# Patient Record
Sex: Male | Born: 1965
Health system: Southern US, Community
[De-identification: ages and names within clinical notes are randomized; demographics above are authoritative.]

## PROBLEM LIST (undated history)

## (undated) DIAGNOSIS — H4010X0 Unspecified open-angle glaucoma, stage unspecified: Secondary | ICD-10-CM

## (undated) DIAGNOSIS — L718 Other rosacea: Secondary | ICD-10-CM

## (undated) DIAGNOSIS — K4020 Bilateral inguinal hernia, without obstruction or gangrene, not specified as recurrent: Secondary | ICD-10-CM

## (undated) DIAGNOSIS — F79 Unspecified intellectual disabilities: Secondary | ICD-10-CM

## (undated) DIAGNOSIS — E785 Hyperlipidemia, unspecified: Secondary | ICD-10-CM

## (undated) DIAGNOSIS — IMO0001 Reserved for inherently not codable concepts without codable children: Principal | ICD-10-CM

## (undated) DIAGNOSIS — E663 Overweight: Secondary | ICD-10-CM

## (undated) DIAGNOSIS — K219 Gastro-esophageal reflux disease without esophagitis: Secondary | ICD-10-CM

## (undated) DIAGNOSIS — I10 Essential (primary) hypertension: Secondary | ICD-10-CM

## (undated) HISTORY — DX: Hyperlipidemia, unspecified: E78.5

## (undated) HISTORY — DX: Unspecified open-angle glaucoma, stage unspecified: H40.10X0

## (undated) HISTORY — DX: Essential (primary) hypertension: I10

## (undated) HISTORY — DX: Unspecified intellectual disabilities: F79

## (undated) HISTORY — DX: Other rosacea: L71.8

## (undated) HISTORY — DX: Gastro-esophageal reflux disease without esophagitis: K21.9

## (undated) HISTORY — DX: Overweight: E66.3

## (undated) HISTORY — DX: Bilateral inguinal hernia, without obstruction or gangrene, not specified as recurrent: K40.20

## (undated) HISTORY — DX: Reserved for inherently not codable concepts without codable children: IMO0001

---
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
## 1999-10-31 ENCOUNTER — Encounter: Admission: RE | Admit: 1999-10-31 | Discharge: 1999-10-31 | Payer: Self-pay | Admitting: Internal Medicine

## 1999-11-03 ENCOUNTER — Encounter: Admission: RE | Admit: 1999-11-03 | Discharge: 1999-11-03 | Payer: Self-pay | Admitting: Internal Medicine

## 2003-08-04 ENCOUNTER — Encounter: Admission: RE | Admit: 2003-08-04 | Discharge: 2003-08-04 | Payer: Self-pay | Admitting: Internal Medicine

## 2003-09-07 ENCOUNTER — Encounter: Admission: RE | Admit: 2003-09-07 | Discharge: 2003-09-07 | Payer: Self-pay | Admitting: Internal Medicine

## 2003-09-21 ENCOUNTER — Encounter: Admission: RE | Admit: 2003-09-21 | Discharge: 2003-09-21 | Payer: Self-pay | Admitting: Internal Medicine

## 2003-10-05 ENCOUNTER — Encounter: Admission: RE | Admit: 2003-10-05 | Discharge: 2003-10-05 | Payer: Self-pay | Admitting: Internal Medicine

## 2003-12-15 ENCOUNTER — Encounter: Admission: RE | Admit: 2003-12-15 | Discharge: 2003-12-15 | Payer: Self-pay | Admitting: Internal Medicine

## 2003-12-18 ENCOUNTER — Encounter: Admission: RE | Admit: 2003-12-18 | Discharge: 2003-12-18 | Payer: Self-pay | Admitting: Internal Medicine

## 2004-01-04 ENCOUNTER — Encounter: Admission: RE | Admit: 2004-01-04 | Discharge: 2004-01-04 | Payer: Self-pay | Admitting: Internal Medicine

## 2004-01-19 ENCOUNTER — Encounter: Admission: RE | Admit: 2004-01-19 | Discharge: 2004-01-19 | Payer: Self-pay | Admitting: Internal Medicine

## 2004-03-28 ENCOUNTER — Encounter: Admission: RE | Admit: 2004-03-28 | Discharge: 2004-03-28 | Payer: Self-pay | Admitting: Internal Medicine

## 2004-05-16 ENCOUNTER — Encounter: Admission: RE | Admit: 2004-05-16 | Discharge: 2004-05-16 | Payer: Self-pay | Admitting: Internal Medicine

## 2005-01-23 ENCOUNTER — Ambulatory Visit: Payer: Self-pay | Admitting: Internal Medicine

## 2005-06-02 ENCOUNTER — Ambulatory Visit: Payer: Self-pay | Admitting: Internal Medicine

## 2005-06-30 ENCOUNTER — Ambulatory Visit: Payer: Self-pay | Admitting: Internal Medicine

## 2005-07-07 ENCOUNTER — Ambulatory Visit: Payer: Self-pay | Admitting: Internal Medicine

## 2005-07-17 ENCOUNTER — Ambulatory Visit: Payer: Self-pay | Admitting: Internal Medicine

## 2005-07-21 ENCOUNTER — Ambulatory Visit: Payer: Self-pay | Admitting: Internal Medicine

## 2005-09-22 ENCOUNTER — Encounter (INDEPENDENT_AMBULATORY_CARE_PROVIDER_SITE_OTHER): Payer: Self-pay | Admitting: Internal Medicine

## 2005-09-22 ENCOUNTER — Ambulatory Visit: Payer: Self-pay | Admitting: Internal Medicine

## 2006-04-24 ENCOUNTER — Ambulatory Visit: Payer: Self-pay | Admitting: Internal Medicine

## 2006-08-18 DIAGNOSIS — E1129 Type 2 diabetes mellitus with other diabetic kidney complication: Secondary | ICD-10-CM | POA: Insufficient documentation

## 2006-08-18 DIAGNOSIS — I10 Essential (primary) hypertension: Secondary | ICD-10-CM

## 2006-08-18 DIAGNOSIS — IMO0001 Reserved for inherently not codable concepts without codable children: Secondary | ICD-10-CM

## 2006-08-18 DIAGNOSIS — K4020 Bilateral inguinal hernia, without obstruction or gangrene, not specified as recurrent: Secondary | ICD-10-CM | POA: Insufficient documentation

## 2006-08-18 DIAGNOSIS — R809 Proteinuria, unspecified: Secondary | ICD-10-CM

## 2006-08-18 DIAGNOSIS — F79 Unspecified intellectual disabilities: Secondary | ICD-10-CM

## 2006-08-18 HISTORY — DX: Essential (primary) hypertension: I10

## 2006-08-18 HISTORY — DX: Unspecified intellectual disabilities: F79

## 2006-08-18 HISTORY — DX: Reserved for inherently not codable concepts without codable children: IMO0001

## 2006-11-16 ENCOUNTER — Telehealth: Payer: Self-pay | Admitting: *Deleted

## 2006-11-19 ENCOUNTER — Ambulatory Visit: Payer: Self-pay | Admitting: Hospitalist

## 2006-11-19 LAB — CONVERTED CEMR LAB
Blood Glucose, Fingerstick: 125
Glucose, Bld: 125 mg/dL
Hgb A1c MFr Bld: 6.4 %

## 2006-12-24 ENCOUNTER — Telehealth: Payer: Self-pay | Admitting: *Deleted

## 2006-12-26 ENCOUNTER — Encounter (INDEPENDENT_AMBULATORY_CARE_PROVIDER_SITE_OTHER): Payer: Self-pay | Admitting: Internal Medicine

## 2006-12-26 ENCOUNTER — Ambulatory Visit: Payer: Self-pay | Admitting: *Deleted

## 2006-12-26 LAB — CONVERTED CEMR LAB: Cholesterol: 141 mg/dL (ref 0–200)

## 2007-02-25 ENCOUNTER — Telehealth (INDEPENDENT_AMBULATORY_CARE_PROVIDER_SITE_OTHER): Payer: Self-pay | Admitting: *Deleted

## 2007-03-08 ENCOUNTER — Ambulatory Visit: Payer: Self-pay | Admitting: Hospitalist

## 2007-03-08 ENCOUNTER — Encounter (INDEPENDENT_AMBULATORY_CARE_PROVIDER_SITE_OTHER): Payer: Self-pay | Admitting: *Deleted

## 2007-03-08 LAB — CONVERTED CEMR LAB
BUN: 14 mg/dL (ref 6–23)
CO2: 21 meq/L (ref 19–32)
Chloride: 106 meq/L (ref 96–112)
Glucose, Bld: 148 mg/dL — ABNORMAL HIGH (ref 70–99)
Potassium: 4.8 meq/L (ref 3.5–5.3)
Sodium: 138 meq/L (ref 135–145)

## 2007-04-17 ENCOUNTER — Telehealth: Payer: Self-pay | Admitting: *Deleted

## 2007-05-03 ENCOUNTER — Ambulatory Visit: Payer: Self-pay | Admitting: Internal Medicine

## 2007-05-03 DIAGNOSIS — E785 Hyperlipidemia, unspecified: Secondary | ICD-10-CM

## 2007-05-03 HISTORY — DX: Hyperlipidemia, unspecified: E78.5

## 2007-05-03 LAB — CONVERTED CEMR LAB: Cholesterol, target level: 200 mg/dL

## 2007-05-20 ENCOUNTER — Telehealth (INDEPENDENT_AMBULATORY_CARE_PROVIDER_SITE_OTHER): Payer: Self-pay | Admitting: *Deleted

## 2007-05-20 ENCOUNTER — Ambulatory Visit: Payer: Self-pay | Admitting: Hospitalist

## 2007-05-20 LAB — CONVERTED CEMR LAB

## 2007-05-31 ENCOUNTER — Telehealth (INDEPENDENT_AMBULATORY_CARE_PROVIDER_SITE_OTHER): Payer: Self-pay | Admitting: Pharmacy Technician

## 2007-07-01 ENCOUNTER — Telehealth: Payer: Self-pay | Admitting: *Deleted

## 2007-07-26 ENCOUNTER — Telehealth: Payer: Self-pay | Admitting: *Deleted

## 2007-07-29 ENCOUNTER — Encounter (INDEPENDENT_AMBULATORY_CARE_PROVIDER_SITE_OTHER): Payer: Self-pay | Admitting: Internal Medicine

## 2007-07-29 ENCOUNTER — Ambulatory Visit: Payer: Self-pay | Admitting: *Deleted

## 2007-07-29 LAB — CONVERTED CEMR LAB
ALT: 44 units/L (ref 0–53)
AST: 21 units/L (ref 0–37)
Albumin: 4.6 g/dL (ref 3.5–5.2)
Alkaline Phosphatase: 44 units/L (ref 39–117)
BUN: 14 mg/dL (ref 6–23)
Potassium: 4.3 meq/L (ref 3.5–5.3)
Sodium: 137 meq/L (ref 135–145)

## 2007-08-05 ENCOUNTER — Ambulatory Visit: Payer: Self-pay | Admitting: Infectious Diseases

## 2007-09-30 ENCOUNTER — Telehealth: Payer: Self-pay | Admitting: *Deleted

## 2007-10-30 ENCOUNTER — Telehealth (INDEPENDENT_AMBULATORY_CARE_PROVIDER_SITE_OTHER): Payer: Self-pay | Admitting: Internal Medicine

## 2008-03-12 ENCOUNTER — Ambulatory Visit: Payer: Self-pay | Admitting: Internal Medicine

## 2008-03-12 ENCOUNTER — Encounter: Payer: Self-pay | Admitting: Internal Medicine

## 2008-03-12 LAB — CONVERTED CEMR LAB
AST: 24 units/L (ref 0–37)
Albumin: 4.7 g/dL (ref 3.5–5.2)
Alkaline Phosphatase: 47 units/L (ref 39–117)
Chloride: 99 meq/L (ref 96–112)
Glucose, Bld: 154 mg/dL — ABNORMAL HIGH (ref 70–99)
LDL Cholesterol: 36 mg/dL (ref 0–99)
Microalb, Ur: 1.82 mg/dL (ref 0.00–1.89)
Potassium: 4.5 meq/L (ref 3.5–5.3)
Sodium: 136 meq/L (ref 135–145)
Total Protein: 7.6 g/dL (ref 6.0–8.3)

## 2008-04-24 ENCOUNTER — Encounter (INDEPENDENT_AMBULATORY_CARE_PROVIDER_SITE_OTHER): Payer: Self-pay | Admitting: *Deleted

## 2008-05-01 ENCOUNTER — Telehealth (INDEPENDENT_AMBULATORY_CARE_PROVIDER_SITE_OTHER): Payer: Self-pay | Admitting: Internal Medicine

## 2008-07-17 ENCOUNTER — Encounter (INDEPENDENT_AMBULATORY_CARE_PROVIDER_SITE_OTHER): Payer: Self-pay | Admitting: Internal Medicine

## 2008-07-17 ENCOUNTER — Ambulatory Visit: Payer: Self-pay | Admitting: Internal Medicine

## 2008-07-17 LAB — CONVERTED CEMR LAB: Hgb A1c MFr Bld: 5.3 %

## 2009-02-25 ENCOUNTER — Ambulatory Visit: Payer: Self-pay | Admitting: Internal Medicine

## 2009-02-25 LAB — CONVERTED CEMR LAB
Blood Glucose, AC Bkfst: 105 mg/dL
Hgb A1c MFr Bld: 6 %

## 2009-06-10 ENCOUNTER — Encounter: Payer: Self-pay | Admitting: Internal Medicine

## 2009-12-06 ENCOUNTER — Ambulatory Visit: Payer: Self-pay | Admitting: Internal Medicine

## 2009-12-06 LAB — CONVERTED CEMR LAB
BUN: 12 mg/dL (ref 6–23)
Bilirubin, Direct: 0.2 mg/dL (ref 0.0–0.3)
CO2: 26 meq/L (ref 19–32)
Chloride: 99 meq/L (ref 96–112)
Glucose, Bld: 105 mg/dL — ABNORMAL HIGH (ref 70–99)
Indirect Bilirubin: 0.5 mg/dL (ref 0.0–0.9)
LDL Cholesterol: 32 mg/dL (ref 0–99)
Potassium: 4.6 meq/L (ref 3.5–5.3)
Total Protein: 8.2 g/dL (ref 6.0–8.3)
VLDL: 37 mg/dL (ref 0–40)

## 2009-12-07 ENCOUNTER — Encounter: Payer: Self-pay | Admitting: Internal Medicine

## 2010-01-16 ENCOUNTER — Emergency Department (HOSPITAL_COMMUNITY): Admission: EM | Admit: 2010-01-16 | Discharge: 2010-01-17 | Payer: Self-pay | Admitting: Emergency Medicine

## 2010-01-24 ENCOUNTER — Ambulatory Visit: Payer: Self-pay | Admitting: Internal Medicine

## 2010-01-24 LAB — CONVERTED CEMR LAB: Blood Glucose, AC Bkfst: 209 mg/dL

## 2010-05-04 ENCOUNTER — Encounter: Payer: Self-pay | Admitting: Internal Medicine

## 2010-05-09 ENCOUNTER — Ambulatory Visit: Payer: Self-pay | Admitting: Internal Medicine

## 2010-05-09 ENCOUNTER — Encounter: Payer: Self-pay | Admitting: Internal Medicine

## 2010-05-09 ENCOUNTER — Ambulatory Visit (HOSPITAL_COMMUNITY): Admission: RE | Admit: 2010-05-09 | Discharge: 2010-05-09 | Payer: Self-pay | Admitting: Internal Medicine

## 2010-05-09 LAB — CONVERTED CEMR LAB
AST: 19 units/L (ref 0–37)
Albumin: 4.8 g/dL (ref 3.5–5.2)
Alkaline Phosphatase: 56 units/L (ref 39–117)
BUN: 15 mg/dL (ref 6–23)
Free T4: 1.27 ng/dL (ref 0.80–1.80)
HCT: 48.2 % (ref 39.0–52.0)
Hgb A1c MFr Bld: 6.7 %
Lymphocytes Relative: 17 % (ref 12–46)
Lymphs Abs: 1.5 10*3/uL (ref 0.7–4.0)
MCV: 86.5 fL (ref >=78.0)
Monocytes Relative: 9 % (ref 3–12)
Neutrophils Relative %: 74 % (ref 43–77)
Platelets: 171 10*3/uL (ref 150–400)
Potassium: 4 meq/L (ref 3.5–5.3)
Sodium: 138 meq/L (ref 135–145)
TSH: 1.456 microintl units/mL (ref 0.350–4.5)

## 2010-05-21 ENCOUNTER — Encounter: Payer: Self-pay | Admitting: Internal Medicine

## 2010-05-23 ENCOUNTER — Ambulatory Visit: Payer: Self-pay | Admitting: Internal Medicine

## 2010-06-16 ENCOUNTER — Encounter: Payer: Self-pay | Admitting: Internal Medicine

## 2010-08-29 ENCOUNTER — Encounter: Payer: Self-pay | Admitting: Internal Medicine

## 2010-08-29 ENCOUNTER — Ambulatory Visit: Payer: Self-pay | Admitting: Internal Medicine

## 2010-08-29 LAB — CONVERTED CEMR LAB
Blood Glucose, Fingerstick: 139
Hgb A1c MFr Bld: 6.6 %

## 2010-09-26 ENCOUNTER — Ambulatory Visit: Payer: Self-pay | Admitting: Internal Medicine

## 2010-09-26 LAB — CONVERTED CEMR LAB: Sed Rate: 4 mm/hr (ref 0–16)

## 2010-11-22 NOTE — Assessment & Plan Note (Signed)
Summary: Upper lip swollen from cheek/pcp-tobbia/hla   Vital Signs:  Patient profile:   45 year old male Height:      62 inches (157.48 cm) Weight:      147.1 pounds (66.86 kg) BMI:     27.00 Temp:     96.9 degrees F Pulse rate:   104 / minute BP sitting:   115 / 80  (left arm) Cuff size:   regular  Vitals Entered By: Dorie Rank RN (August 29, 2010 1:38 PM) CC: swelling right side of face started Sat. night,then got better, then upper lip swollen Sun. night - no tongue swelling - no trouble breathing or swallowing - no known injury -  Is Patient Diabetic? Yes Did you bring your meter with you today? Yes Pain Assessment Patient in pain? no      Nutritional Status BMI of 25 - 29 = overweight CBG Result 139  Have you ever been in a relationship where you felt threatened, hurt or afraid?Unable to ask  Domestic Violence Intervention family at side  Does patient need assistance? Functional Status Self care Ambulation Normal Comments able to feed and dress self - needs asst transportation and some ADL due to mental status   Diabetic Foot Exam Foot Inspection Is there a history of a foot ulcer?              No Is there a foot ulcer now?              No Can the patient see the bottom of their feet?          Yes Are the shoes appropriate in style and fit?          Yes Is there swelling or an abnormal foot shape?          No Are the toenails long?                Yes Are the toenails thick?                No Are the toenails ingrown?              No Is there heavy callous build-up?              No Is there pain in the calf muscle (Intermittent claudication) when walking?    NoIs there a claw toe deformity?              No Is there elevated skin temperature?            No Is there limited ankle dorsiflexion?            No Is there foot or ankle muscle weakness?            No  Diabetic Foot Care Education Patient educated on appropriate care of diabetic feet.  Pulse Check           Right Foot          Left Foot Dorsalis Pedis:        normal            normal Comments: mother and father help with foot care - only 4th toenail each foot curling  over tip of toes - family states they will soak feet and clip nails as needed High Risk Feet? No   10-g (5.07) Semmes-Weinstein Monofilament Test Performed by: Dorie Rank RN          Right Foot  Left Foot Visual Inspection     normal           normal Site 1         normal         normal Site 2         normal         normal Site 3         normal         normal Site 4         normal         normal Site 5         normal         normal Site 6         normal         normal Site 9         normal         normal  Impression      normal         normal  Legend:  Site 1 = Plantar aspect of first toe (center of pad) Site 2 = Plantar aspect of third toe (center of pad) Site 3 = Plantar aspect of fifth toe (center of pad) Site 4 = Plantar aspect of first metatarsal head Site 5 = Plantar aspect of third metatarsal head Site 6 = Plantar aspect of fifth metatarsal head Site 7 = Plantar aspect of medial midfoot Site 8 = Plantar aspect of lateral midfoot Site 9 = Plantar aspect of heel Site 10 = dorsal aspect of foot between the base of the first and second toes   Result is Abnormal if patient was unable to perceive the monofilament at site indicated.    Primary Care Provider:  Darnelle Maffucci MD  CC:  swelling right side of face started Sat. night, then got better, and then upper lip swollen Sun. night - no tongue swelling - no trouble breathing or swallowing - no known injury - .  History of Present Illness: Pt is a 45 year old male with past medical history of diabetes, type II,hypertension and allergy who came here for f/u. He had right facial and tongue swelling which started 2 days ago, no SOB or dizziness, CP, fever. Denies any bug biting, but only rembers that he ate apple before this happened. Now it is back  to normal w/o taking any meds, no any c/o. His CBG usually runs about 100. No abdominal pain or diarrhea, dysuria. Denies smoking or ETOH.    Preventive Screening-Counseling & Management  Alcohol-Tobacco     Smoking Status: never     Packs/Day: 6 [employer]IG[employer] A DAY     Passive Smoke Exposure: no  Caffeine-Diet-Exercise     Caffeine use/day: 1     Does Patient Exercise: yes     Type of exercise: walking     Times/week: 3  Problems Prior to Update: 1)  Tachycardia  (ICD-785.0) 2)  Angioedema  (ICD-995.1) 3)  Aftercare, Long-term Use, Medications Nec  (ICD-V58.69) 4)  Dyslipidemia  (ICD-272.4) 5)  Family History Diabetes 1st Degree Relative  (ICD-V18.0) 6)  Inguinal Hernias, Bilateral  (ICD-550.92) 7)  Mental Retardation  (ICD-319) 8)  Hypertension  (ICD-401.9) 9)  Diabetes Mellitus, Type II  (ICD-250.00)  Medications Prior to Update: 1)  Metformin Hcl 1000 Mg Tabs (Metformin Hcl) .... Take 1 Tablet By Mouth Two Times A Day 2)  Hydrochlorothiazide 12.5 Mg  Caps (Hydrochlorothiazide) .... Take 1 Tablet By Mouth Once A Day 3)  Zocor 40 Mg  Tabs (Simvastatin) .... Take 1 Tablet By Mouth Once A Day At Bedtime 4)  Anacin 81 Mg  Tbec (Aspirin) .... Take 1 Tablet By Mouth Once A Day 5)  Truetrack Test   Strp (Glucose Blood) .... To Test Blood Sugar 2-3x/week 6)  Bd Ultra-Fine 33 Lancets   Misc (Lancets) .... To Test Blood Sugar 2-3x/week 7)  Glipizide 5 Mg Tabs (Glipizide) .... Take 1 Tablet By Mouth Once A Day  Current Medications (verified): 1)  Metformin Hcl 1000 Mg Tabs (Metformin Hcl) .... Take 1 Tablet By Mouth Two Times A Day 2)  Hydrochlorothiazide 12.5 Mg  Caps (Hydrochlorothiazide) .... Take 1 Tablet By Mouth Once A Day 3)  Zocor 40 Mg  Tabs (Simvastatin) .... Take 1 Tablet By Mouth Once A Day At Bedtime 4)  Anacin 81 Mg  Tbec (Aspirin) .... Take 1 Tablet By Mouth Once A Day 5)  Truetrack Test   Strp (Glucose Blood) .... To Test Blood Sugar 2-3x/week 6)  Bd Ultra-Fine 33 Lancets    Misc (Lancets) .... To Test Blood Sugar 2-3x/week 7)  Glipizide 5 Mg Tabs (Glipizide) .... Take 1 Tablet By Mouth Once A Day  Allergies (verified): 1)  ! Amoxicillin 2)  ! Augmentin 3)  ! Ace Inhibitors  Past History:  Past Medical History: Last updated: 08/18/2006 Diabetes mellitus, type II Hypertension Mental retardation Inguinal hernia,bilateral  Family History: Last updated: 11/19/2006 Family History Diabetes 1st degree relative  Social History: Last updated: 03/12/2008 Single Lives with sisters  Risk Factors: Caffeine Use: 1 (08/29/2010) Exercise: yes (08/29/2010)  Risk Factors: Smoking Status: never (08/29/2010) Packs/Day: 6 CIG[employer] A DAY (08/29/2010) Passive Smoke Exposure: no (08/29/2010)  Family History: Reviewed history from 11/19/2006 and no changes required. Family History Diabetes 1st degree relative  Social History: Reviewed history from 03/12/2008 and no changes required. Single Lives with sisters  Review of Systems  The patient denies fever, vision loss, chest pain, syncope, dyspnea on exertion, peripheral edema, hemoptysis, abdominal pain, melena, and hematochezia.    Physical Exam  General:  alert, well-developed, well-nourished, and well-hydrated.   Head:  No facial swelling or redness, tenderness. normocephalic.   Nose:  no nasal discharge.   Mouth:  pharynx pink and moist.   Neck:  supple.   Lungs:  normal respiratory effort, normal breath sounds, no crackles, and no wheezes.   Heart:  normal rate, regular rhythm, no murmur, and no JVD.   Abdomen:  soft, non-tender, normal bowel sounds, no distention, and no masses.   Msk:  normal ROM, no joint tenderness, no joint swelling, and no joint warmth.   Pulses:  2+ Extremities:  No edema.  Neurologic:  alert & oriented X3, cranial nerves II-XII intact, strength normal in all extremities, sensation intact to light touch, gait normal, and DTRs symmetrical and normal.    Diabetes Management  Exam:    Foot Exam (with socks and/or shoes not present):       Sensory-Monofilament:          Left foot: normal          Right foot: normal   Impression & Recommendations:  Problem # 1:  ANGIOEDEMA (ICD-995.1) Assessment Improved His facial and tongue swelling is likely due to allergic reation to some unknown substance. He denies any new meds, apple was the only food before he had this swelling. Advised to be careful with the food which may be allergic and can use benadryl as needed. If has SOB with  low BP, needs to go to ED.   Problem # 2:  DIABETES MELLITUS, TYPE II (ICD-250.00) Assessment: Unchanged His A1C at target and will continue the current regimen.  His updated medication list for this problem includes:    Metformin Hcl 1000 Mg Tabs (Metformin hcl) .Marland Kitchen... Take 1 tablet by mouth two times a day    Anacin 81 Mg Tbec (Aspirin) .Marland Kitchen... Take 1 tablet by mouth once a day    Glipizide 5 Mg Tabs (Glipizide) .Marland Kitchen... Take 1 tablet by mouth once a day  Orders: T-Hgb A1C (in-house) (46962XB) T- Capillary Blood Glucose (28413)  Labs Reviewed: Creat: 0.97 (05/09/2010)     Last Eye Exam: No diabetic retinopathy.    (06/16/2010) Reviewed HgBA1c results: 6.6 (08/29/2010)  6.7 (05/09/2010)  His updated medication list for this problem includes:    Metformin Hcl 1000 Mg Tabs (Metformin hcl) .Marland Kitchen... Take 1 tablet by mouth two times a day    Anacin 81 Mg Tbec (Aspirin) .Marland Kitchen... Take 1 tablet by mouth once a day    Glipizide 5 Mg Tabs (Glipizide) .Marland Kitchen... Take 1 tablet by mouth once a day  Problem # 3:  DYSLIPIDEMIA (ICD-272.4) Assessment: Unchanged Will continue zocor and will recheck FLP at next visit.  His updated medication list for this problem includes:    Zocor 40 Mg Tabs (Simvastatin) .Marland Kitchen... Take 1 tablet by mouth once a day at bedtime  His updated medication list for this problem includes:    Zocor 40 Mg Tabs (Simvastatin) .Marland Kitchen... Take 1 tablet by mouth once a day at bedtime  Labs  Reviewed: SGOT: 19 (05/09/2010)   SGPT: 30 (05/09/2010)  Lipid Goals: Chol Goal: 200 (05/03/2007)   HDL Goal: 40 (05/03/2007)   LDL Goal: 100 (05/03/2007)   TG Goal: 150 (05/03/2007)  Prior 10 Yr Risk Heart Disease: 9 % (05/03/2007)   HDL:29 (12/06/2009), 32 (03/12/2008)  LDL:32 (12/06/2009), 36 (03/12/2008)  Chol:98 (12/06/2009), 86 (03/12/2008)  Trig:184 (12/06/2009), 91 (03/12/2008)  Complete Medication List: 1)  Metformin Hcl 1000 Mg Tabs (Metformin hcl) .... Take 1 tablet by mouth two times a day 2)  Hydrochlorothiazide 12.5 Mg Caps (Hydrochlorothiazide) .... Take 1 tablet by mouth once a day 3)  Zocor 40 Mg Tabs (Simvastatin) .... Take 1 tablet by mouth once a day at bedtime 4)  Anacin 81 Mg Tbec (Aspirin) .... Take 1 tablet by mouth once a day 5)  Truetrack Test Strp (Glucose blood) .... To test blood sugar 2-3x/week 6)  Bd Ultra-fine 33 Lancets Misc (Lancets) .... To test blood sugar 2-3x/week 7)  Glipizide 5 Mg Tabs (Glipizide) .... Take 1 tablet by mouth once a day  Patient Instructions: 1)  Please schedule a follow-up appointment in 6 months. 2)  It is important that you exercise regularly at least 20 minutes 5 times a week. If you develop chest pain, have severe difficulty breathing, or feel very tired , stop exercising immediately and seek medical attention. 3)  You need to lose weight. Consider a lower calorie diet and regular exercise.    Orders Added: 1)  T-Hgb A1C (in-house) [83036QW] 2)  T- Capillary Blood Glucose [82948] 3)  Est. Patient Level IV [24401]     Prevention & Chronic Care Immunizations   Influenza vaccine: Fluvax 3+  (01/24/2010)    Tetanus booster: 01/24/2010: Td   Td booster deferral: Deferred  (12/06/2009)    Pneumococcal vaccine: Not documented  Other Screening   Smoking status: never  (08/29/2010)  Diabetes Mellitus   HgbA1C:  6.6  (08/29/2010)   HgbA1C action/deferral: Ordered  (08/29/2010)   Hemoglobin A1C due: 09/03/2007    Eye  exam: No diabetic retinopathy.     (06/16/2010)   Diabetic eye exam action/deferral: Not indicated  (12/06/2009)   Eye exam due: 06/2011    Foot exam: yes  (08/29/2010)   Foot exam action/deferral: Do today   High risk foot: No  (08/29/2010)   Foot care education: Done  (08/29/2010)    Urine microalbumin/creatinine ratio: 22.5  (12/06/2009)   Urine microalbumin action/deferral: Ordered    Diabetes flowsheet reviewed?: Yes   Progress toward A1C goal: At goal  Lipids   Total Cholesterol: 98  (12/06/2009)   Lipid panel action/deferral: Lipid Panel ordered   LDL: 32  (12/06/2009)   LDL Direct: Not documented   HDL: 29  (12/06/2009)   Triglycerides: 184  (12/06/2009)   Lipid panel due: 12/26/2007    SGOT (AST): 19  (05/09/2010)   BMP action: Ordered   SGPT (ALT): 30  (05/09/2010)   Alkaline phosphatase: 56  (05/09/2010)   Total bilirubin: 0.9  (05/09/2010)    Lipid flowsheet reviewed?: Yes   Progress toward LDL goal: At goal  Hypertension   Last Blood Pressure: 115 / 80  (08/29/2010)   Serum creatinine: 0.97  (05/09/2010)   BMP action: Ordered   Serum potassium 4.0  (05/09/2010)    Hypertension flowsheet reviewed?: Yes   Progress toward BP goal: At goal  Self-Management Support :   Personal Goals (by the next clinic visit) :     Personal A1C goal: 7  (12/06/2009)     Personal blood pressure goal: 130/80  (12/06/2009)     Personal LDL goal: 70  (12/06/2009)    Patient will work on the following items until the next clinic visit to reach self-care goals:     Medications and monitoring: take my medicines every day, bring all of my medications to every visit  (08/29/2010)     Eating: drink diet soda or water instead of juice or soda, eat more vegetables, use fresh or frozen vegetables, eat foods that are low in salt, eat fruit for snacks and desserts  (08/29/2010)     Activity: take a 30 minute walk every day  (08/29/2010)    Diabetes self-management support: Pre-printed  educational material, Written self-care plan, Resources for patients handout  (08/29/2010)   Diabetes care plan printed   Last medical nutrition therapy: 05/20/2007    Hypertension self-management support: Written self-care plan, Resources for patients handout  (08/29/2010)   Hypertension self-care plan printed.    Lipid self-management support: Written self-care plan, Resources for patients handout  (08/29/2010)   Lipid self-care plan printed.      Resource handout printed.   Nursing Instructions: HgbA1C today (see order) CBG today (see order) Diabetic foot exam today    Laboratory Results   Blood Tests   Date/Time Received: August 29, 2010 1:59 PM  Date/Time Reported: Burke Keels  August 29, 2010 1:59 PM   HGBA1C: 6.6%   (Normal Range: Non-Diabetic - 3-6%   Control Diabetic - 6-8%) CBG Random:: 139mg /dL

## 2010-11-22 NOTE — Letter (Signed)
Summary: DIABETIC METER DOWNLOD 04-04-06/30/11  DIABETIC METER DOWNLOD 04-04-06/30/11   Imported By: Shon Hough 05/24/2010 14:02:29  _____________________________________________________________________  External Attachment:    Type:   Image     Comment:   External Document

## 2010-11-22 NOTE — Consult Note (Signed)
Summary: [facility]  [facility]   Imported By: Shon Hough 06/24/2010 10:44:38  _____________________________________________________________________  External Attachment:    Type:   Image     Comment:   External Document  Appended Document: [facility]   Diabetic Eye Exam  Procedure date:  06/16/2010  Findings:      No diabetic retinopathy.     Procedures Next Due Date:    Diabetic Eye Exam: 06/2011   Diabetic Eye Exam  Procedure date:  06/16/2010  Findings:      No diabetic retinopathy.     Procedures Next Due Date:    Diabetic Eye Exam: 06/2011

## 2010-11-22 NOTE — Assessment & Plan Note (Signed)
Summary: EST-PER DR BUTCHER/MEDICATION REFLLS/CFB   Vital Signs:  Patient profile:   45 year old male Height:      62.5 inches (158.75 cm) Weight:      149.8 pounds (68.09 kg) BMI:     27.06 Temp:     98.2 degrees F (36.78 degrees C) oral Pulse rate:   109 / minute BP sitting:   119 / 83  (right arm)  Vitals Entered By: Stanton Kidney Ditzler RN (December 06, 2009 4:12 PM) Is Patient Diabetic? Yes Did you bring your meter with you today? Yes Pain Assessment Patient in pain? no      Nutritional Status BMI of 25 - 29 = overweight Nutritional Status Detail appetite good CBG Result 103  Have you ever been in a relationship where you felt threatened, hurt or afraid?denies   Does patient need assistance? Functional Status Self care Ambulation Normal Comments Sister with pt. Ck-up - ? flu shot.   History of Present Illness: 45 y/o with PMH of HTN, HLP and DM II comes in for a regular follow up on his DM II and HTN last f/u 02/2009.  1) patient would like med refills and annual flu shot and screening.  he denies any CP,SOB,cough,AP,N,V,diarrhea or any other complaints.   Depression History:      The patient denies a depressed mood most of the day and a diminished interest in his usual daily activities.  The patient denies significant weight loss, significant weight gain, insomnia, hypersomnia, psychomotor agitation, psychomotor retardation, fatigue (loss of energy), feelings of worthlessness (guilt), impaired concentration (indecisiveness), and recurrent thoughts of death or suicide.         Preventive Screening-Counseling & Management  Alcohol-Tobacco     Smoking Status: never     Packs/Day: 6 [employer] A DAY     Passive Smoke Exposure: no  Caffeine-Diet-Exercise     Caffeine use/day: 1     Does Patient Exercise: yes     Type of exercise: walking     Times/week: 3  Current Medications (verified): 1)  Metformin Hcl 1000 Mg Tabs (Metformin Hcl) .... Take 1 Tablet By Mouth Two  Times A Day 2)  Enalapril Maleate 10 Mg Tabs (Enalapril Maleate) .... Take 1 Tablet By Mouth Once A Day 3)  Hydrochlorothiazide 12.5 Mg  Caps (Hydrochlorothiazide) .... Take 1 Tablet By Mouth Once A Day 4)  Zocor 40 Mg  Tabs (Simvastatin) .... Take 1 Tablet By Mouth Once A Day At Bedtime 5)  Anacin 81 Mg  Tbec (Aspirin) .... Take 1 Tablet By Mouth Once A Day 6)  Truetrack Test   Strp (Glucose Blood) .... To Test Blood Sugar 2-3x/week 7)  Bd Ultra-Fine 33 Lancets   Misc (Lancets) .... To Test Blood Sugar 2-3x/week 8)  Glipizide 2.5 Mg  Tb24 (Glipizide) .... Take 1 Tablet By Mouth Once A Day  Allergies: 1)  ! Amoxicillin 2)  ! Augmentin  Review of Systems       Patient is otherwise doing well, and denies any other complaints.    Physical Exam  General:  alert, well-developed, and cooperative to examination.    Neck:  supple, full ROM, no thyromegaly, no JVD, and no carotid bruits.    Lungs:  normal respiratory effort, no accessory muscle use, normal breath sounds, no crackles, and no wheezes.  Heart:  normal rate, regular rhythm, no murmur, no gallop, and no rub.    Abdomen:  soft, non-tender, normal bowel sounds, no distention, no guarding, no rebound  tenderness, no hepatomegaly, and no splenomegaly.    Msk:  . Extremities:  No cyanosis, clubbing, edema  Neurologic:  alert & oriented X3, cranial nerves II-XII intact, strength normal in all extremities, sensation intact to light touch, and gait normal.       Impression & Recommendations:  Problem # 1:  DYSLIPIDEMIA (ICD-272.4) will recheck FLP and LFT, and continue current meds for now.   His updated medication list for this problem includes:    Zocor 40 Mg Tabs (Simvastatin) .Marland Kitchen... Take 1 tablet by mouth once a day at bedtime  Orders: T-Lipid Profile [phone])  Labs Reviewed: SGOT: 24 (03/12/2008)   SGPT: 40 (03/12/2008)  Lipid Goals: Chol Goal: 200 (05/03/2007)   HDL Goal: 40 (05/03/2007)   LDL Goal: 100 (05/03/2007)    TG Goal: 150 (05/03/2007)  Prior 10 Yr Risk Heart Disease: 9 % (05/03/2007)   HDL:32 (03/12/2008), 29 (12/26/2006)  LDL:36 (03/12/2008), 81 (12/26/2006)  Chol:86 (03/12/2008), 141 (12/26/2006)  Trig:91 (03/12/2008), 157 (12/26/2006)  Problem # 2:  HYPERTENSION (ICD-401.9)  well controlled, continue current treatment plan.  His updated medication list for this problem includes:    Enalapril Maleate 10 Mg Tabs (Enalapril maleate) .Marland Kitchen... Take 1 tablet by mouth once a day    Hydrochlorothiazide 12.5 Mg Caps (Hydrochlorothiazide) .Marland Kitchen... Take 1 tablet by mouth once a day  Orders: T-Basic Metabolic Panel [id number])  BP today: 119/83 Prior BP: 121/84 (02/25/2009)  Prior 10 Yr Risk Heart Disease: 9 % (05/03/2007)  Labs Reviewed: K+: 4.5 (03/12/2008) Creat: : 1.01 (03/12/2008)   Chol: 86 (03/12/2008)   HDL: 32 (03/12/2008)   LDL: 36 (03/12/2008)   TG: 91 (03/12/2008)  Problem # 3:  DIABETES MELLITUS, TYPE II (ICD-250.00) a1c today 7.3, dm well controlled, i did not make any changes to his meds, but recommened diet changes.  if on next visit his a1c is > than now, will need to increase his current regiment  His updated medication list for this problem includes:    Metformin Hcl 1000 Mg Tabs (Metformin hcl) .Marland Kitchen... Take 1 tablet by mouth two times a day    Enalapril Maleate 10 Mg Tabs (Enalapril maleate) .Marland Kitchen... Take 1 tablet by mouth once a day    Anacin 81 Mg Tbec (Aspirin) .Marland Kitchen... Take 1 tablet by mouth once a day    Glipizide 2.5 Mg Tb24 (Glipizide) .Marland Kitchen... Take 1 tablet by mouth once a day  Orders: T- Capillary Blood Glucose ([id number]) T-Hgb A1C (in-house) ([id number]) T-Urine Microalbumin w/creat. ratio [phone])  Problem # 4:  Preventive Health Care (ICD-V70.0) checked, will give flu shot today.   Complete Medication List: 1)  Metformin Hcl 1000 Mg Tabs (Metformin hcl) .... Take 1 tablet by mouth two times a day 2)  Enalapril Maleate 10 Mg Tabs (Enalapril maleate) .... Take 1  tablet by mouth once a day 3)  Hydrochlorothiazide 12.5 Mg Caps (Hydrochlorothiazide) .... Take 1 tablet by mouth once a day 4)  Zocor 40 Mg Tabs (Simvastatin) .... Take 1 tablet by mouth once a day at bedtime 5)  Anacin 81 Mg Tbec (Aspirin) .... Take 1 tablet by mouth once a day 6)  Truetrack Test Strp (Glucose blood) .... To test blood sugar 2-3x/week 7)  Bd Ultra-fine 33 Lancets Misc (Lancets) .... To test blood sugar 2-3x/week 8)  Glipizide 2.5 Mg Tb24 (Glipizide) .... Take 1 tablet by mouth once a day  Other Orders: T-Hepatic Function [id number])  Patient Instructions: 1)  Please schedule a follow-up appointment in 1 year. Prescriptions:  GLIPIZIDE 2.5 MG  TB24 (GLIPIZIDE) Take 1 tablet by mouth once a day  #31 Tablet x 10   Entered and Authorized by:   Darnelle Maffucci MD   Signed by:   Darnelle Maffucci MD on 12/06/2009   Method used:   Electronically to        [facility]  [address] [phone]* (retail)       [address]       [place], [place]  [address]       Ph: [phone] or [phone]       Fax: [phone]   RxID:   [id number] ZOCOR 40 MG  TABS (SIMVASTATIN) Take 1 tablet by mouth once a day at bedtime  #30 x 11   Entered and Authorized by:   Darnelle Maffucci MD   Signed by:   Darnelle Maffucci MD on 12/06/2009   Method used:   Electronically to        [facility]  [address] [phone]* (retail)       [address]       [place], [place]  [address]       Ph: [phone] or [phone]       Fax: [phone]   RxID:   [id number] HYDROCHLOROTHIAZIDE 12.5 MG  CAPS (HYDROCHLOROTHIAZIDE) Take 1 tablet by mouth once a day  #30 x 11   Entered and Authorized by:   Darnelle Maffucci MD   Signed by:   Darnelle Maffucci MD on 12/06/2009   Method used:   Electronically to        [facility]  [address] [phone]* (retail)       [address]       [place], [place]  [address]       Ph: [phone] or [phone]       Fax: [phone]   RxID:    [id number] ENALAPRIL MALEATE 10 MG TABS (ENALAPRIL MALEATE) Take 1 tablet by mouth once a day  #31 x 5   Entered and Authorized by:   Darnelle Maffucci MD   Signed by:   Darnelle Maffucci MD on 12/06/2009   Method used:   Electronically to        [facility]  [address] [phone]* (retail)       [address]       [place], [place]  [address]       Ph: [phone] or [phone]       Fax: [phone]   RxID:   [id number]  Process Orders Check Orders Results:     [facility]: Check successful Order queued for requisitioning for [facility]: December 06, 2009 4:34 PM  Tests Sent for requisitioning (December 06, 2009 4:34 PM):     12/06/2009: [facility] -- T-Urine Microalbumin w/creat. ratio [[id number]] (signed)     12/06/2009: [facility] -- T-Lipid Profile [phone] (signed)     12/06/2009: [facility] -- T-Hepatic Function [phone] (signed)     12/06/2009: [facility] -- T-Basic Metabolic Panel [phone] (signed)    Prevention & Chronic Care Immunizations   Influenza vaccine: Fluvax 3+  (07/17/2008)    Tetanus booster: Not documented   Td booster deferral: Deferred  (12/06/2009)    Pneumococcal vaccine: Not documented  Other Screening   Smoking status: never  (12/06/2009)  Diabetes Mellitus   HgbA1C: 7.3  (12/06/2009)   Hemoglobin A1C due: 09/03/2007    Eye exam: No diabetic retinopathy.     (04/24/2008)   Diabetic eye exam action/deferral: Not indicated  (12/06/2009)   Eye exam due: 04/2009    Foot exam: yes  (03/12/2008)   Foot exam action/deferral: Deferred   High risk foot: Not documented   Foot care education: Not documented    Urine microalbumin/creatinine ratio: 12.4  (03/12/2008)   Urine microalbumin action/deferral: Ordered    Diabetes flowsheet reviewed?: Yes   Progress toward A1C goal: At goal  Lipids   Total Cholesterol: 86  (03/12/2008)    Lipid panel action/deferral: Lipid Panel ordered   LDL: 36  (03/12/2008)   LDL Direct: Not documented   HDL: 32  (03/12/2008)   Triglycerides: 91  (03/12/2008)   Lipid panel due: 12/26/2007    SGOT (AST): 24  (03/12/2008)   BMP action: Ordered   SGPT (ALT): 40  (03/12/2008)   Alkaline phosphatase: 47  (03/12/2008)   Total bilirubin: 0.7  (03/12/2008)    Lipid flowsheet reviewed?: Yes   Progress toward LDL goal: At goal  Hypertension   Last Blood Pressure: 119 / 83  (12/06/2009)   Serum creatinine: 1.01  (03/12/2008)   BMP action: Ordered   Serum potassium 4.5  (03/12/2008)    Hypertension flowsheet reviewed?: Yes   Progress toward BP goal: At goal  Self-Management Support :   Personal Goals (by the next clinic visit) :     Personal A1C goal: 7  (12/06/2009)     Personal blood pressure goal: 130/80  (12/06/2009)     Personal LDL goal: 70  (12/06/2009)    Patient will work on the following items until the next clinic visit to reach self-care goals:     Medications and monitoring: take my medicines every day, check my blood sugar, check my blood pressure, bring all of my medications to every visit, weigh myself weekly, examine my feet every day  (12/06/2009)     Eating: drink diet soda or water instead of juice or soda, eat more vegetables, use fresh or frozen vegetables, eat fruit for snacks and desserts, limit or avoid alcohol  (12/06/2009)     Activity: take a 30 minute walk every day  (12/06/2009)    Diabetes self-management support: Written self-care plan  (12/06/2009)   Diabetes care plan printed   Last medical nutrition therapy: 05/20/2007    Hypertension self-management support: Written self-care plan  (12/06/2009)   Hypertension self-care plan printed.    Lipid self-management support: Written self-care plan  (12/06/2009)   Lipid self-care plan printed.   Nursing Instructions: Give Flu vaccine today   Laboratory Results   Blood Tests   Date/Time Received:  December 06, 2009 4:37 PM Date/Time Reported: Alric Quan  December 06, 2009 4:37 PM   HGBA1C: 7.3%   (Normal Range: Non-Diabetic - 3-6%   Control Diabetic - 6-8%) CBG Random:: 103mg /dL

## 2010-11-22 NOTE — Letter (Signed)
Summary: METER DOWNLOAD  METER DOWNLOAD   Imported By: Shon Hough 05/16/2010 15:44:22  _____________________________________________________________________  External Attachment:    Type:   Image     Comment:   External Document

## 2010-11-22 NOTE — Assessment & Plan Note (Signed)
Summary: EST-3 MONTH F/U VISIT/CH   Vital Signs:  Patient profile:   45 year old male Height:      62 inches (157.48 cm) Weight:      148.1 pounds (67.32 kg) BMI:     27.19 Temp:     97.2 degrees F (36.22 degrees C) oral Pulse rate:   94 / minute BP sitting:   138 / 94  (left arm)  Vitals Entered By: Stanton Kidney Ditzler RN (September 26, 2010 1:31 PM) CC: Angioedema, DM management.  Is Patient Diabetic? Yes Did you bring your meter with you today? Yes Pain Assessment Patient in pain? no      Nutritional Status BMI of 25 - 29 = overweight Nutritional Status Detail appetite good  Have you ever been in a relationship where you felt threatened, hurt or afraid?denies   Does patient need assistance? Functional Status Self care Ambulation Normal Comments Sister with pt. FU - doing well.   Primary Care Provider:  Darnelle Maffucci MD  CC:  Angioedema and DM management. .  History of Present Illness: Pt is a 45 year old male with past medical history of diabetes, type II,hypertension and allergy who came here for f/u. He had right facial and tongue swelling one month ago, and one episode about 3 months ago which was thoughto be 2/2 to ACE and was d/c'd then, this latest episode is of unknown etiology, he has not had an episode since last month, however this remains concerning, no SOB or dizziness, CP, fever. Denies any bug biting, but only rembers that he ate apple before this happened. Now it is back to normal w/o taking any meds, no any c/o. His CBG usually runs about 100. No abdominal pain or diarrhea, dysuria. Denies smoking or ETOH.    Depression History:      The patient denies a depressed mood most of the day and a diminished interest in his usual daily activities.         Preventive Screening-Counseling & Management  Alcohol-Tobacco     Smoking Status: never     Packs/Day: 6 CIGA A DAY     Passive Smoke Exposure: no  Caffeine-Diet-Exercise     Caffeine use/day: 1     Does  Patient Exercise: yes     Type of exercise: walking     Times/week: 3  Current Medications (verified): 1)  Metformin Hcl 1000 Mg Tabs (Metformin Hcl) .... Take 1 Tablet By Mouth Two Times A Day 2)  Hydrochlorothiazide 12.5 Mg  Caps (Hydrochlorothiazide) .... Take 1 Tablet By Mouth Once A Day 3)  Zocor 40 Mg  Tabs (Simvastatin) .... Take 1 Tablet By Mouth Once A Day At Bedtime 4)  Anacin 81 Mg  Tbec (Aspirin) .... Take 1 Tablet By Mouth Once A Day 5)  Truetrack Test   Strp (Glucose Blood) .... To Test Blood Sugar 2-3x/week 6)  Bd Ultra-Fine 33 Lancets   Misc (Lancets) .... To Test Blood Sugar 2-3x/week 7)  Glipizide 5 Mg Tabs (Glipizide) .... Take 1 Tablet By Mouth Once A Day 8)  Metoprolol Tartrate 25 Mg Tabs (Metoprolol Tartrate) .... Take 1 Tablet By Mouth Two Times A Day  Allergies (verified): 1)  ! Amoxicillin 2)  ! Augmentin 3)  ! Ace Inhibitors  Review of Systems       Per HPI  Physical Exam  General:  alert, well-developed, well-nourished, and well-hydrated.   Head:  No facial swelling or redness, tenderness. normocephalic.   Mouth:  pharynx pink  and moist.   Lungs:  normal respiratory effort, normal breath sounds, no crackles, and no wheezes.   Heart:  normal rate, regular rhythm, no murmur, and no JVD.   Abdomen:  soft, non-tender, normal bowel sounds, no distention, and no masses.   Pulses:  2+ Extremities:  No edema.    Impression & Recommendations:  Problem # 1:  ANGIOEDEMA (ICD-995.1) Assessment Comment Only Unknown cause, last CMET, TSH and CBC were WNL, Will get C3 and C4 titers along with ESR and CRP.   Note that depressed C4 levels should prompt further evaluation for complement mediated angioedema, and low levels C3 and C4 levels suggest an immune complex mediated process, such as systemic lupus erythematosus.  Orders: T- * Misc. Laboratory test [phone]) T-Sed Rate (Automated) [id number]) T-C-Reactive Protein [id number])  Problem # 2:  DIABETES  MELLITUS, TYPE II (ICD-250.00) Assessment: Comment Only  Well controlled on current treatment, No new changes made today, Will continue to monitor.   His updated medication list for this problem includes:    Metformin Hcl 1000 Mg Tabs (Metformin hcl) .Marland Kitchen... Take 1 tablet by mouth two times a day    Anacin 81 Mg Tbec (Aspirin) .Marland Kitchen... Take 1 tablet by mouth once a day    Glipizide 5 Mg Tabs (Glipizide) .Marland Kitchen... Take 1 tablet by mouth once a day  Labs Reviewed: Creat: 0.97 (05/09/2010)     Last Eye Exam: No diabetic retinopathy.    (06/16/2010) Reviewed HgBA1c results: 6.6 (08/29/2010)  6.7 (05/09/2010)  Problem # 3:  HYPERTENSION (ICD-401.9) bp has been elevated since d/c of ACE will give  low dose metoprolol today. this will also help with his sinus tach (chronic)  His updated medication list for this problem includes:    Hydrochlorothiazide 12.5 Mg Caps (Hydrochlorothiazide) .Marland Kitchen... Take 1 tablet by mouth once a day    Metoprolol Tartrate 25 Mg Tabs (Metoprolol tartrate) .Marland Kitchen... Take 1 tablet by mouth two times a day  Problem # 4:  DYSLIPIDEMIA (ICD-272.4) Well controlled on current treatment, No new changes made today, Will continue to monitor.   His updated medication list for this problem includes:    Zocor 40 Mg Tabs (Simvastatin) .Marland Kitchen... Take 1 tablet by mouth once a day at bedtime  Labs Reviewed: SGOT: 19 (05/09/2010)   SGPT: 30 (05/09/2010)  Lipid Goals: Chol Goal: 200 (05/03/2007)   HDL Goal: 40 (05/03/2007)   LDL Goal: 100 (05/03/2007)   TG Goal: 150 (05/03/2007)  Prior 10 Yr Risk Heart Disease: 9 % (05/03/2007)   HDL:29 (12/06/2009), 32 (03/12/2008)  LDL:32 (12/06/2009), 36 (03/12/2008)  Chol:98 (12/06/2009), 86 (03/12/2008)  Trig:184 (12/06/2009), 91 (03/12/2008)  Complete Medication List: 1)  Metformin Hcl 1000 Mg Tabs (Metformin hcl) .... Take 1 tablet by mouth two times a day 2)  Hydrochlorothiazide 12.5 Mg Caps (Hydrochlorothiazide) .... Take 1 tablet by mouth once a  day 3)  Zocor 40 Mg Tabs (Simvastatin) .... Take 1 tablet by mouth once a day at bedtime 4)  Anacin 81 Mg Tbec (Aspirin) .... Take 1 tablet by mouth once a day 5)  Truetrack Test Strp (Glucose blood) .... To test blood sugar 2-3x/week 6)  Bd Ultra-fine 33 Lancets Misc (Lancets) .... To test blood sugar 2-3x/week 7)  Glipizide 5 Mg Tabs (Glipizide) .... Take 1 tablet by mouth once a day 8)  Metoprolol Tartrate 25 Mg Tabs (Metoprolol tartrate) .... Take 1 tablet by mouth two times a day  Other Orders: Influenza Vaccine MCR (44010)  Patient Instructions: 1)  Please schedule a follow-up appointment in 2 months. Prescriptions: METOPROLOL TARTRATE 25 MG TABS (METOPROLOL TARTRATE) Take 1 tablet by mouth two times a day  #60 x 1   Entered and Authorized by:   Darnelle Maffucci MD   Signed by:   Darnelle Maffucci MD on 09/26/2010   Method used:   Electronically to        [facility]  [address] [phone]* (retail)       [address]       [place], [place]  [address]       Ph: [phone] or [phone]       Fax: [phone]   RxID:   [id number]    Orders Added: 1)  T- * Misc. Laboratory test [99999] 2)  Est. Patient Level IV [99214] 3)  Influenza Vaccine MCR [00025] 4)  T-Sed Rate (Automated) [[id number]] 5)  T-C-Reactive Protein [[id number]]   Immunizations Administered:  Influenza Vaccine # 1:    Vaccine Type: Fluvax MCR    Site: left deltoid    Mfr: GlaxoSmithKline    Dose: 0.5 ml    Route: IM    Given by: Stanton Kidney Ditzler RN    Exp. Date: 04/22/2011    Lot #: [id number]    VIS given: 05/17/10 version given September 26, 2010.  Flu Vaccine Consent Questions:    Do you have a history of severe allergic reactions to this vaccine? no    Any prior history of allergic reactions to egg and/or gelatin? no    Do you have a sensitivity to the preservative Thimersol? no    Do you have a past history of Guillan-Barre Syndrome? no    Do you currently have an acute febrile  illness? no    Have you ever had a severe reaction to latex? no    Vaccine information given and explained to patient? yes   Immunizations Administered:  Influenza Vaccine # 1:    Vaccine Type: Fluvax MCR    Site: left deltoid    Mfr: GlaxoSmithKline    Dose: 0.5 ml    Route: IM    Given by: Stanton Kidney Ditzler RN    Exp. Date: 04/22/2011    Lot #: [id number]    VIS given: 05/17/10 version given September 26, 2010. Process Orders Check Orders Results:     [facility]: Order checked:     [phone] -- T- * Misc. Laboratory test -- No ABN rules found (CPT: ) Tests Sent for requisitioning (September 27, 2010 8:18 PM):     09/26/2010: [facility] -- T- * Misc. Laboratory test [99999] (signed)     09/26/2010: [facility] -- T-Sed Rate (Automated) [phone] (signed)     09/26/2010: [facility] -- T-C-Reactive Protein [phone] (signed)     Prevention & Chronic Care Immunizations   Influenza vaccine: Fluvax MCR  (09/26/2010)    Tetanus booster: 01/24/2010: Td   Td booster deferral: Deferred  (12/06/2009)    Pneumococcal vaccine: Not documented  Other Screening   Smoking status: never  (09/26/2010)  Diabetes Mellitus   HgbA1C: 6.6  (08/29/2010)   HgbA1C action/deferral: Ordered  (08/29/2010)   Hemoglobin A1C due: 09/03/2007    Eye exam: No diabetic retinopathy.     (06/16/2010)   Diabetic eye exam action/deferral: Not indicated  (12/06/2009)   Eye exam due: 06/2011    Foot exam: yes  (03/12/2008)   Foot exam action/deferral: Do today  High risk foot: No  (08/29/2010)   Foot care education: Done  (08/29/2010)    Urine microalbumin/creatinine ratio: 22.5  (12/06/2009)   Urine microalbumin action/deferral: Ordered    Diabetes flowsheet reviewed?: Yes   Progress toward A1C goal: At goal  Lipids   Total Cholesterol: 98  (12/06/2009)   Lipid panel action/deferral: Lipid Panel ordered   LDL: 32   (12/06/2009)   LDL Direct: Not documented   HDL: 29  (12/06/2009)   Triglycerides: 184  (12/06/2009)   Lipid panel due: 12/26/2007    SGOT (AST): 19  (05/09/2010)   BMP action: Ordered   SGPT (ALT): 30  (05/09/2010)   Alkaline phosphatase: 56  (05/09/2010)   Total bilirubin: 0.9  (05/09/2010)    Lipid flowsheet reviewed?: Yes   Progress toward LDL goal: At goal  Hypertension   Last Blood Pressure: 138 / 94  (09/26/2010)   Serum creatinine: 0.97  (05/09/2010)   BMP action: Ordered   Serum potassium 4.0  (05/09/2010)    Hypertension flowsheet reviewed?: Yes   Progress toward BP goal: Deteriorated  Self-Management Support :   Personal Goals (by the next clinic visit) :     Personal A1C goal: 7  (12/06/2009)     Personal blood pressure goal: 130/80  (12/06/2009)     Personal LDL goal: 70  (12/06/2009)    Patient will work on the following items until the next clinic visit to reach self-care goals:     Medications and monitoring: take my medicines every day, check my blood sugar, check my blood pressure, bring all of my medications to every visit, weigh myself weekly, examine my feet every day  (09/26/2010)     Eating: drink diet soda or water instead of juice or soda, eat more vegetables, use fresh or frozen vegetables, eat fruit for snacks and desserts, limit or avoid alcohol  (09/26/2010)     Activity: take a 30 minute walk every day, park at the far end of the parking lot  (09/26/2010)    Diabetes self-management support: Copy of home glucose meter record, Written self-care plan, Education handout, Resources for patients handout  (09/26/2010)   Diabetes care plan printed   Diabetes education handout printed   Last medical nutrition therapy: 05/20/2007    Hypertension self-management support: Written self-care plan, Education handout, Resources for patients handout  (09/26/2010)   Hypertension self-care plan printed.   Hypertension education handout printed    Lipid  self-management support: Written self-care plan, Education handout, Resources for patients handout  (09/26/2010)   Lipid self-care plan printed.   Lipid education handout printed      Resource handout printed.   Nursing Instructions: Give Flu vaccine today   Process Orders Check Orders Results:     [facility]: Order checked:     [phone] -- T- * Misc. Laboratory test -- No ABN rules found (CPT: ) Tests Sent for requisitioning (September 27, 2010 8:18 PM):     09/26/2010: [facility] -- T- * Misc. Laboratory test [99999] (signed)     09/26/2010: [facility] -- T-Sed Rate (Automated) [phone] (signed)     09/26/2010: [facility] -- T-C-Reactive Protein [phone] (signed)

## 2010-11-22 NOTE — Assessment & Plan Note (Signed)
Summary: EST-CK/FU/MEDS/CFB   Vital Signs:  Patient profile:   45 year old male Height:      62.5 inches (158.75 cm) Weight:      146.4 pounds (66.55 kg) BMI:     26.45 Temp:     98.3 degrees F (36.83 degrees C) oral Pulse rate:   130 / minute BP sitting:   121 / 82  (right arm)  Vitals Entered By: Stanton Kidney Ditzler RN (May 09, 2010 4:09 PM) Is Patient Diabetic? Yes Did you bring your meter with you today? Yes Pain Assessment Patient in pain? no      Nutritional Status BMI of 25 - 29 = overweight Nutritional Status Detail appetite good CBG Result 148  Have you ever been in a relationship where you felt threatened, hurt or afraid?denies   Does patient need assistance? Functional Status Self care Ambulation Normal Comments Sister with pt. Ck-up.   History of Present Illness: 45 yo M with Past Medical History:  Diabetes mellitus, type II (last aic 7.3) Hypertension Mental retardation Inguinal hernia,bilateral  presents to opc for routine followup and a1c check  Patient is tachycardic today with rate of 130's, denies palpitation, diaphoresis.  Denies CP, SOB, and is otherwise doing well, and denies any other complaints.    Depression History:      The patient denies a depressed mood most of the day and a diminished interest in his usual daily activities.         Preventive Screening-Counseling & Management  Alcohol-Tobacco     Smoking Status: never     Packs/Day: 6 CIGA A DAY     Passive Smoke Exposure: no  Caffeine-Diet-Exercise     Caffeine use/day: 1     Does Patient Exercise: yes     Type of exercise: walking     Times/week: 3  Current Medications (verified): 1)  Metformin Hcl 1000 Mg Tabs (Metformin Hcl) .... Take 1 Tablet By Mouth Two Times A Day 2)  Hydrochlorothiazide 12.5 Mg  Caps (Hydrochlorothiazide) .... Take 1 Tablet By Mouth Once A Day 3)  Zocor 40 Mg  Tabs (Simvastatin) .... Take 1 Tablet By Mouth Once A Day At Bedtime 4)  Anacin 81 Mg  Tbec  (Aspirin) .... Take 1 Tablet By Mouth Once A Day 5)  Truetrack Test   Strp (Glucose Blood) .... To Test Blood Sugar 2-3x/week 6)  Bd Ultra-Fine 33 Lancets   Misc (Lancets) .... To Test Blood Sugar 2-3x/week 7)  Glipizide 5 Mg Tabs (Glipizide) .... Take 1 Tablet By Mouth Once A Day  Allergies: 1)  ! Amoxicillin 2)  ! Augmentin 3)  ! Ace Inhibitors  Review of Systems       Per HPI  Physical Exam  General:  alert, well-developed, and cooperative to examination.    Neck:  supple, full ROM, no thyromegaly, no JVD, and no carotid bruits.    Lungs:  normal respiratory effort, no accessory muscle use, normal breath sounds, no crackles, and no wheezes.  Heart:  tachycardic, regular rhythm, no murmur, no gallop, and no rub.    Abdomen:  soft, non-tender, normal bowel sounds, no distention, no guarding, no rebound tenderness, no hepatomegaly, and no splenomegaly.    Msk:  no joint swelling, no joint warmth, and no redness over joints.    Pulses:  2+ DP/PT pulses bilaterally  Extremities:  No cyanosis, clubbing, edema  Neurologic:  alert & oriented X3, cranial nerves II-XII intact, strength normal in all extremities, sensation intact to  light touch, and gait normal.       Impression & Recommendations:  Problem # 1:  TACHYCARDIA (ICD-785.0) Tachycardia: Sinus, with rate 110-130, looking  back on his records he has always had HR >100 but always less than 110. Main DDx inculde: fever, pain, drugs, hyperthyroidism, anemia. Patient was fasting so this might have contributed to his tachycardia.  will check routine labs and bring back in 2 weeks for recheck of HR.  Plan -EKG, TSH, UDS, CBC.   Orders: 12 Lead EKG (12 Lead EKG) T-CMP with Estimated GFR ([id number]) T-CBC w/Diff ([id number]) T-TSH ([id number]) T-T4, Free ([id number])  Problem # 2:  HYPERTENSION (ICD-401.9) Well controlled on current treatment, No new changes made today, Will continue to monitor.   His updated medication list  for this problem includes:    Hydrochlorothiazide 12.5 Mg Caps (Hydrochlorothiazide) .Marland Kitchen... Take 1 tablet by mouth once a day  BP today: 121/82 Prior BP: 131/87 (01/24/2010)  Prior 10 Yr Risk Heart Disease: 9 % (05/03/2007)  Labs Reviewed: K+: 4.6 (12/06/2009) Creat: : 0.91 (12/06/2009)   Chol: 98 (12/06/2009)   HDL: 29 (12/06/2009)   LDL: 32 (12/06/2009)   TG: 184 (12/06/2009)  Problem # 3:  DIABETES MELLITUS, TYPE II (ICD-250.00)  Well controlled on current treatment, No new changes made today, Will continue to monitor.   His updated medication list for this problem includes:    Metformin Hcl 1000 Mg Tabs (Metformin hcl) .Marland Kitchen... Take 1 tablet by mouth two times a day    Anacin 81 Mg Tbec (Aspirin) .Marland Kitchen... Take 1 tablet by mouth once a day    Glipizide 5 Mg Tabs (Glipizide) .Marland Kitchen... Take 1 tablet by mouth once a day  Orders: T- Capillary Blood Glucose (29518) T-Hgb A1C (in-house) ([id number])  Labs Reviewed: Creat: 0.91 (12/06/2009)     Last Eye Exam: No diabetic retinopathy.    (04/24/2008) Reviewed HgBA1c results: 6.7 (05/09/2010)  7.0 (01/24/2010)  Problem # 4:  DYSLIPIDEMIA (ICD-272.4) Well controlled on current treatment, No new changes made today, Will continue to monitor.   His updated medication list for this problem includes:    Zocor 40 Mg Tabs (Simvastatin) .Marland Kitchen... Take 1 tablet by mouth once a day at bedtime  Labs Reviewed: SGOT: 43 (12/06/2009)   SGPT: 63 (12/06/2009)  Lipid Goals: Chol Goal: 200 (05/03/2007)   HDL Goal: 40 (05/03/2007)   LDL Goal: 100 (05/03/2007)   TG Goal: 150 (05/03/2007)  Prior 10 Yr Risk Heart Disease: 9 % (05/03/2007)   HDL:29 (12/06/2009), 32 (03/12/2008)  LDL:32 (12/06/2009), 36 (03/12/2008)  Chol:98 (12/06/2009), 86 (03/12/2008)  Trig:184 (12/06/2009), 91 (03/12/2008)  Complete Medication List: 1)  Metformin Hcl 1000 Mg Tabs (Metformin hcl) .... Take 1 tablet by mouth two times a day 2)  Hydrochlorothiazide 12.5 Mg Caps  (Hydrochlorothiazide) .... Take 1 tablet by mouth once a day 3)  Zocor 40 Mg Tabs (Simvastatin) .... Take 1 tablet by mouth once a day at bedtime 4)  Anacin 81 Mg Tbec (Aspirin) .... Take 1 tablet by mouth once a day 5)  Truetrack Test Strp (Glucose blood) .... To test blood sugar 2-3x/week 6)  Bd Ultra-fine 33 Lancets Misc (Lancets) .... To test blood sugar 2-3x/week 7)  Glipizide 5 Mg Tabs (Glipizide) .... Take 1 tablet by mouth once a day  Patient Instructions: 1)  Please schedule a follow-up appointment in 2 weeks with Dr. Gilford Rile Process Orders Check Orders Results:     [facility]: Check successful Tests Sent for requisitioning (May 09, 2010 5:05 PM):     05/09/2010: [facility] -- T-CMP with Estimated GFR [[id number]] (signed)     05/09/2010: [facility] -- T-CBC w/Diff [[id number]] (signed)     05/09/2010: [facility] -- T-TSH [phone] (signed)     05/09/2010: [facility] -- [place], [place] [[id number]] (signed)    Prevention & Chronic Care Immunizations   Influenza vaccine: Fluvax 3+  (01/24/2010)    Tetanus booster: 01/24/2010: Td   Td booster deferral: Deferred  (12/06/2009)    Pneumococcal vaccine: Not documented  Other Screening   Smoking status: never  (05/09/2010)  Diabetes Mellitus   HgbA1C: 6.7  (05/09/2010)   HgbA1C action/deferral: Ordered  (01/24/2010)   Hemoglobin A1C due: 09/03/2007    Eye exam: No diabetic retinopathy.     (04/24/2008)   Diabetic eye exam action/deferral: Not indicated  (12/06/2009)   Eye exam due: 04/2009    Foot exam: yes  (03/12/2008)   Foot exam action/deferral: Deferred   High risk foot: Not documented   Foot care education: Not documented    Urine microalbumin/creatinine ratio: 22.5  (12/06/2009)   Urine microalbumin action/deferral: Ordered    Diabetes flowsheet reviewed?: Yes   Progress toward A1C goal: At goal  Lipids   Total  Cholesterol: 98  (12/06/2009)   Lipid panel action/deferral: Lipid Panel ordered   LDL: 32  (12/06/2009)   LDL Direct: Not documented   HDL: 29  (12/06/2009)   Triglycerides: 184  (12/06/2009)   Lipid panel due: 12/26/2007    SGOT (AST): 43  (12/06/2009)   BMP action: Ordered   SGPT (ALT): 63  (12/06/2009)   Alkaline phosphatase: 48  (12/06/2009)   Total bilirubin: 0.7  (12/06/2009)    Lipid flowsheet reviewed?: Yes   Progress toward LDL goal: At goal  Hypertension   Last Blood Pressure: 121 / 82  (05/09/2010)   Serum creatinine: 0.91  (12/06/2009)   BMP action: Ordered   Serum potassium 4.6  (12/06/2009)    Hypertension flowsheet reviewed?: Yes   Progress toward BP goal: At goal  Self-Management Support :   Personal Goals (by the next clinic visit) :     Personal A1C goal: 7  (12/06/2009)     Personal blood pressure goal: 130/80  (12/06/2009)     Personal LDL goal: 70  (12/06/2009)    Patient will work on the following items until the next clinic visit to reach self-care goals:     Medications and monitoring: take my medicines every day, check my blood sugar, check my blood pressure, bring all of my medications to every visit, weigh myself weekly  (05/09/2010)     Eating: drink diet soda or water instead of juice or soda, eat more vegetables, use fresh or frozen vegetables, eat fruit for snacks and desserts, limit or avoid alcohol  (05/09/2010)     Activity: take a 30 minute walk every day, park at the far end of the parking lot  (05/09/2010)    Diabetes self-management support: Written self-care plan, Education handout, Resources for patients handout  (05/09/2010)   Diabetes care plan printed   Diabetes education handout printed   Last medical nutrition therapy: 05/20/2007    Hypertension self-management support: Written self-care plan, Education handout, Resources for patients handout  (05/09/2010)   Hypertension self-care plan printed.   Hypertension education handout  printed    Lipid self-management support: Written self-care plan, Education handout, Resources for patients handout  (05/09/2010)   Lipid self-care plan printed.  Lipid education handout printed      Resource handout printed.  Laboratory Results   Blood Tests   Date/Time Received: May 09, 2010 4:30 PM  Date/Time Reported: Burke Keels  May 09, 2010 4:30 PM   HGBA1C: 6.7%   (Normal Range: Non-Diabetic - 3-6%   Control Diabetic - 6-8%) CBG Random:: 148mg /dL

## 2010-11-22 NOTE — Assessment & Plan Note (Signed)
Summary: EST-2 WEEK RECHECK/CH   Vital Signs:  Patient profile:   45 year old male Height:      62.5 inches (158.75 cm) Weight:      144.7 pounds (65.77 kg) BMI:     26.14 Temp:     97.9 degrees F (36.61 degrees C) oral Pulse rate:   96 / minute BP sitting:   118 / 80  (right arm)  Vitals Entered By: Stanton Kidney Ditzler RN (May 23, 2010 8:55 AM) Is Patient Diabetic? Yes Did you bring your meter with you today? Yes Pain Assessment Patient in pain? no      Nutritional Status BMI of 25 - 29 = overweight Nutritional Status Detail appetite good CBG Result 163  Have you ever been in a relationship where you felt threatened, hurt or afraid?denies   Does patient need assistance? Functional Status Self care Ambulation Normal Comments Sister with pt. FU - went to Urgent Care due to sinus infection - better.   History of Present Illness: this is a 45 year old male with past medical history diabetes, type II,hypertension  present outpatient clinic for two week follow upafter he had tachycardia on his last visit routine labs were checked and they were within normal limits.  Today.  His heart rate is below 100 no beds on prior visits.  Heart is always in the upper 90 and this is likely to be his baseline.today the patient's heart rate is 96.  The patient feels well and denies any new complaints.  He is up-to-date on all of his medications and has no problem with compliance.  The patient also reports that he had a sinus infection approximately 2 weeks ago after his follow-up with me.  where presented to urgent care.  They gave him amoxicillin and promethazine which cleared up the infection.   Pt denies fever, chills, or any other complaints, and otherwise doing well.   Depression History:      The patient denies a depressed mood most of the day and a diminished interest in his usual daily activities.         Preventive Screening-Counseling & Management  Alcohol-Tobacco     Smoking  Status: never     Packs/Day: 6 [employer] A DAY     Passive Smoke Exposure: no  Caffeine-Diet-Exercise     Caffeine use/day: 1     Does Patient Exercise: yes     Type of exercise: walking     Times/week: 3  Current Medications (verified): 1)  Metformin Hcl 1000 Mg Tabs (Metformin Hcl) .... Take 1 Tablet By Mouth Two Times A Day 2)  Hydrochlorothiazide 12.5 Mg  Caps (Hydrochlorothiazide) .... Take 1 Tablet By Mouth Once A Day 3)  Zocor 40 Mg  Tabs (Simvastatin) .... Take 1 Tablet By Mouth Once A Day At Bedtime 4)  Anacin 81 Mg  Tbec (Aspirin) .... Take 1 Tablet By Mouth Once A Day 5)  Truetrack Test   Strp (Glucose Blood) .... To Test Blood Sugar 2-3x/week 6)  Bd Ultra-Fine 33 Lancets   Misc (Lancets) .... To Test Blood Sugar 2-3x/week 7)  Glipizide 5 Mg Tabs (Glipizide) .... Take 1 Tablet By Mouth Once A Day  Allergies: 1)  ! Amoxicillin 2)  ! Augmentin 3)  ! Ace Inhibitors  Review of Systems       Per HPI.  Physical Exam  General:  alert, well-developed, and cooperative to examination.    Neck:  supple, full ROM, no thyromegaly, no JVD, and no  carotid bruits.    Lungs:  normal respiratory effort, no accessory muscle use, normal breath sounds, no crackles, and no wheezes.  Heart:  tachycardic, regular rhythm, no murmur, no gallop, and no rub.    Abdomen:  soft, non-tender, normal bowel sounds, no distention, no guarding, no rebound tenderness, no hepatomegaly, and no splenomegaly.    Msk:  no joint swelling, no joint warmth, and no redness over joints.    Extremities:  No cyanosis, clubbing, edema  Neurologic:  alert & oriented X3, cranial nerves II-XII intact, strength normal in all extremities, sensation intact to light touch, and gait normal.       Impression & Recommendations:  Problem # 1:  TACHYCARDIA (ICD-785.0) this is idiopathic.  Routine labs and EKG were done and were within normal limits, the patient remains asymptomatic and his pulse rate today is 96.  We will  continue to monitor.  Problem # 2:  DIABETES MELLITUS, TYPE II (ICD-250.00) Well controlled with aic 6.7 on current treatment, No new changes made today, Will continue to monitor.  will bring back in 3 months for followup.  His updated medication list for this problem includes:    Metformin Hcl 1000 Mg Tabs (Metformin hcl) .Marland Kitchen... Take 1 tablet by mouth two times a day    Anacin 81 Mg Tbec (Aspirin) .Marland Kitchen... Take 1 tablet by mouth once a day    Glipizide 5 Mg Tabs (Glipizide) .Marland Kitchen... Take 1 tablet by mouth once a day  Orders: Capillary Blood Glucose/CBG (16109)  Problem # 3:  HYPERTENSION (ICD-401.9) Well controlled on current treatment, No new changes made today, Will continue to monitor.    His updated medication list for this problem includes:    Hydrochlorothiazide 12.5 Mg Caps (Hydrochlorothiazide) .Marland Kitchen... Take 1 tablet by mouth once a day  BP today: 118/80 Prior BP: 121/82 (05/09/2010)  Prior 10 Yr Risk Heart Disease: 9 % (05/03/2007)  Labs Reviewed: K+: 4.0 (05/09/2010) Creat: : 0.97 (05/09/2010)   Chol: 98 (12/06/2009)   HDL: 29 (12/06/2009)   LDL: 32 (12/06/2009)   TG: 184 (12/06/2009)  Complete Medication List: 1)  Metformin Hcl 1000 Mg Tabs (Metformin hcl) .... Take 1 tablet by mouth two times a day 2)  Hydrochlorothiazide 12.5 Mg Caps (Hydrochlorothiazide) .... Take 1 tablet by mouth once a day 3)  Zocor 40 Mg Tabs (Simvastatin) .... Take 1 tablet by mouth once a day at bedtime 4)  Anacin 81 Mg Tbec (Aspirin) .... Take 1 tablet by mouth once a day 5)  Truetrack Test Strp (Glucose blood) .... To test blood sugar 2-3x/week 6)  Bd Ultra-fine 33 Lancets Misc (Lancets) .... To test blood sugar 2-3x/week 7)  Glipizide 5 Mg Tabs (Glipizide) .... Take 1 tablet by mouth once a day  Patient Instructions: 1)  Please schedule a follow-up appointment in 3 months.   Prevention & Chronic Care Immunizations   Influenza vaccine: Fluvax 3+  (01/24/2010)    Tetanus booster: 01/24/2010:  Td   Td booster deferral: Deferred  (12/06/2009)    Pneumococcal vaccine: Not documented  Other Screening   Smoking status: never  (05/23/2010)  Diabetes Mellitus   HgbA1C: 6.7  (05/09/2010)   HgbA1C action/deferral: Ordered  (01/24/2010)   Hemoglobin A1C due: 09/03/2007    Eye exam: No diabetic retinopathy.     (04/24/2008)   Diabetic eye exam action/deferral: Not indicated  (12/06/2009)   Eye exam due: 04/2009    Foot exam: yes  (03/12/2008)   Foot exam action/deferral: Deferred  High risk foot: Not documented   Foot care education: Not documented    Urine microalbumin/creatinine ratio: 22.5  (12/06/2009)   Urine microalbumin action/deferral: Ordered  Lipids   Total Cholesterol: 98  (12/06/2009)   Lipid panel action/deferral: Lipid Panel ordered   LDL: 32  (12/06/2009)   LDL Direct: Not documented   HDL: 29  (12/06/2009)   Triglycerides: 184  (12/06/2009)   Lipid panel due: 12/26/2007    SGOT (AST): 19  (05/09/2010)   BMP action: Ordered   SGPT (ALT): 30  (05/09/2010)   Alkaline phosphatase: 56  (05/09/2010)   Total bilirubin: 0.9  (05/09/2010)  Hypertension   Last Blood Pressure: 118 / 80  (05/23/2010)   Serum creatinine: 0.97  (05/09/2010)   BMP action: Ordered   Serum potassium 4.0  (05/09/2010)  Self-Management Support :   Personal Goals (by the next clinic visit) :     Personal A1C goal: 7  (12/06/2009)     Personal blood pressure goal: 130/80  (12/06/2009)     Personal LDL goal: 70  (12/06/2009)    Patient will work on the following items until the next clinic visit to reach self-care goals:     Medications and monitoring: take my medicines every day, check my blood sugar, check my blood pressure, bring all of my medications to every visit, weigh myself weekly  (05/23/2010)     Eating: drink diet soda or water instead of juice or soda, eat more vegetables, use fresh or frozen vegetables, eat fruit for snacks and desserts, limit or avoid alcohol   (05/23/2010)     Activity: take a 30 minute walk every day  (05/23/2010)    Diabetes self-management support: Copy of home glucose meter record, Written self-care plan, Education handout, Resources for patients handout  (05/23/2010)   Diabetes care plan printed   Diabetes education handout printed   Last medical nutrition therapy: 05/20/2007    Hypertension self-management support: Written self-care plan, Education handout, Resources for patients handout  (05/23/2010)   Hypertension self-care plan printed.   Hypertension education handout printed    Lipid self-management support: Written self-care plan, Education handout, Resources for patients handout  (05/23/2010)   Lipid self-care plan printed.   Lipid education handout printed      Resource handout printed.

## 2010-11-22 NOTE — Assessment & Plan Note (Signed)
Summary: reaction to BP med, er f/u 3/27/pcp-Franceska Strahm/hla   Vital Signs:  Patient profile:   45 year old male Height:      62.5 inches (158.75 cm) Weight:      145.8 pounds (66.27 kg) BMI:     26.34 Temp:     97.4 degrees F (36.33 degrees C) oral Pulse rate:   110 / minute BP sitting:   131 / 87  (right arm)  Vitals Entered By: Cynda Familia Duncan Dull) (January 24, 2010 9:57 AM) Is Patient Diabetic? Yes  Have you ever been in a relationship where you felt threatened, hurt or afraid?Unable to ask  Domestic Violence Intervention caregivers at side  Does patient need assistance? Functional Status Cook/clean, Shopping Ambulation Normal   History of Present Illness: 45 yo M with Past Medical History:  Diabetes mellitus, type II (last aic 7.3) Hypertension Mental retardation Inguinal hernia,bilateral  Presents for f/u after ed visit for edema thought to be 2/2 to ace, now patient is off ace. and not any BP meds. Patient now had no swelling and is otherwise doing well, and denies any other complaints.     Swelling on R side. with rash,which developed within 30 min. In ED got prednisone, benadryl, IVF, swelling fully resolved by tuesday.   No dental problems. Denies CP, SOB, and is otherwise doing well, and denies any other complaints.    Current Medications (verified): 1)  Metformin Hcl 1000 Mg Tabs (Metformin Hcl) .... Take 1 Tablet By Mouth Two Times A Day 2)  Hydrochlorothiazide 12.5 Mg  Caps (Hydrochlorothiazide) .... Take 1 Tablet By Mouth Once A Day 3)  Zocor 40 Mg  Tabs (Simvastatin) .... Take 1 Tablet By Mouth Once A Day At Bedtime 4)  Anacin 81 Mg  Tbec (Aspirin) .... Take 1 Tablet By Mouth Once A Day 5)  Truetrack Test   Strp (Glucose Blood) .... To Test Blood Sugar 2-3x/week 6)  Bd Ultra-Fine 33 Lancets   Misc (Lancets) .... To Test Blood Sugar 2-3x/week 7)  Glipizide 5 Mg Tabs (Glipizide) .... Take 1 Tablet By Mouth Once A Day  Allergies: 1)  ! Amoxicillin 2)  !  Augmentin 3)  ! Ace Inhibitors  Review of Systems       per HPI.   Physical Exam  General:  alert, well-developed, and cooperative to examination.    Lungs:  normal respiratory effort, no accessory muscle use, normal breath sounds, no crackles, and no wheezes.  Heart:  normal rate, regular rhythm, no murmur, no gallop, and no rub.    Abdomen:  soft, non-tender, normal bowel sounds, no distention, no guarding, no rebound tenderness, no hepatomegaly, and no splenomegaly.    Msk:  no joint swelling, no joint warmth, and no redness over joints.    Pulses:  2+ DP/PT pulses bilaterally  Extremities:  No cyanosis, clubbing, edema  Neurologic:  alert & oriented X3, cranial nerves II-XII intact, strength normal in all extremities, sensation intact to light touch, and gait normal.       Impression & Recommendations:  Problem # 1:  ANGIOEDEMA (ICD-995.1) 1 episode last sunday, thought to be 2/2 to ace, it resolved with prednisone and benadryl,  Will hold ace for now, and not give ARB, as BP is well controlled, and Microalb/cr is 22.5 (10/2009)  Will recheck Microalb/cr ratio in 3 month and reassess need for ARB.   Problem # 2:  DIABETES MELLITUS, TYPE II (ICD-250.00) A1c slightly elevated, will check Increase glipizide to 5  from 2.5 and recheck a1c in 3 months.   The following medications were removed from the medication list:    Enalapril Maleate 10 Mg Tabs (Enalapril maleate) .Marland Kitchen... Take 1 tablet by mouth once a day His updated medication list for this problem includes:    Metformin Hcl 1000 Mg Tabs (Metformin hcl) .Marland Kitchen... Take 1 tablet by mouth two times a day    Anacin 81 Mg Tbec (Aspirin) .Marland Kitchen... Take 1 tablet by mouth once a day    Glipizide 5 Mg Tabs (Glipizide) .Marland Kitchen... Take 1 tablet by mouth once a day  Orders: T-Hgb A1C (in-house) (81191YN) T- Capillary Blood Glucose (82956)  Problem # 3:  DYSLIPIDEMIA (ICD-272.4) Well controlled, will recheck lft and FLP next visit.  His updated  medication list for this problem includes:    Zocor 40 Mg Tabs (Simvastatin) .Marland Kitchen... Take 1 tablet by mouth once a day at bedtime  Problem # 4:  HYPERTENSION (ICD-401.9) well controlled off ace, per problem 1. The following medications were removed from the medication list:    Enalapril Maleate 10 Mg Tabs (Enalapril maleate) .Marland Kitchen... Take 1 tablet by mouth once a day His updated medication list for this problem includes:    Hydrochlorothiazide 12.5 Mg Caps (Hydrochlorothiazide) .Marland Kitchen... Take 1 tablet by mouth once a day  Complete Medication List: 1)  Metformin Hcl 1000 Mg Tabs (Metformin hcl) .... Take 1 tablet by mouth two times a day 2)  Hydrochlorothiazide 12.5 Mg Caps (Hydrochlorothiazide) .... Take 1 tablet by mouth once a day 3)  Zocor 40 Mg Tabs (Simvastatin) .... Take 1 tablet by mouth once a day at bedtime 4)  Anacin 81 Mg Tbec (Aspirin) .... Take 1 tablet by mouth once a day 5)  Truetrack Test Strp (Glucose blood) .... To test blood sugar 2-3x/week 6)  Bd Ultra-fine 33 Lancets Misc (Lancets) .... To test blood sugar 2-3x/week 7)  Glipizide 5 Mg Tabs (Glipizide) .... Take 1 tablet by mouth once a day  Other Orders: Flu Vaccine 15yrs + (21308) Administration Flu vaccine - MCR (G0008) TD Toxoids IM 7 YR + (65784) Admin of Any Addtl Vaccine (69629)  Patient Instructions: 1)  Please schedule a follow-up appointment in 3 months For A1c check.  2)  I have increased your glipizide to 5mg  from 2.5 Prescriptions: GLIPIZIDE 5 MG TABS (GLIPIZIDE) Take 1 tablet by mouth once a day  #30 x 3   Entered and Authorized by:   Darnelle Maffucci MD   Signed by:   Darnelle Maffucci MD on 01/24/2010   Method used:   Electronically to        [facility]  [address] [phone]* (retail)       [address]       [place], [place]  [address]       Ph: [phone] or [phone]       Fax: [phone]   RxID:   [id number]    Prevention & Chronic Care Immunizations   Influenza vaccine: Fluvax 3+   (01/24/2010)    Tetanus booster: 01/24/2010: Td   Td booster deferral: Deferred  (12/06/2009)    Pneumococcal vaccine: Not documented  Other Screening   Smoking status: never  (12/06/2009)  Diabetes Mellitus   HgbA1C: 7.0  (01/24/2010)   HgbA1C action/deferral: Ordered  (01/24/2010)   Hemoglobin A1C due: 09/03/2007    Eye exam: No diabetic retinopathy.     (04/24/2008)   Diabetic eye exam action/deferral: Not indicated  (  12/06/2009)   Eye exam due: 04/2009    Foot exam: yes  (03/12/2008)   Foot exam action/deferral: Deferred   High risk foot: Not documented   Foot care education: Not documented    Urine microalbumin/creatinine ratio: 22.5  (12/06/2009)   Urine microalbumin action/deferral: Ordered    Diabetes flowsheet reviewed?: Yes   Progress toward A1C goal: Deteriorated  Lipids   Total Cholesterol: 98  (12/06/2009)   Lipid panel action/deferral: Lipid Panel ordered   LDL: 32  (12/06/2009)   LDL Direct: Not documented   HDL: 29  (12/06/2009)   Triglycerides: 184  (12/06/2009)   Lipid panel due: 12/26/2007    SGOT (AST): 43  (12/06/2009)   BMP action: Ordered   SGPT (ALT): 63  (12/06/2009)   Alkaline phosphatase: 48  (12/06/2009)   Total bilirubin: 0.7  (12/06/2009)    Lipid flowsheet reviewed?: Yes   Progress toward LDL goal: At goal  Hypertension   Last Blood Pressure: 131 / 87  (01/24/2010)   Serum creatinine: 0.91  (12/06/2009)   BMP action: Ordered   Serum potassium 4.6  (12/06/2009)    Hypertension flowsheet reviewed?: Yes   Progress toward BP goal: Unchanged  Self-Management Support :   Personal Goals (by the next clinic visit) :     Personal A1C goal: 7  (12/06/2009)     Personal blood pressure goal: 130/80  (12/06/2009)     Personal LDL goal: 70  (12/06/2009)    Patient will work on the following items until the next clinic visit to reach self-care goals:     Medications and monitoring: take my medicines every day  (01/24/2010)      Eating: drink diet soda or water instead of juice or soda, use fresh or frozen vegetables, eat foods that are low in salt, eat baked foods instead of fried foods  (01/24/2010)     Activity: take a 30 minute walk every day  (12/06/2009)    Diabetes self-management support: Pre-printed educational material, Written self-care plan  (01/24/2010)   Diabetes care plan printed   Last medical nutrition therapy: 05/20/2007    Hypertension self-management support: Pre-printed educational material, Written self-care plan  (01/24/2010)   Hypertension self-care plan printed.    Lipid self-management support: Pre-printed educational material, Written self-care plan  (01/24/2010)   Lipid self-care plan printed.   Nursing Instructions: Give tetanus booster today HgbA1C today (see order)    Laboratory Results   Blood Tests   Date/Time Received: January 24, 2010 11:10 AM  Date/Time Reported: Burke Keels  January 24, 2010 11:10 AM   HGBA1C: 7.0%   (Normal Range: Non-Diabetic - 3-6%   Control Diabetic - 6-8%) CBG Fasting:: 209mg /dL    Flu Vaccine Consent Questions     Do you have a history of severe allergic reactions to this vaccine? no    Any prior history of allergic reactions to egg and/or gelatin? no    Do you have a sensitivity to the preservative Thimersol? no    Do you have a past history of Guillan-Barre Syndrome? no    Do you currently have an acute febrile illness? no    Have you ever had a severe reaction to latex? no    Vaccine information given and explained to patient? yes    Are you currently pregnant? no    Lot [id number]   Exp Date:01/20/2010   Manufacturer: [employer]    Site Given  Right Deltoid IM.Cynda Familia [facility])  January 24, 2010 11:17 AM    HGBA1C: 7.0%   (Normal Range: Non-Diabetic - 3-6%   Control Diabetic - 6-8%) CBG Fasting:: 209mg /dL     .medflu   Immunizations Administered:  Tetanus Vaccine:    Vaccine Type: Td    Site: left deltoid     Mfr: Sanofi Pasteur    Dose: 0.5 ml    Route: IM    Given by: Cynda Familia ([facility])    Exp. Date: 09/07/2011    Lot #: [id number]    VIS given: 09/10/07 version given January 24, 2010.

## 2010-11-22 NOTE — Letter (Signed)
Summary: LOG BOOK REPORT 08-16-2010-08-29-2010  LOG BOOK REPORT 08-16-2010-08-29-2010   Imported By: Margie Billet 09/05/2010 11:10:46  _____________________________________________________________________  External Attachment:    Type:   Image     Comment:   External Document

## 2010-11-25 NOTE — Letter (Signed)
Summary: Blood Sugar Log  Blood Sugar Log   Imported By: Florinda Marker 12/07/2009 13:44:01  _____________________________________________________________________  External Attachment:    Type:   Image     Comment:   External Document

## 2010-12-14 ENCOUNTER — Other Ambulatory Visit: Payer: Self-pay | Admitting: Internal Medicine

## 2010-12-15 ENCOUNTER — Other Ambulatory Visit: Payer: Self-pay | Admitting: Internal Medicine

## 2010-12-18 ENCOUNTER — Other Ambulatory Visit: Payer: Self-pay | Admitting: Internal Medicine

## 2011-01-06 LAB — GLUCOSE, CAPILLARY: Glucose-Capillary: 163 mg/dL — ABNORMAL HIGH (ref 70–99)

## 2011-01-07 LAB — GLUCOSE, CAPILLARY: Glucose-Capillary: 148 mg/dL — ABNORMAL HIGH (ref 70–99)

## 2011-01-09 ENCOUNTER — Ambulatory Visit (INDEPENDENT_AMBULATORY_CARE_PROVIDER_SITE_OTHER): Payer: Medicare Other | Admitting: Internal Medicine

## 2011-01-09 ENCOUNTER — Encounter: Payer: Self-pay | Admitting: Internal Medicine

## 2011-01-09 DIAGNOSIS — E119 Type 2 diabetes mellitus without complications: Secondary | ICD-10-CM

## 2011-01-09 DIAGNOSIS — E785 Hyperlipidemia, unspecified: Secondary | ICD-10-CM

## 2011-01-09 DIAGNOSIS — I10 Essential (primary) hypertension: Secondary | ICD-10-CM

## 2011-01-09 DIAGNOSIS — T783XXA Angioneurotic edema, initial encounter: Secondary | ICD-10-CM

## 2011-01-09 LAB — GLUCOSE, CAPILLARY: Glucose-Capillary: 89 mg/dL (ref 70–99)

## 2011-01-09 MED ORDER — SIMVASTATIN 20 MG PO TABS: 20.0000 mg | ORAL_TABLET | Freq: Every day | ORAL | Status: DC

## 2011-01-09 NOTE — Patient Instructions (Addendum)
Angioedema Angioedema (AE) is sudden swelling of the skin. It can happen in the face, mouth, genitals, or other body parts. AE can be caused by allergies to things like medicines or food. Or it can happen by itself. It often starts while you are asleep. You might also get red, itchy patches of skin (hives) and other allergy reactions.  Angioedema happens fast, and gets better in 24 to 48 hours. It is usually not a serious problem. AE can be helped by taking medicines for it. You should also avoid the thing that caused the reaction. HOME CARE To take care of yourself:  Always carry your emergency allergy medicines with you.   Wear a medical bracelet.   Avoid things that you know will cause this reaction (triggers).  GET HELP IF:  You have another attack.   Attacks happen more often or get worse.  GET HELP RIGHT AWAY IF:  You have trouble breathing.   You have trouble swallowing.   You faint.  You should get help right away if your throat swells and you have trouble breathing. An attack can be life threatening. Call your local medical emergency services (911 in the [place] MAKE SURE YOU:   Understand these instructions.   Will watch your condition.   Will get help right away if you are not doing well or get worse.  Document Released: 09/27/2009  [facility] Patient Information 2011 [place], [place].

## 2011-01-09 NOTE — Progress Notes (Signed)
  Subjective:    Patient ID: Larry Wiley, male    DOB: [birth year], 45 y.o.   MRN: [id number]  HPI  45 year old male presents to the outpatient clinic for routine clinical followup,  He is currently feeling well denies any complaints,  He reports being fully compliant with his medications,  And presents glucometer which shows sugar readings from 90-140.  Note that the patient had an episode of angioedema in December 2011,  This was thought to be secondary to ACE inhibitor use.  The patient has been off ACE since that episode,  However he reports that he had had 2 episodes in the beginning of this month of mild facial swelling that resolved after taking one tablet of Benadryl.  Patient denied any respiratory compromise.  Note that the patient had C3 and C4 level check along with ESR and CRP which were all within normal limits.  The patient denied any triggers for the symptoms.  Patient denies any chest pain nausea vomiting fever chills.  Review of Systems  [all other systems reviewed and are negative       Objective:   Physical Exam  Constitutional: He is oriented to person, place, and time. He appears well-developed and well-nourished.  HENT:  Head: Normocephalic and atraumatic.  Eyes: EOM are normal. Pupils are equal, round, and reactive to light.  Neck: Normal range of motion.  Cardiovascular: Normal rate and regular rhythm.   Murmur heard. Pulmonary/Chest: Effort normal and breath sounds normal.  Abdominal: Soft.  Neurological: He is alert and oriented to person, place, and time.          Assessment & Plan:

## 2011-01-09 NOTE — Assessment & Plan Note (Signed)
Patient's last LDL was 32,  This may be an unsafe level, therefore today we will reduce his simvastatin dose to 20 mg daily  For target LDL between 40 and 70. We'll recheck his fasting lipids next point

## 2011-01-09 NOTE — Assessment & Plan Note (Signed)
Excellent control, continue current regiment.

## 2011-01-09 NOTE — Assessment & Plan Note (Signed)
Patient's diabetes is in excellent control, his A1c 6.4 today.  Will reassess in 3 months.  Given his normal sugar readings over the past  6 months,  I have advised the patient to stop checking his sugars as often as they are not needed.  Patient voices understanding.

## 2011-01-09 NOTE — Assessment & Plan Note (Signed)
She had 2 further episodes of minor facial swelling, no known triggers,  no compromise to his breathing was noted,  Was worked up in the past  Number the patient has a normal C3 and C4.  Continue to provide patient with Benadryl as needed for swelling,  If the episodes happen again, will consider referral to an allergy specialist.

## 2011-01-11 LAB — GLUCOSE, CAPILLARY: Glucose-Capillary: 209 mg/dL — ABNORMAL HIGH (ref 70–99)

## 2011-01-23 ENCOUNTER — Other Ambulatory Visit: Payer: Self-pay | Admitting: Internal Medicine

## 2011-01-23 DIAGNOSIS — E119 Type 2 diabetes mellitus without complications: Secondary | ICD-10-CM

## 2011-01-23 MED ORDER — METFORMIN HCL 500 MG PO TABS: 500.0000 mg | ORAL_TABLET | Freq: Two times a day (BID) | ORAL | Status: DC

## 2011-01-23 NOTE — Assessment & Plan Note (Signed)
Patient requests refill on his metformin, given his excellent control diabetes reduce his dose to 500 twice a day. and sent a prescription to the pharmacy

## 2011-01-24 ENCOUNTER — Encounter: Payer: Self-pay | Admitting: Internal Medicine

## 2011-01-24 NOTE — Telephone Encounter (Signed)
I have reviewed the patient's chart, he is indeed supposed to be on metformin her for reasons unknown to me during the abstraction process it did not carry over from echart to epic. I have added the medication and sent a prescription to the pharmacy.

## 2011-01-31 LAB — GLUCOSE, CAPILLARY: Glucose-Capillary: 105 mg/dL — ABNORMAL HIGH (ref 70–99)

## 2011-11-20 ENCOUNTER — Other Ambulatory Visit: Payer: Self-pay | Admitting: Internal Medicine

## 2011-12-13 DIAGNOSIS — E119 Type 2 diabetes mellitus without complications: Secondary | ICD-10-CM | POA: Diagnosis not present

## 2011-12-13 DIAGNOSIS — H40019 Open angle with borderline findings, low risk, unspecified eye: Secondary | ICD-10-CM | POA: Diagnosis not present

## 2011-12-25 ENCOUNTER — Encounter: Payer: Self-pay | Admitting: Internal Medicine

## 2011-12-25 ENCOUNTER — Other Ambulatory Visit: Payer: Self-pay | Admitting: Internal Medicine

## 2011-12-25 ENCOUNTER — Ambulatory Visit (INDEPENDENT_AMBULATORY_CARE_PROVIDER_SITE_OTHER): Payer: Medicare Other | Admitting: Internal Medicine

## 2011-12-25 VITALS — BP 144/93 | HR 90 | Temp 97.5°F | Ht 62.0 in | Wt 147.7 lb

## 2011-12-25 DIAGNOSIS — I10 Essential (primary) hypertension: Secondary | ICD-10-CM

## 2011-12-25 DIAGNOSIS — E119 Type 2 diabetes mellitus without complications: Secondary | ICD-10-CM | POA: Diagnosis not present

## 2011-12-25 DIAGNOSIS — Z79899 Other long term (current) drug therapy: Secondary | ICD-10-CM

## 2011-12-25 DIAGNOSIS — Z299 Encounter for prophylactic measures, unspecified: Secondary | ICD-10-CM

## 2011-12-25 DIAGNOSIS — Z23 Encounter for immunization: Secondary | ICD-10-CM

## 2011-12-25 DIAGNOSIS — E785 Hyperlipidemia, unspecified: Secondary | ICD-10-CM | POA: Diagnosis not present

## 2011-12-25 LAB — POCT GLYCOSYLATED HEMOGLOBIN (HGB A1C): Hemoglobin A1C: 8.6

## 2011-12-25 MED ORDER — SIMVASTATIN 20 MG PO TABS: 20.0000 mg | ORAL_TABLET | Freq: Every day | ORAL | Status: DC

## 2011-12-25 MED ORDER — CHLORTHALIDONE 25 MG PO TABS: 25.0000 mg | ORAL_TABLET | Freq: Every day | ORAL | Status: DC

## 2011-12-25 MED ORDER — METFORMIN HCL 1000 MG PO TABS: 1000.0000 mg | ORAL_TABLET | Freq: Two times a day (BID) | ORAL | Status: DC

## 2011-12-25 NOTE — Patient Instructions (Signed)

## 2011-12-25 NOTE — Assessment & Plan Note (Addendum)
Hba1c is 8.6 today as compared to 6.4 last year. i will increase his metformin to 1000 mg BID and have him come back in 10 days and may be increase glucotrol at that time. Patient was advised to bring his meter back at next office visit. Check urine microalbumin ratio today. Foot exam done today. Will go ahead and start on combination HCTZ and lisinopril since he is diabetic and hypertensive.

## 2011-12-25 NOTE — Progress Notes (Signed)
  Subjective:    Patient ID: Larry Wiley, male    DOB: 12-Sep-1966, 46 y.o.   MRN: [id number]  HPI  Patient is here today for regular check up.  He has brought his medications and his diary where he maintains a log of his blood sugars. All values are between 98-218 with more consistent values in 120's range. He is supposed to be on metoprolol and zocor but is not taking them.  Foot exam done today. No other complaints.  Due to pneumovax and flu shot.   Review of Systems  Constitutional: Negative for fever, activity change and appetite change.  HENT: Negative for sore throat.   Respiratory: Negative for cough and shortness of breath.   Cardiovascular: Negative for chest pain and leg swelling.  Gastrointestinal: Negative for nausea, abdominal pain, diarrhea, constipation and abdominal distention.  Genitourinary: Negative for frequency, hematuria and difficulty urinating.  Neurological: Negative for dizziness and headaches.  Psychiatric/Behavioral: Negative for suicidal ideas and behavioral problems.       Objective:   Physical Exam  Constitutional: He is oriented to person, place, and time. He appears well-developed and well-nourished.  HENT:  Head: Normocephalic and atraumatic.  Eyes: Conjunctivae and EOM are normal. Pupils are equal, round, and reactive to light. No scleral icterus.  Neck: Normal range of motion. Neck supple. No JVD present. No thyromegaly present.  Cardiovascular: Normal rate, regular rhythm, normal heart sounds and intact distal pulses.  Exam reveals no gallop and no friction rub.   No murmur heard. Pulmonary/Chest: Effort normal and breath sounds normal. No respiratory distress. He has no wheezes. He has no rales.  Abdominal: Soft. Bowel sounds are normal. He exhibits no distension and no mass. There is no tenderness. There is no rebound and no guarding.  Musculoskeletal: Normal range of motion. He exhibits no edema and no tenderness.  Lymphadenopathy:    He  has no cervical adenopathy.  Neurological: He is alert and oriented to person, place, and time.  Psychiatric: He has a normal mood and affect. His behavior is normal.          Assessment & Plan:

## 2011-12-25 NOTE — Assessment & Plan Note (Addendum)
Patient not taking on metoprolol. He is only on HCTZ 12.5 at this time. i will start him on chlorthalidone at this time which has a longer half life. Will check BMP. Call in 10 days for recheck BMP and  BP.

## 2011-12-25 NOTE — Assessment & Plan Note (Signed)
Lipid profile today. Start zocor.

## 2011-12-26 LAB — COMPLETE METABOLIC PANEL WITH GFR
ALT: 69 U/L — ABNORMAL HIGH (ref 0–53)
Albumin: 4.6 g/dL (ref 3.5–5.2)
Alkaline Phosphatase: 54 U/L (ref 39–117)
Calcium: 10.1 mg/dL (ref 8.4–10.5)
GFR, Est Non African American: >89 mL/min
Glucose, Bld: 143 mg/dL — ABNORMAL HIGH (ref 70–99)
Potassium: 4.1 mEq/L (ref 3.5–5.3)
Sodium: 139 mEq/L (ref 135–145)
Total Protein: 7.5 g/dL (ref 6.0–8.3)

## 2011-12-26 LAB — MICROALBUMIN / CREATININE URINE RATIO: Microalb Creat Ratio: 29.1 mg/g (ref 0.0–30.0)

## 2011-12-26 LAB — LIPID PANEL
Cholesterol: 157 mg/dL (ref 0–200)
Triglycerides: 192 mg/dL — ABNORMAL HIGH (ref <150)
VLDL: 38 mg/dL (ref 0–40)

## 2012-01-03 ENCOUNTER — Encounter: Payer: Self-pay | Admitting: Internal Medicine

## 2012-01-03 ENCOUNTER — Ambulatory Visit (INDEPENDENT_AMBULATORY_CARE_PROVIDER_SITE_OTHER): Payer: Medicare Other | Admitting: Internal Medicine

## 2012-01-03 VITALS — BP 142/98 | HR 90 | Temp 97.9°F | Ht 62.0 in | Wt 144.5 lb

## 2012-01-03 DIAGNOSIS — I10 Essential (primary) hypertension: Secondary | ICD-10-CM | POA: Diagnosis not present

## 2012-01-03 DIAGNOSIS — E119 Type 2 diabetes mellitus without complications: Secondary | ICD-10-CM

## 2012-01-03 MED ORDER — METOPROLOL SUCCINATE 12.5 MG HALF TABLET: 12.5000 mg | ORAL_TABLET | Freq: Every day | ORAL | Status: DC

## 2012-01-03 MED ORDER — GLIPIZIDE ER 5 MG PO TB24: 5.0000 mg | ORAL_TABLET | Freq: Every day | ORAL | Status: DC

## 2012-01-03 NOTE — Patient Instructions (Signed)
Please increase your glipizide to 5mg  daily Start taking metoprolol 12.5mg  twice daily

## 2012-01-03 NOTE — Assessment & Plan Note (Signed)
A1c was 8.6 last week, patient's metformin was increased to 1000 mg twice a day, and glipizide to 5 mg daily.  We'll recheck A1c in 3 months to further guide management.

## 2012-01-03 NOTE — Progress Notes (Signed)
Subjective:     Patient ID: Larry Wiley, male   DOB: 1966/05/22, 46 y.o.   MRN: [id number]  HPI  Patient is a pleasant 46 year old male presents to the outpatient clinic for one-week followup, for a blood pressure check, and review of CBG log. After his medications were changed at last visit. Patient's CBG log shows sugars average between 120-160, and his blood pressure is borderline. Patient denies any other complaints  Review of Systems  All other systems reviewed and are negative.       Objective:   Physical Exam  Constitutional: He is oriented to person, place, and time. He appears well-developed and well-nourished.  HENT:  Head: Normocephalic and atraumatic.  Eyes: Pupils are equal, round, and reactive to light.  Neck: Normal range of motion. No JVD present. No thyromegaly present.  Cardiovascular: Normal rate, regular rhythm and normal heart sounds.   Pulmonary/Chest: Effort normal and breath sounds normal. He has no wheezes. He has no rales.  Abdominal: Soft. Bowel sounds are normal. There is no tenderness. There is no rebound.  Musculoskeletal: Normal range of motion. He exhibits no edema.  Neurological: He is alert and oriented to person, place, and time.  Skin: Skin is warm and dry.    Patient Active Problem List  Diagnoses  . DIABETES MELLITUS, TYPE II  . DYSLIPIDEMIA  . MENTAL RETARDATION  . HYPERTENSION  . TACHYCARDIA  . ANGIOEDEMA     Current Outpatient Prescriptions on File Prior to Visit  Medication Sig Dispense Refill  . aspirin 81 MG EC tablet Take 81 mg by mouth daily.        . chlorthalidone (HYGROTON) 25 MG tablet Take 1 tablet (25 mg total) by mouth daily.  30 tablet  11  . metFORMIN (GLUCOPHAGE) 1000 MG tablet Take 1 tablet (1,000 mg total) by mouth 2 (two) times daily with a meal.  60 tablet  11  . simvastatin (ZOCOR) 20 MG tablet Take 1 tablet (20 mg total) by mouth at bedtime.  30 tablet  11    Allergies  Allergen Reactions  . Ace Inhibitors      REACTION: angiodema  . Amoxicillin   . WUJ:[id number]+QIONGEXBMW Acid+Aspartame

## 2012-01-03 NOTE — Assessment & Plan Note (Signed)
Patient still hypertensive on chlorthalidone alone, restarts metoprolol at 12.5 mg twice a day and recheck his blood pressure in 3 months.

## 2012-01-04 LAB — BASIC METABOLIC PANEL
BUN: 15 mg/dL (ref 6–23)
CO2: 29 mEq/L (ref 19–32)
Calcium: 11.3 mg/dL — ABNORMAL HIGH (ref 8.4–10.5)
Chloride: 96 mEq/L (ref 96–112)
Creat: 0.97 mg/dL (ref 0.50–1.35)
Glucose, Bld: 164 mg/dL — ABNORMAL HIGH (ref 70–99)
Potassium: 3.8 mEq/L (ref 3.5–5.3)

## 2012-03-13 DIAGNOSIS — H40019 Open angle with borderline findings, low risk, unspecified eye: Secondary | ICD-10-CM | POA: Diagnosis not present

## 2012-03-13 DIAGNOSIS — H5500 Unspecified nystagmus: Secondary | ICD-10-CM | POA: Diagnosis not present

## 2012-04-09 ENCOUNTER — Other Ambulatory Visit: Payer: Self-pay | Admitting: *Deleted

## 2012-04-09 DIAGNOSIS — E785 Hyperlipidemia, unspecified: Secondary | ICD-10-CM

## 2012-04-09 DIAGNOSIS — I10 Essential (primary) hypertension: Secondary | ICD-10-CM

## 2012-04-09 MED ORDER — CHLORTHALIDONE 25 MG PO TABS: 25.0000 mg | ORAL_TABLET | Freq: Every day | ORAL | Status: DC

## 2012-04-09 MED ORDER — SIMVASTATIN 20 MG PO TABS: 20.0000 mg | ORAL_TABLET | Freq: Every day | ORAL | Status: DC

## 2012-04-09 MED ORDER — METFORMIN HCL 1000 MG PO TABS: 1000.0000 mg | ORAL_TABLET | Freq: Two times a day (BID) | ORAL | Status: DC

## 2012-04-09 NOTE — Telephone Encounter (Signed)
[facility] request authorization to dispense 90 day supply  On these 3 refills.

## 2012-07-11 DIAGNOSIS — H40019 Open angle with borderline findings, low risk, unspecified eye: Secondary | ICD-10-CM | POA: Diagnosis not present

## 2012-09-27 ENCOUNTER — Encounter: Payer: Self-pay | Admitting: Internal Medicine

## 2012-09-27 ENCOUNTER — Ambulatory Visit (INDEPENDENT_AMBULATORY_CARE_PROVIDER_SITE_OTHER): Payer: Medicare Other | Admitting: Internal Medicine

## 2012-09-27 VITALS — BP 135/87 | Temp 97.7°F | Resp 79 | Wt 147.3 lb

## 2012-09-27 DIAGNOSIS — K219 Gastro-esophageal reflux disease without esophagitis: Secondary | ICD-10-CM | POA: Diagnosis not present

## 2012-09-27 DIAGNOSIS — E663 Overweight: Secondary | ICD-10-CM

## 2012-09-27 DIAGNOSIS — E785 Hyperlipidemia, unspecified: Secondary | ICD-10-CM | POA: Diagnosis not present

## 2012-09-27 DIAGNOSIS — E119 Type 2 diabetes mellitus without complications: Secondary | ICD-10-CM | POA: Diagnosis not present

## 2012-09-27 DIAGNOSIS — I10 Essential (primary) hypertension: Secondary | ICD-10-CM

## 2012-09-27 DIAGNOSIS — Z0000 Encounter for general adult medical examination without abnormal findings: Secondary | ICD-10-CM | POA: Insufficient documentation

## 2012-09-27 DIAGNOSIS — Z23 Encounter for immunization: Secondary | ICD-10-CM

## 2012-09-27 HISTORY — DX: Gastro-esophageal reflux disease without esophagitis: K21.9

## 2012-09-27 HISTORY — DX: Overweight: E66.3

## 2012-09-27 HISTORY — DX: Encounter for general adult medical examination without abnormal findings: Z00.00

## 2012-09-27 LAB — GLUCOSE, CAPILLARY: Glucose-Capillary: 154 mg/dL — ABNORMAL HIGH (ref 70–99)

## 2012-09-27 MED ORDER — HYDROCHLOROTHIAZIDE 12.5 MG PO TABS: 12.5000 mg | ORAL_TABLET | Freq: Every day | ORAL | Status: DC

## 2012-09-27 NOTE — Progress Notes (Signed)
  Subjective:    Patient ID: Larry Wiley, male    DOB: 1965-11-05, 46 y.o.   MRN: [id number]  HPI  Please see the A&P for the status of the pt's chronic medical problems.  Review of Systems  Constitutional: Negative for activity change, appetite change and unexpected weight change.  HENT: Negative for facial swelling, neck pain and neck stiffness.   Eyes: Negative for pain.  Respiratory: Negative for chest tightness and shortness of breath.   Cardiovascular: Negative for chest pain, palpitations and leg swelling.  Gastrointestinal: Negative for abdominal pain.  Musculoskeletal: Negative for myalgias, back pain and arthralgias.  Skin: Negative for wound.  Neurological: Negative for dizziness, seizures, syncope, weakness and light-headedness.  Psychiatric/Behavioral: Negative for agitation.      Objective:   Physical Exam  Nursing note and vitals reviewed. Constitutional: He is oriented to person, place, and time. He appears well-developed and well-nourished. No distress.  HENT:  Head: Normocephalic and atraumatic.  Mouth/Throat: Oropharynx is clear and moist. No oropharyngeal exudate.  Neck: Normal range of motion. Neck supple.  Cardiovascular: Normal rate, regular rhythm and normal heart sounds.  Exam reveals no gallop and no friction rub.   No murmur heard. Pulmonary/Chest: Effort normal and breath sounds normal. No respiratory distress. He has no wheezes. He has no rales.  Abdominal: Soft. Bowel sounds are normal. He exhibits no distension. There is no tenderness. There is no rebound and no guarding.  Musculoskeletal: He exhibits no edema and no tenderness.  Lymphadenopathy:    He has no cervical adenopathy.  Neurological: He is alert and oriented to person, place, and time.  Skin: Skin is warm and dry. No rash noted. He is not diaphoretic. No erythema.  Psychiatric: He has a normal mood and affect. His behavior is normal.      Assessment & Plan:   Please see problem  oriented charting.

## 2012-09-27 NOTE — Assessment & Plan Note (Signed)
Diabetic control is much improved. His hemoglobin A1c has dropped from 8.6 in March to 6.9 today. He and his sister note that he has decreased the amount of sodas that he is drinking and is now drinking a diet soda every other day. They have also increased the vegetables in his diet. He seems to be checking his sugars before breakfast, before dinner, and before bedtime. He denies any symptomatic hypoglycemic episodes. In addition to his dietary changes he has continued to take his glipizide XL 5 mg by mouth daily and metformin 1000 mg by mouth twice a day. He was recently seen by Dr. Dione Booze of ophthalmology and we will attempt to get those records. His blood pressure and LDL are within goal. He will be due for a diabetic foot examination, repeat lipid panel and a urine microalbumin at the followup visit in 6 months. In the meantime, we will continue with the glipizide and metformin at the current doses.

## 2012-09-27 NOTE — Assessment & Plan Note (Signed)
His blood pressure is at target today at 135/87. This is improved from the previous reading in March. He states that he has been compliant with his metoprolol XL 12.5 mg daily and hydrochlorothiazide 12.5 mg daily. He has tolerated these medications without side effects. Unfortunately, we are unable to use an ACE inhibitor or ARB secondary to a history of angioedema. We will continue the metoprolol XL and hydrochlorothiazide at the current doses.

## 2012-09-27 NOTE — Assessment & Plan Note (Signed)
He states he's been compliant with his simvastatin 20 mg by mouth at bedtime. He's had no myalgias associated with this therapy. His last LDL was in March and was 84. We will recheck his lipid panel at the followup visit in 6 months. In the meantime, we will continue the simvastatin at 20 mg by mouth at bedtime.

## 2012-09-27 NOTE — Patient Instructions (Signed)
It was nice to meet you.  I look forward to taking care of you for years to come.  1) Keep taking all of your medications as prescribed and keep following your diet.  Your diabetes is much improved.  2)  We gave you the flu shot.  3) We will see you back in 6 months.

## 2012-09-27 NOTE — Assessment & Plan Note (Signed)
He was due for his flu shot today. He was interested and agreed. He therefore was given his flu shot prior to leaving the clinic.

## 2012-09-27 NOTE — Assessment & Plan Note (Signed)
He notes an occasional bout of heartburn but does not feel it is severe enough to ward therapy at this time. He will let me know if he is interested in pharmacotherapy in the future.

## 2012-10-11 ENCOUNTER — Other Ambulatory Visit: Payer: Self-pay | Admitting: Internal Medicine

## 2012-10-11 DIAGNOSIS — I10 Essential (primary) hypertension: Secondary | ICD-10-CM

## 2012-12-13 ENCOUNTER — Other Ambulatory Visit: Payer: Self-pay | Admitting: Internal Medicine

## 2012-12-13 DIAGNOSIS — IMO0001 Reserved for inherently not codable concepts without codable children: Secondary | ICD-10-CM

## 2012-12-22 ENCOUNTER — Other Ambulatory Visit: Payer: Self-pay | Admitting: Internal Medicine

## 2012-12-22 DIAGNOSIS — E785 Hyperlipidemia, unspecified: Secondary | ICD-10-CM

## 2012-12-26 ENCOUNTER — Other Ambulatory Visit: Payer: Self-pay | Admitting: Internal Medicine

## 2012-12-26 DIAGNOSIS — I10 Essential (primary) hypertension: Secondary | ICD-10-CM

## 2013-02-17 ENCOUNTER — Other Ambulatory Visit: Payer: Self-pay | Admitting: Internal Medicine

## 2013-02-17 DIAGNOSIS — IMO0001 Reserved for inherently not codable concepts without codable children: Secondary | ICD-10-CM

## 2013-03-13 DIAGNOSIS — J209 Acute bronchitis, unspecified: Secondary | ICD-10-CM | POA: Diagnosis not present

## 2013-05-01 ENCOUNTER — Other Ambulatory Visit: Payer: Self-pay

## 2013-07-16 DIAGNOSIS — H40019 Open angle with borderline findings, low risk, unspecified eye: Secondary | ICD-10-CM | POA: Diagnosis not present

## 2013-07-16 DIAGNOSIS — E119 Type 2 diabetes mellitus without complications: Secondary | ICD-10-CM | POA: Diagnosis not present

## 2013-07-25 ENCOUNTER — Ambulatory Visit (INDEPENDENT_AMBULATORY_CARE_PROVIDER_SITE_OTHER): Payer: Medicare Other | Admitting: Internal Medicine

## 2013-07-25 ENCOUNTER — Encounter: Payer: Self-pay | Admitting: Internal Medicine

## 2013-07-25 VITALS — BP 125/85 | HR 100 | Temp 97.1°F | Wt 143.2 lb

## 2013-07-25 DIAGNOSIS — E1129 Type 2 diabetes mellitus with other diabetic kidney complication: Secondary | ICD-10-CM | POA: Diagnosis not present

## 2013-07-25 DIAGNOSIS — H409 Unspecified glaucoma: Secondary | ICD-10-CM | POA: Diagnosis not present

## 2013-07-25 DIAGNOSIS — K219 Gastro-esophageal reflux disease without esophagitis: Secondary | ICD-10-CM

## 2013-07-25 DIAGNOSIS — R809 Proteinuria, unspecified: Secondary | ICD-10-CM

## 2013-07-25 DIAGNOSIS — E785 Hyperlipidemia, unspecified: Secondary | ICD-10-CM | POA: Diagnosis not present

## 2013-07-25 DIAGNOSIS — H4010X1 Unspecified open-angle glaucoma, mild stage: Secondary | ICD-10-CM

## 2013-07-25 DIAGNOSIS — I10 Essential (primary) hypertension: Secondary | ICD-10-CM | POA: Diagnosis not present

## 2013-07-25 DIAGNOSIS — H4010X0 Unspecified open-angle glaucoma, stage unspecified: Secondary | ICD-10-CM

## 2013-07-25 DIAGNOSIS — Z23 Encounter for immunization: Secondary | ICD-10-CM | POA: Diagnosis not present

## 2013-07-25 DIAGNOSIS — IMO0001 Reserved for inherently not codable concepts without codable children: Secondary | ICD-10-CM

## 2013-07-25 HISTORY — DX: Unspecified open-angle glaucoma, stage unspecified: H40.10X0

## 2013-07-25 LAB — LIPID PANEL
Cholesterol: 101 mg/dL (ref 0–200)
HDL: 35 mg/dL — ABNORMAL LOW (ref >39)
LDL Cholesterol: 52 mg/dL (ref 0–99)
Total CHOL/HDL Ratio: 2.9 Ratio
Triglycerides: 71 mg/dL (ref <150)
VLDL: 14 mg/dL (ref 0–40)

## 2013-07-25 LAB — POCT GLYCOSYLATED HEMOGLOBIN (HGB A1C): Hemoglobin A1C: 6.2

## 2013-07-25 MED ORDER — LATANOPROST 0.005 % OP SOLN: 1.0000 [drp] | Freq: Every day | OPHTHALMIC | Status: DC

## 2013-07-25 MED ORDER — GLIPIZIDE ER 2.5 MG PO TB24: 2.5000 mg | ORAL_TABLET | Freq: Every day | ORAL | Status: DC

## 2013-07-25 NOTE — Progress Notes (Signed)
  Subjective:    Patient ID: Larry Wiley, male    DOB: 1966/04/08, 47 y.o.   MRN: [id number]  HPI  Please see the A&P for the status of the pt's chronic medical problems.  Review of Systems  Constitutional: Negative for activity change and appetite change.  Eyes: Negative for pain.  Respiratory: Negative for shortness of breath.   Cardiovascular: Negative for chest pain and leg swelling.  Gastrointestinal: Negative for nausea, vomiting, abdominal pain and diarrhea.  Musculoskeletal: Negative for back pain and arthralgias.  Skin: Negative for rash.  Neurological: Negative for headaches.  Psychiatric/Behavioral: Negative for agitation.      Objective:   Physical Exam  Nursing note and vitals reviewed. Constitutional: He is oriented to person, place, and time. He appears well-developed and well-nourished. No distress.  HENT:  Head: Normocephalic and atraumatic.  Cardiovascular: Normal rate, regular rhythm and normal heart sounds.  Exam reveals no gallop and no friction rub.   No murmur heard. Pulmonary/Chest: Effort normal and breath sounds normal. No respiratory distress. He has no wheezes. He has no rales.  Abdominal: Soft. Bowel sounds are normal. He exhibits no distension. There is no tenderness. There is no rebound and no guarding.  Musculoskeletal: Normal range of motion. He exhibits no edema and no tenderness.  Neurological: He is alert and oriented to person, place, and time. He exhibits normal muscle tone.  Skin: Skin is warm and dry. No rash noted. He is not diaphoretic. No erythema.      Assessment & Plan:   Please see problem oriented charting.

## 2013-07-25 NOTE — Assessment & Plan Note (Signed)
Although he has intermittent reflux symptoms he says they are not severe enough to warrant therapy at this time.

## 2013-07-25 NOTE — Patient Instructions (Signed)
It was wonderful to see you again.  You and your sisters are doing a wonderful job caring for your health!  1) Keep taking all of your medications as prescribed (note the change in the glipizide dose).  2) Stop poking yourself to check your blood sugar.  3) Decrease the Glipizide ER to 2.5 mg by mouth daily.  I will see you back in 6 months for a check up of your diabetes, sooner if necessary.

## 2013-07-25 NOTE — Assessment & Plan Note (Signed)
Mr. Spickler was given the flu vaccination today.

## 2013-07-25 NOTE — Assessment & Plan Note (Signed)
He is tolerating the simvastatin well without any myalgias. A lipid panel is pending as of this dictation. We will make any adjustments as required once the results are back.

## 2013-07-25 NOTE — Assessment & Plan Note (Signed)
He is followed closely by Dr. Dione Booze. He had a recent exam on September 24 with decent pressures on Lantanoprost 0.005% one drop in both eyes daily. We will continue this therapy per Dr. Dione Booze.

## 2013-07-25 NOTE — Assessment & Plan Note (Signed)
He is tolerating his glipizide 5 mg daily and metformin 1000 mg twice daily well without any symptomatic hypoglycemic episodes. He and his sisters have been very good at watching his diet. The result is a further reduction in his hemoglobin A1c from 6.9 to 6.2 today. On September 24 he had his annual diabetic eye exam which demonstrated no diabetic retinopathy. We performed a diabetic foot exam today. A lipid panel and microalbumin are pending today. With his excellent control over the last several months we decided to stop routine home blood glucose monitoring. We also will decrease his glipizide to 2.5 mg by mouth daily. We will reassess his diabetic control with this change at the followup visit with a repeat hemoglobin A1c.

## 2013-07-25 NOTE — Assessment & Plan Note (Signed)
He is tolerating the metoprolol XL 25 mg daily and hydrochlorothiazide 12.5 mg daily without difficulty. His blood pressure today is at target at 125/85. We will continue both the metoprolol and the hydrochlorothiazide. Unfortunately we cannot treat him with an ACE inhibitor because of angioedema.

## 2013-07-26 LAB — MICROALBUMIN / CREATININE URINE RATIO: Microalb Creat Ratio: 29.4 mg/g (ref 0.0–30.0)

## 2013-07-28 NOTE — Progress Notes (Signed)
Total cholesterol 101 Triglycerides 71 HDL 35 LDL 52  Microalbumin/Creatinine 29.4  Microalbumin/Cr Ratio is high normal.  Will continue aggressive blood pressure and diabetic management since we can not use ACEI/ARBs secondary to angioedema with ACEI.

## 2013-11-04 ENCOUNTER — Other Ambulatory Visit: Payer: Self-pay | Admitting: Internal Medicine

## 2013-11-04 DIAGNOSIS — I10 Essential (primary) hypertension: Secondary | ICD-10-CM

## 2014-01-12 ENCOUNTER — Other Ambulatory Visit: Payer: Self-pay | Admitting: Internal Medicine

## 2014-01-12 DIAGNOSIS — E785 Hyperlipidemia, unspecified: Secondary | ICD-10-CM

## 2014-01-12 DIAGNOSIS — I10 Essential (primary) hypertension: Secondary | ICD-10-CM

## 2014-01-30 ENCOUNTER — Encounter: Payer: Self-pay | Admitting: Internal Medicine

## 2014-01-30 ENCOUNTER — Ambulatory Visit (INDEPENDENT_AMBULATORY_CARE_PROVIDER_SITE_OTHER): Payer: Medicare Other | Admitting: Internal Medicine

## 2014-01-30 VITALS — BP 124/86 | HR 79 | Temp 97.4°F | Wt 144.2 lb

## 2014-01-30 DIAGNOSIS — R809 Proteinuria, unspecified: Secondary | ICD-10-CM | POA: Diagnosis not present

## 2014-01-30 DIAGNOSIS — I10 Essential (primary) hypertension: Secondary | ICD-10-CM

## 2014-01-30 DIAGNOSIS — IMO0001 Reserved for inherently not codable concepts without codable children: Secondary | ICD-10-CM

## 2014-01-30 DIAGNOSIS — E785 Hyperlipidemia, unspecified: Secondary | ICD-10-CM | POA: Diagnosis not present

## 2014-01-30 DIAGNOSIS — E119 Type 2 diabetes mellitus without complications: Secondary | ICD-10-CM

## 2014-01-30 LAB — POCT GLYCOSYLATED HEMOGLOBIN (HGB A1C): HEMOGLOBIN A1C: 7.3

## 2014-01-30 LAB — GLUCOSE, CAPILLARY: Glucose-Capillary: 177 mg/dL — ABNORMAL HIGH (ref 70–99)

## 2014-01-30 NOTE — Assessment & Plan Note (Signed)
His blood pressure today was excellent at 124/86. This is within target. He is tolerating the hydrochlorothiazide 12.5 mg every morning and metoprolol XL 25 mg by mouth daily without difficulty. We will therefore continue this regimen at the current doses.

## 2014-01-30 NOTE — Assessment & Plan Note (Signed)
At the last visit his glipizide was decreased from 5 mg daily to 2.5 mg daily given his hemoglobin A1c of 6.2 at that time. He made that change without difficulty. His hemoglobin A1c today was 7.3. His sister notes that he has been snacking more than usual, and this includes a sandwich before bedtime. She also notes that he eats very quickly and they have been trying hard to slow him down. His activity has remained stable walking around the block with his sisters. The likely cause of the increase in hemoglobin A1c was both a decrease in the glipizide dose and the increase in the late night snacking. The patient, his sister, and I discussed trying to decrease the amount of snacking he did each night, compromising at one half a sandwich each night. They will also continue to work on slowing him down as he eats as this may decrease his volume of food. He will maintain his usual activity. We will continue the glipizide XL 2.5 mg daily and metformin 1000 mg by mouth twice daily. We will check a hemoglobin A1c at the followup visit. He is otherwise up-to-date on his diabetes associated health maintenance.

## 2014-01-30 NOTE — Patient Instructions (Signed)
It was great to see you again.  You are doing so well with your health.  Keep up the good work.  1) Please keep taking all of the medications as you have been.  We do not need to make any changes.  2) I will see you back in 6 months, sooner if necessary.

## 2014-01-30 NOTE — Progress Notes (Signed)
   Subjective:    Patient ID: Larry Wiley, male    DOB: 02/14/1966, 48 y.o.   MRN: [id number]  HPI  Please see the A&P for the status of the pt's chronic medical problems.  Review of Systems  Constitutional: Negative for activity change, appetite change and unexpected weight change.  Gastrointestinal: Negative for nausea, vomiting, diarrhea and constipation.  Neurological: Negative for weakness.      Objective:   Physical Exam  Nursing note and vitals reviewed. Constitutional: He is oriented to person, place, and time. He appears well-developed and well-nourished. No distress.  HENT:  Head: Normocephalic and atraumatic.  Eyes: Conjunctivae are normal. Right eye exhibits no discharge. Left eye exhibits no discharge. No scleral icterus.  Cardiovascular: Normal rate, regular rhythm and normal heart sounds.  Exam reveals no gallop and no friction rub.   No murmur heard. Pulmonary/Chest: Effort normal and breath sounds normal. No respiratory distress. He has no wheezes. He has no rales.  Abdominal: Soft. Bowel sounds are normal. He exhibits no distension. There is no tenderness. There is no rebound and no guarding.  Musculoskeletal: Normal range of motion. He exhibits no edema and no tenderness.  Neurological: He is alert and oriented to person, place, and time. He exhibits normal muscle tone.  Skin: Skin is warm and dry. No rash noted. He is not diaphoretic. No erythema.  Psychiatric: He has a normal mood and affect.      Assessment & Plan:   Please see problem oriented charting.

## 2014-01-30 NOTE — Assessment & Plan Note (Signed)
At the last clinic visit his LDL on simvastatin 20 mg by mouth daily was found to be 52. This is within target. He continues to tolerate his simvastatin well without myalgias. We will therefore continue at this dose.

## 2014-02-26 ENCOUNTER — Other Ambulatory Visit: Payer: Self-pay | Admitting: Internal Medicine

## 2014-02-26 DIAGNOSIS — IMO0001 Reserved for inherently not codable concepts without codable children: Secondary | ICD-10-CM

## 2014-07-29 ENCOUNTER — Other Ambulatory Visit: Payer: Self-pay | Admitting: *Deleted

## 2014-07-29 DIAGNOSIS — IMO0001 Reserved for inherently not codable concepts without codable children: Secondary | ICD-10-CM

## 2014-07-29 MED ORDER — GLIPIZIDE ER 2.5 MG PO TB24: 2.5000 mg | ORAL_TABLET | Freq: Every day | ORAL | Status: DC

## 2014-09-01 DIAGNOSIS — E119 Type 2 diabetes mellitus without complications: Secondary | ICD-10-CM | POA: Diagnosis not present

## 2014-09-01 DIAGNOSIS — H5500 Unspecified nystagmus: Secondary | ICD-10-CM | POA: Diagnosis not present

## 2014-09-01 DIAGNOSIS — H40013 Open angle with borderline findings, low risk, bilateral: Secondary | ICD-10-CM | POA: Diagnosis not present

## 2014-09-01 DIAGNOSIS — H2513 Age-related nuclear cataract, bilateral: Secondary | ICD-10-CM | POA: Diagnosis not present

## 2014-09-01 LAB — HM DIABETES EYE EXAM

## 2014-09-04 ENCOUNTER — Encounter: Payer: Self-pay | Admitting: Internal Medicine

## 2014-09-04 ENCOUNTER — Ambulatory Visit (INDEPENDENT_AMBULATORY_CARE_PROVIDER_SITE_OTHER): Payer: Medicare Other | Admitting: Internal Medicine

## 2014-09-04 VITALS — BP 125/79 | HR 80 | Temp 98.2°F | Wt 146.0 lb

## 2014-09-04 DIAGNOSIS — R809 Proteinuria, unspecified: Secondary | ICD-10-CM

## 2014-09-04 DIAGNOSIS — Z23 Encounter for immunization: Secondary | ICD-10-CM

## 2014-09-04 DIAGNOSIS — I10 Essential (primary) hypertension: Secondary | ICD-10-CM

## 2014-09-04 DIAGNOSIS — IMO0001 Reserved for inherently not codable concepts without codable children: Secondary | ICD-10-CM

## 2014-09-04 DIAGNOSIS — E663 Overweight: Secondary | ICD-10-CM | POA: Diagnosis not present

## 2014-09-04 DIAGNOSIS — Z0000 Encounter for general adult medical examination without abnormal findings: Secondary | ICD-10-CM

## 2014-09-04 DIAGNOSIS — Z6825 Body mass index (BMI) 25.0-25.9, adult: Secondary | ICD-10-CM | POA: Diagnosis not present

## 2014-09-04 DIAGNOSIS — E1369 Other specified diabetes mellitus with other specified complication: Secondary | ICD-10-CM | POA: Diagnosis not present

## 2014-09-04 DIAGNOSIS — H4010X1 Unspecified open-angle glaucoma, mild stage: Secondary | ICD-10-CM | POA: Diagnosis not present

## 2014-09-04 DIAGNOSIS — E119 Type 2 diabetes mellitus without complications: Secondary | ICD-10-CM | POA: Diagnosis not present

## 2014-09-04 DIAGNOSIS — E785 Hyperlipidemia, unspecified: Secondary | ICD-10-CM

## 2014-09-04 LAB — GLUCOSE, CAPILLARY: GLUCOSE-CAPILLARY: 177 mg/dL — AB (ref 70–99)

## 2014-09-04 LAB — POCT GLYCOSYLATED HEMOGLOBIN (HGB A1C): Hemoglobin A1C: 7.4

## 2014-09-04 MED ORDER — GLIPIZIDE ER 5 MG PO TB24: 5.0000 mg | ORAL_TABLET | Freq: Every day | ORAL | Status: DC

## 2014-09-04 MED ORDER — LATANOPROST 0.005 % OP SOLN: 1.0000 [drp] | Freq: Every day | OPHTHALMIC | Status: DC

## 2014-09-04 NOTE — Assessment & Plan Note (Signed)
His diabetic control is slightly worse than the last visit. Today his hemoglobin A1c was 7.4 up from 7.2. He has cut out some of his late night snacks. He also continues to walk around the block with his sisters. He continues to eat fast despite his sisters trying to get him to slow down during meals. I encouraged him to try to slow down in order to decrease the amount of food that he would eat. I assured him his sisters were trying to do the right thing by him. He is tolerating the metformin 1000 mg twice daily and glipizide 2.5 mg daily well. He has had no episodes of hypoglycemia.  He brought in his blood sugar log and his average blood glucose was 152. We will continue the metformin at 1000 mg twice daily and increase the glipizide XL to 5 mg daily. We encouraged him to slow down when he ate and to continue to walk around the block with his sisters. He was seen by Dr. Alden HippGrote on November 10 and the progress note was brought to this visit for our records. It demonstrated mild elevation in his intraocular pressures and no evidence of diabetic retinopathy. A return appointment was scheduled for 09/03/2015. A foot exam was also performed today and was unremarkable. A urine microalbumin was obtained and is pending at the time of this dictation. He is on moderate intensity statin therapy and his last LDL was 52. We will continue with this therapy and not recheck the level per guidelines. He is otherwise up-to-date on his diabetic health care maintenance.

## 2014-09-04 NOTE — Assessment & Plan Note (Signed)
He received a flu vaccination today. At the follow-up visit we will give him the PCV-13 vaccination. At the return visit we will also discuss the options for colon cancer screening. He is otherwise up-to-date on his health care maintenance.

## 2014-09-04 NOTE — Patient Instructions (Signed)
It was great to see you again as always.  1) I increased your glipizide to 5 mg daily for your diabetes.  2) Keep taking all of the other medications as you are.  3) We gave you the flu shot today.  I would like to see you back in 3 months to recheck your diabetes control.  We can see you sooner if necessary.

## 2014-09-04 NOTE — Assessment & Plan Note (Signed)
His blood pressure today was 125/79 which is within target. This is on hydrochlorothiazide 12.5 mg daily and metoprolol XL12.5 mg daily. We will continue with these antihypertensive regimen at the current doses and follow-up on blood pressure control at the return visit.

## 2014-09-04 NOTE — Assessment & Plan Note (Signed)
He is tolerating the simvastatin 20 mg by mouth nightly well without myalgias. Given his cardiovascular risk he will be maintained on this moderate intensity therapy. There is no need to check levels per guidelines.

## 2014-09-04 NOTE — Assessment & Plan Note (Signed)
He was seen by Dr. Alden HippGrote 3 days ago and was noted to have very mild elevations in his intraocular pressure while on latanoprost eyedrops. These will be continued and he will follow-up with Dr. Alden HippGrote on 09/03/2015 as already scheduled.

## 2014-09-04 NOTE — Assessment & Plan Note (Signed)
His weight is up almost 3 pounds since the last visit. We had a long discussion about trying to slow down while he ate as this might actually decrease the amount of food he ate in a session because it would give his brain time to receive the full signal from his stomach. His sisters will continue to work on having him slow down while he eats. They will also continue to take him around the block for walks. We will repeat his weight at the follow-up visit and see if we've made any progress.

## 2014-09-04 NOTE — Progress Notes (Signed)
   Subjective:    Patient ID: Larry Wiley, male    DOB: January 05, 1966, 48 y.o.   MRN: [id number]  HPI  Please see the A&P for the status of the pt's chronic medical problems.  Review of Systems  Constitutional: Negative for activity change, appetite change and unexpected weight change.  Eyes: Negative for visual disturbance.  Respiratory: Negative for shortness of breath.   Cardiovascular: Negative for chest pain and leg swelling.  Gastrointestinal: Negative for nausea, vomiting, abdominal pain, diarrhea and constipation.  Musculoskeletal: Negative for myalgias, back pain, joint swelling, arthralgias and neck pain.  Skin: Negative for color change, pallor, rash and wound.  Neurological: Negative for dizziness, syncope and weakness.  Psychiatric/Behavioral: Negative for agitation.      Objective:   Physical Exam  Constitutional: He appears well-developed and well-nourished. No distress.  HENT:  Head: Normocephalic and atraumatic.  Eyes: Conjunctivae are normal. Right eye exhibits no discharge. Left eye exhibits no discharge. No scleral icterus.  Cardiovascular: Normal rate, regular rhythm and normal heart sounds.  Exam reveals no gallop and no friction rub.   No murmur heard. Pulmonary/Chest: Effort normal and breath sounds normal. No respiratory distress. He has no wheezes. He has no rales.  Abdominal: Soft. Bowel sounds are normal. He exhibits no distension. There is no tenderness. There is no rebound and no guarding.  Musculoskeletal: Normal range of motion. He exhibits no edema or tenderness.  Neurological: He is alert. He exhibits normal muscle tone.  Skin: Skin is warm and dry. No rash noted. He is not diaphoretic. No erythema. No pallor.  Psychiatric: He has a normal mood and affect. His behavior is normal.  Nursing note and vitals reviewed.     Assessment & Plan:   Please see problem oriented charting.

## 2014-09-05 LAB — MICROALBUMIN / CREATININE URINE RATIO
CREATININE, URINE: 105.9 mg/dL
MICROALB UR: 1.5 mg/dL (ref <2.0)
Microalb Creat Ratio: 14.2 mg/g (ref 0.0–30.0)

## 2014-09-07 ENCOUNTER — Encounter: Payer: Self-pay | Admitting: *Deleted

## 2014-09-07 NOTE — Progress Notes (Signed)
Urine microalbumin 1.5  Microalbumin/Creatinine ratio 14.2  Urine microalbumin improved on the ACEI.  Will continue current therapy.

## 2014-12-25 ENCOUNTER — Encounter: Payer: Self-pay | Admitting: Internal Medicine

## 2014-12-25 ENCOUNTER — Ambulatory Visit (INDEPENDENT_AMBULATORY_CARE_PROVIDER_SITE_OTHER): Payer: Medicare Other | Admitting: Internal Medicine

## 2014-12-25 VITALS — BP 135/89 | HR 96 | Wt 147.0 lb

## 2014-12-25 DIAGNOSIS — E119 Type 2 diabetes mellitus without complications: Secondary | ICD-10-CM | POA: Diagnosis not present

## 2014-12-25 DIAGNOSIS — Z23 Encounter for immunization: Secondary | ICD-10-CM

## 2014-12-25 DIAGNOSIS — R809 Proteinuria, unspecified: Secondary | ICD-10-CM

## 2014-12-25 DIAGNOSIS — E785 Hyperlipidemia, unspecified: Secondary | ICD-10-CM | POA: Diagnosis not present

## 2014-12-25 DIAGNOSIS — I10 Essential (primary) hypertension: Secondary | ICD-10-CM | POA: Diagnosis not present

## 2014-12-25 DIAGNOSIS — IMO0001 Reserved for inherently not codable concepts without codable children: Secondary | ICD-10-CM

## 2014-12-25 LAB — GLUCOSE, CAPILLARY: GLUCOSE-CAPILLARY: 180 mg/dL — AB (ref 70–99)

## 2014-12-25 LAB — POCT GLYCOSYLATED HEMOGLOBIN (HGB A1C): HEMOGLOBIN A1C: 7.3

## 2014-12-25 NOTE — Assessment & Plan Note (Signed)
His diabetes is reasonably well controlled but not yet at target on the metformin 1000 mg twice daily and glipizide XL 5 mg by mouth daily. A hemoglobin A1c today was 7.3 down from 7.4 three months ago. I spoke with his sister who is his main caregiver and we agreed that eating less starchy vegetables such as pinto beans and potatoes and more green beans, which he likes, may help somewhat in controlling his sugars. He will also be more active as the weather improves and we may see a decrease in his weight and improvement in his diabetes. For the time being we will continue the metformin at 1000 mg by mouth twice daily and glipizide XL 5 mg daily. If we do not reach target at the next visit we could consider increasing the glipizide XL to 10 mg daily. He is otherwise up-to-date on his diabetic health care maintenance.

## 2014-12-25 NOTE — Patient Instructions (Signed)
It was a joy to see you today.  You guys are doing a nice job!  1) Keep taking all of the medications like you are.  2) Try to stay active.  It helps keep the weight under control.  3) Put more green beans on the plate and less potatoes or pinto beans.  This is better for your sugar and your weight.  4) We gave you a pneumonia shot today.  I will see you back in 3 months, sooner if necessary.

## 2014-12-25 NOTE — Assessment & Plan Note (Signed)
He received the Pneumovax 13 today. We also discussed the recommendations for HIV screening and his sister felt this would not be an appropriate time to get tested.

## 2014-12-25 NOTE — Assessment & Plan Note (Signed)
He is tolerating his moderate intensity simvastatin 20 mg by mouth daily without myalgias or other difficulties. We will therefore continue at the current intensity.

## 2014-12-25 NOTE — Assessment & Plan Note (Signed)
His blood pressure is at target today at 135/89. This is on Toprol-XL 12.5 mg by mouth daily and hydrochlorothiazide 12.5 mg by mouth daily. We will continue this antihypertensive regimen at these doses and recheck his blood pressure at the follow-up visit.

## 2014-12-25 NOTE — Progress Notes (Signed)
   Subjective:    Patient ID: Larry Wiley, male    DOB: 1966/09/11, 49 y.o.   MRN: [id number]  HPI  Please see the A&P for the status of the pt's chronic medical problems.  Review of Systems  Constitutional: Negative for activity change, appetite change, fatigue and unexpected weight change.  Respiratory: Negative for shortness of breath.   Cardiovascular: Negative for chest pain and leg swelling.  Gastrointestinal: Negative for nausea, vomiting, abdominal pain, diarrhea and constipation.  Skin: Negative for rash.  Psychiatric/Behavioral: Negative for behavioral problems, dysphoric mood and agitation.      Objective:   Physical Exam  Constitutional: He appears well-developed and well-nourished. No distress.  HENT:  Head: Normocephalic and atraumatic.  Eyes: Conjunctivae are normal. Right eye exhibits no discharge. Left eye exhibits no discharge. No scleral icterus.  Cardiovascular: Normal rate, regular rhythm and normal heart sounds.  Exam reveals no gallop and no friction rub.   No murmur heard. Pulmonary/Chest: Effort normal and breath sounds normal. No respiratory distress. He has no wheezes. He has no rales.  Abdominal: Soft. Bowel sounds are normal. He exhibits no distension. There is no tenderness. There is no rebound and no guarding.  Musculoskeletal: Normal range of motion. He exhibits no edema or tenderness.  Neurological: He is alert. He exhibits normal muscle tone.  Skin: Skin is warm and dry. No rash noted. He is not diaphoretic. No erythema.  Psychiatric: He has a normal mood and affect. His behavior is normal.  Nursing note and vitals reviewed.     Assessment & Plan:   Please see Problem Oriented Charting.

## 2015-01-08 ENCOUNTER — Other Ambulatory Visit: Payer: Self-pay | Admitting: Internal Medicine

## 2015-01-08 DIAGNOSIS — I10 Essential (primary) hypertension: Secondary | ICD-10-CM

## 2015-01-26 ENCOUNTER — Other Ambulatory Visit: Payer: Self-pay | Admitting: Internal Medicine

## 2015-01-26 DIAGNOSIS — I10 Essential (primary) hypertension: Secondary | ICD-10-CM

## 2015-01-26 DIAGNOSIS — E785 Hyperlipidemia, unspecified: Secondary | ICD-10-CM

## 2015-02-03 ENCOUNTER — Encounter: Payer: Self-pay | Admitting: *Deleted

## 2015-02-22 ENCOUNTER — Other Ambulatory Visit: Payer: Self-pay | Admitting: Internal Medicine

## 2015-02-22 DIAGNOSIS — IMO0001 Reserved for inherently not codable concepts without codable children: Secondary | ICD-10-CM

## 2015-04-09 ENCOUNTER — Ambulatory Visit: Payer: Medicare Other | Admitting: Internal Medicine

## 2015-04-16 ENCOUNTER — Ambulatory Visit (INDEPENDENT_AMBULATORY_CARE_PROVIDER_SITE_OTHER): Payer: Medicare Other | Admitting: Internal Medicine

## 2015-04-16 ENCOUNTER — Encounter: Payer: Self-pay | Admitting: Internal Medicine

## 2015-04-16 VITALS — BP 118/75 | HR 87 | Temp 98.2°F | Ht 62.0 in | Wt 143.1 lb

## 2015-04-16 DIAGNOSIS — R809 Proteinuria, unspecified: Secondary | ICD-10-CM | POA: Diagnosis not present

## 2015-04-16 DIAGNOSIS — E785 Hyperlipidemia, unspecified: Secondary | ICD-10-CM | POA: Diagnosis not present

## 2015-04-16 DIAGNOSIS — E1169 Type 2 diabetes mellitus with other specified complication: Secondary | ICD-10-CM

## 2015-04-16 DIAGNOSIS — Z6826 Body mass index (BMI) 26.0-26.9, adult: Secondary | ICD-10-CM | POA: Diagnosis not present

## 2015-04-16 DIAGNOSIS — Z79899 Other long term (current) drug therapy: Secondary | ICD-10-CM | POA: Diagnosis not present

## 2015-04-16 DIAGNOSIS — E663 Overweight: Secondary | ICD-10-CM | POA: Diagnosis not present

## 2015-04-16 DIAGNOSIS — IMO0001 Reserved for inherently not codable concepts without codable children: Secondary | ICD-10-CM

## 2015-04-16 DIAGNOSIS — I10 Essential (primary) hypertension: Secondary | ICD-10-CM

## 2015-04-16 DIAGNOSIS — Z0000 Encounter for general adult medical examination without abnormal findings: Secondary | ICD-10-CM

## 2015-04-16 LAB — POCT GLYCOSYLATED HEMOGLOBIN (HGB A1C): HEMOGLOBIN A1C: 7.2

## 2015-04-16 LAB — GLUCOSE, CAPILLARY: GLUCOSE-CAPILLARY: 144 mg/dL — AB (ref 65–99)

## 2015-04-16 NOTE — Assessment & Plan Note (Signed)
His blood pressure control today is excellent at 118/75 on hydrochlorothiazide 12.5 mg by mouth daily and metoprolol XL 12.5 mg by mouth daily. His weight loss has also likely contributed to the excellent control of his essential hypertension. We will continue the hydrochlorothiazide and metoprolol at the current dose and encourage continued weight loss. If he is able to achieve a normal body weight we may be able to control his blood pressure with a single antihypertensive agent.

## 2015-04-16 NOTE — Assessment & Plan Note (Signed)
He and his sister or not currently interested in HIV screening or colon cancer screening at this time. He has a stye on the left upper eyelid and was encouraged to use warm compresses. His sister requested a list of dentists who accept Medicare and this was provided to them. She will look into this list and consider contacting one of the dentists for dental care. He is otherwise up-to-date on his health maintenance.

## 2015-04-16 NOTE — Assessment & Plan Note (Signed)
He continues to tolerate the simvastatin 20 mg by mouth daily without myalgias. We will continue with this moderate intensity statin given his underlying diabetes. Current guidelines do not recommend following serial LDL is well on the appropriate intensity statin.

## 2015-04-16 NOTE — Progress Notes (Signed)
   Subjective:    Patient ID: Larry Wiley, male    DOB: 1966/08/01, 49 y.o.   MRN: [id number]  HPI  Larry Wiley is here for follow-up of diabetes and weight. Please see the A&P for the status of the pt's chronic medical problems.  Review of Systems  Constitutional: Negative for activity change, appetite change and unexpected weight change.  Eyes: Negative for photophobia, pain, discharge and redness.  Respiratory: Negative for cough, chest tightness and shortness of breath.   Cardiovascular: Negative for chest pain, palpitations and leg swelling.  Gastrointestinal: Negative for nausea, vomiting, abdominal pain, diarrhea, constipation and abdominal distention.  Musculoskeletal: Negative for myalgias, back pain, joint swelling, arthralgias, gait problem, neck pain and neck stiffness.  Skin: Negative for color change, rash and wound.  Psychiatric/Behavioral: Negative for dysphoric mood. The patient is not nervous/anxious.       Objective:   Physical Exam  Constitutional: He is oriented to person, place, and time. He appears well-developed and well-nourished. No distress.  HENT:  Head: Normocephalic and atraumatic.  Eyes: Conjunctivae are normal. Right eye exhibits no discharge. Left eye exhibits no discharge. No scleral icterus.  Stye on left upper eyelid.  Neck: Normal range of motion. Neck supple. No tracheal deviation present.  Cardiovascular: Normal rate, regular rhythm and normal heart sounds.  Exam reveals no gallop and no friction rub.   No murmur heard. Pulmonary/Chest: Effort normal and breath sounds normal. No stridor. No respiratory distress. He has no wheezes. He has no rales.  Abdominal: Soft. Bowel sounds are normal. He exhibits no distension. There is no tenderness. There is no rebound and no guarding.  Musculoskeletal: Normal range of motion. He exhibits no edema or tenderness.  Lymphadenopathy:    He has no cervical adenopathy.  Neurological: He is alert and  oriented to person, place, and time. He exhibits normal muscle tone.  Skin: Skin is warm and dry. No rash noted. He is not diaphoretic. No erythema.  Psychiatric: He has a normal mood and affect. His behavior is normal.  Nursing note and vitals reviewed.     Assessment & Plan:   Please see problem oriented charting.

## 2015-04-16 NOTE — Assessment & Plan Note (Signed)
His hemoglobin A1c today was 7.2 down from 7.3 at the last visit. This is on glipizide XL 5 mg by mouth daily and metformin 1000 mg by mouth twice daily. He has decreased the amount of potatoes and other starches in his diet and increased the amount of green beans. He also continues to walk around the neighborhood and mow the lawn and walking his dog [place]. With all of this he has lost 4 pounds since the last visit and this likely has contributed to the slight improvement in his diabetic control. He was encouraged to continue working on weight loss by avoiding starchy foods and continue to be as active as he has been. We will reassess his diabetic control with a hemoglobin A1c at the return visit.

## 2015-04-16 NOTE — Assessment & Plan Note (Signed)
As noted above he has lost 4 pounds in the last 4 months related to decreasing his starches, increasing his other vegetables, and maintaining activity by mowing the lawn, walking around the neighborhood, and walking his dog. He was praised for this achievement and encouraged to continue with weight loss as this will help both his diabetes and his hypertension.

## 2015-04-16 NOTE — Patient Instructions (Signed)
It was great to see you again.  You are doing great with your health.  I am so proud of you for loosing the 4 pounds in 3 months.  1) Keep taking the medications as you are.  2) Keep with your diet of eating more green beans and less potatoes.  3) Keep up with the exercise including mowing the lawn and walking [place].  4) Take a look at the list of dentists that accept Medicare and see if you may be interested in going to one.  I will see you in 6 months, sooner if necessary.

## 2015-08-04 DIAGNOSIS — H01021 Squamous blepharitis right upper eyelid: Secondary | ICD-10-CM | POA: Diagnosis not present

## 2015-08-04 DIAGNOSIS — H01022 Squamous blepharitis right lower eyelid: Secondary | ICD-10-CM | POA: Diagnosis not present

## 2015-08-04 DIAGNOSIS — L718 Other rosacea: Secondary | ICD-10-CM | POA: Diagnosis not present

## 2015-08-04 DIAGNOSIS — L719 Rosacea, unspecified: Secondary | ICD-10-CM | POA: Diagnosis not present

## 2015-08-04 DIAGNOSIS — H00024 Hordeolum internum left upper eyelid: Secondary | ICD-10-CM | POA: Diagnosis not present

## 2015-08-04 DIAGNOSIS — H01024 Squamous blepharitis left upper eyelid: Secondary | ICD-10-CM | POA: Diagnosis not present

## 2015-08-04 DIAGNOSIS — H01025 Squamous blepharitis left lower eyelid: Secondary | ICD-10-CM | POA: Diagnosis not present

## 2015-08-13 DIAGNOSIS — R112 Nausea with vomiting, unspecified: Secondary | ICD-10-CM | POA: Diagnosis not present

## 2015-08-13 DIAGNOSIS — R110 Nausea: Secondary | ICD-10-CM | POA: Diagnosis not present

## 2015-08-13 DIAGNOSIS — R1084 Generalized abdominal pain: Secondary | ICD-10-CM | POA: Diagnosis not present

## 2015-08-14 ENCOUNTER — Emergency Department (HOSPITAL_COMMUNITY): Payer: Medicare Other

## 2015-08-14 ENCOUNTER — Encounter (HOSPITAL_COMMUNITY): Payer: Self-pay | Admitting: Nurse Practitioner

## 2015-08-14 ENCOUNTER — Inpatient Hospital Stay (HOSPITAL_COMMUNITY)
Admission: EM | Admit: 2015-08-14 | Discharge: 2015-08-16 | DRG: 392 | Disposition: A | Discharge status: Home / Self Care | Payer: Medicare Other | Attending: Internal Medicine | Admitting: Internal Medicine

## 2015-08-14 ENCOUNTER — Other Ambulatory Visit: Payer: Self-pay

## 2015-08-14 DIAGNOSIS — E876 Hypokalemia: Secondary | ICD-10-CM | POA: Diagnosis present

## 2015-08-14 DIAGNOSIS — R809 Proteinuria, unspecified: Secondary | ICD-10-CM

## 2015-08-14 DIAGNOSIS — F79 Unspecified intellectual disabilities: Secondary | ICD-10-CM | POA: Diagnosis present

## 2015-08-14 DIAGNOSIS — Z888 Allergy status to other drugs, medicaments and biological substances status: Secondary | ICD-10-CM | POA: Diagnosis not present

## 2015-08-14 DIAGNOSIS — R7989 Other specified abnormal findings of blood chemistry: Secondary | ICD-10-CM | POA: Diagnosis not present

## 2015-08-14 DIAGNOSIS — R1110 Vomiting, unspecified: Secondary | ICD-10-CM

## 2015-08-14 DIAGNOSIS — E118 Type 2 diabetes mellitus with unspecified complications: Secondary | ICD-10-CM | POA: Diagnosis not present

## 2015-08-14 DIAGNOSIS — R161 Splenomegaly, not elsewhere classified: Secondary | ICD-10-CM | POA: Diagnosis not present

## 2015-08-14 DIAGNOSIS — K219 Gastro-esophageal reflux disease without esophagitis: Secondary | ICD-10-CM | POA: Diagnosis present

## 2015-08-14 DIAGNOSIS — H00019 Hordeolum externum unspecified eye, unspecified eyelid: Secondary | ICD-10-CM | POA: Diagnosis present

## 2015-08-14 DIAGNOSIS — E785 Hyperlipidemia, unspecified: Secondary | ICD-10-CM | POA: Diagnosis not present

## 2015-08-14 DIAGNOSIS — Z881 Allergy status to other antibiotic agents status: Secondary | ICD-10-CM | POA: Diagnosis not present

## 2015-08-14 DIAGNOSIS — E871 Hypo-osmolality and hyponatremia: Secondary | ICD-10-CM | POA: Diagnosis not present

## 2015-08-14 DIAGNOSIS — R748 Abnormal levels of other serum enzymes: Secondary | ICD-10-CM | POA: Diagnosis not present

## 2015-08-14 DIAGNOSIS — I10 Essential (primary) hypertension: Secondary | ICD-10-CM | POA: Diagnosis present

## 2015-08-14 DIAGNOSIS — E1169 Type 2 diabetes mellitus with other specified complication: Secondary | ICD-10-CM | POA: Diagnosis present

## 2015-08-14 DIAGNOSIS — R5383 Other fatigue: Secondary | ICD-10-CM | POA: Diagnosis not present

## 2015-08-14 DIAGNOSIS — D72819 Decreased white blood cell count, unspecified: Secondary | ICD-10-CM | POA: Diagnosis present

## 2015-08-14 DIAGNOSIS — D709 Neutropenia, unspecified: Secondary | ICD-10-CM | POA: Diagnosis not present

## 2015-08-14 DIAGNOSIS — R112 Nausea with vomiting, unspecified: Secondary | ICD-10-CM | POA: Diagnosis not present

## 2015-08-14 DIAGNOSIS — Z7984 Long term (current) use of oral hypoglycemic drugs: Secondary | ICD-10-CM

## 2015-08-14 DIAGNOSIS — D696 Thrombocytopenia, unspecified: Secondary | ICD-10-CM | POA: Diagnosis not present

## 2015-08-14 DIAGNOSIS — Z7982 Long term (current) use of aspirin: Secondary | ICD-10-CM | POA: Diagnosis not present

## 2015-08-14 DIAGNOSIS — Z79899 Other long term (current) drug therapy: Secondary | ICD-10-CM

## 2015-08-14 DIAGNOSIS — E1129 Type 2 diabetes mellitus with other diabetic kidney complication: Secondary | ICD-10-CM

## 2015-08-14 DIAGNOSIS — E872 Acidosis: Secondary | ICD-10-CM | POA: Diagnosis present

## 2015-08-14 DIAGNOSIS — N289 Disorder of kidney and ureter, unspecified: Secondary | ICD-10-CM | POA: Diagnosis not present

## 2015-08-14 DIAGNOSIS — R42 Dizziness and giddiness: Secondary | ICD-10-CM | POA: Diagnosis not present

## 2015-08-14 DIAGNOSIS — Z8249 Family history of ischemic heart disease and other diseases of the circulatory system: Secondary | ICD-10-CM | POA: Diagnosis not present

## 2015-08-14 DIAGNOSIS — K529 Noninfective gastroenteritis and colitis, unspecified: Principal | ICD-10-CM | POA: Diagnosis present

## 2015-08-14 DIAGNOSIS — R945 Abnormal results of liver function studies: Secondary | ICD-10-CM

## 2015-08-14 LAB — CBC
HCT: 42.7 % (ref 39.0–52.0)
Hemoglobin: 15.3 g/dL (ref 13.0–17.0)
MCH: 28.9 pg (ref 26.0–34.0)
MCHC: 35.8 g/dL (ref 30.0–36.0)
MCV: 80.6 fL (ref 78.0–100.0)
Platelets: 65 10*3/uL — ABNORMAL LOW (ref 150–400)
RBC: 5.3 MIL/uL (ref 4.22–5.81)
RDW: 12.3 % (ref 11.5–15.5)
WBC: 2.9 10*3/uL — ABNORMAL LOW (ref 4.0–10.5)

## 2015-08-14 LAB — COMPREHENSIVE METABOLIC PANEL
ALT: 95 U/L — ABNORMAL HIGH (ref 17–63)
AST: 102 U/L — ABNORMAL HIGH (ref 15–41)
Albumin: 3.8 g/dL (ref 3.5–5.0)
Alkaline Phosphatase: 44 U/L (ref 38–126)
Anion gap: 16 — ABNORMAL HIGH (ref 5–15)
BUN: 14 mg/dL (ref 6–20)
CO2: 21 mmol/L — ABNORMAL LOW (ref 22–32)
Calcium: 8.4 mg/dL — ABNORMAL LOW (ref 8.9–10.3)
Chloride: 85 mmol/L — ABNORMAL LOW (ref 101–111)
Creatinine, Ser: 1.07 mg/dL (ref 0.61–1.24)
GFR calc Af Amer: >60 mL/min (ref >60)
GFR calc non Af Amer: >60 mL/min (ref >60)
Glucose, Bld: 286 mg/dL — ABNORMAL HIGH (ref 65–99)
Potassium: 3.9 mmol/L (ref 3.5–5.1)
Sodium: 122 mmol/L — ABNORMAL LOW (ref 135–145)
Total Bilirubin: 1.6 mg/dL — ABNORMAL HIGH (ref 0.3–1.2)
Total Protein: 7.2 g/dL (ref 6.5–8.1)

## 2015-08-14 LAB — LACTIC ACID, PLASMA
LACTIC ACID, VENOUS: 2.5 mmol/L — AB (ref 0.5–2.0)
Lactic Acid, Venous: 2.5 mmol/L (ref 0.5–2.0)

## 2015-08-14 LAB — CORTISOL: Cortisol, Plasma: 45 ug/dL

## 2015-08-14 LAB — TSH: TSH: 1.336 u[IU]/mL (ref 0.350–4.500)

## 2015-08-14 LAB — GLUCOSE, CAPILLARY: Glucose-Capillary: 165 mg/dL — ABNORMAL HIGH (ref 65–99)

## 2015-08-14 LAB — PROTIME-INR
INR: 1.15 (ref 0.00–1.49)
PROTHROMBIN TIME: 14.9 s (ref 11.6–15.2)

## 2015-08-14 LAB — OSMOLALITY: Osmolality: 273 mOsm/kg — ABNORMAL LOW (ref 275–300)

## 2015-08-14 LAB — SAVE SMEAR

## 2015-08-14 LAB — URIC ACID: Uric Acid, Serum: 5.7 mg/dL (ref 4.4–7.6)

## 2015-08-14 LAB — APTT: APTT: 28 s (ref 24–37)

## 2015-08-14 LAB — TECHNOLOGIST SMEAR REVIEW

## 2015-08-14 LAB — LIPASE, BLOOD: Lipase: 82 U/L — ABNORMAL HIGH (ref 11–51)

## 2015-08-14 MED ORDER — ONDANSETRON HCL 4 MG/2ML IJ SOLN: 4.0000 mg | Freq: Four times a day (QID) | INTRAMUSCULAR | Status: DC | PRN

## 2015-08-14 MED ORDER — INSULIN ASPART 100 UNIT/ML ~~LOC~~ SOLN: 0.0000 [IU] | Freq: Every day | SUBCUTANEOUS | Status: DC

## 2015-08-14 MED ORDER — METOPROLOL SUCCINATE ER 25 MG PO TB24
12.5000 mg | ORAL_TABLET | Freq: Every day | ORAL | Status: DC
  Administered 2015-08-14 – 2015-08-16 (×3): 12.5 mg via ORAL
  Filled 2015-08-14 (×3): qty 1

## 2015-08-14 MED ORDER — SODIUM CHLORIDE 0.9 % IV SOLN
INTRAVENOUS | Status: DC
  Administered 2015-08-14 – 2015-08-16 (×3): via INTRAVENOUS

## 2015-08-14 MED ORDER — ONDANSETRON HCL 4 MG PO TABS: 4.0000 mg | ORAL_TABLET | Freq: Four times a day (QID) | ORAL | Status: DC | PRN

## 2015-08-14 MED ORDER — ACETAMINOPHEN 325 MG PO TABS: 650.0000 mg | ORAL_TABLET | Freq: Four times a day (QID) | ORAL | Status: DC | PRN

## 2015-08-14 MED ORDER — IOHEXOL 300 MG/ML  SOLN: 25.0000 mL | Freq: Once | INTRAMUSCULAR | Status: DC | PRN

## 2015-08-14 MED ORDER — ACETAMINOPHEN 650 MG RE SUPP: 650.0000 mg | Freq: Four times a day (QID) | RECTAL | Status: DC | PRN

## 2015-08-14 MED ORDER — SIMVASTATIN 20 MG PO TABS
20.0000 mg | ORAL_TABLET | Freq: Every day | ORAL | Status: DC
  Administered 2015-08-15: 20 mg via ORAL
  Filled 2015-08-14: qty 1

## 2015-08-14 MED ORDER — ASPIRIN EC 81 MG PO TBEC: 81.0000 mg | DELAYED_RELEASE_TABLET | Freq: Every day | ORAL | Status: DC

## 2015-08-14 MED ORDER — ONDANSETRON HCL 4 MG/2ML IJ SOLN: 4.0000 mg | Freq: Three times a day (TID) | INTRAMUSCULAR | Status: DC | PRN

## 2015-08-14 MED ORDER — INSULIN ASPART 100 UNIT/ML ~~LOC~~ SOLN
0.0000 [IU] | Freq: Three times a day (TID) | SUBCUTANEOUS | Status: DC
  Administered 2015-08-15: 3 [IU] via SUBCUTANEOUS
  Administered 2015-08-15 – 2015-08-16 (×2): 1 [IU] via SUBCUTANEOUS
  Administered 2015-08-16: 2 [IU] via SUBCUTANEOUS

## 2015-08-14 MED ORDER — ONDANSETRON HCL 4 MG/2ML IJ SOLN
4.0000 mg | Freq: Once | INTRAMUSCULAR | Status: AC
  Administered 2015-08-14: 4 mg via INTRAVENOUS
  Filled 2015-08-14: qty 2

## 2015-08-14 MED ORDER — SODIUM CHLORIDE 0.9 % IV BOLUS (SEPSIS)
1000.0000 mL | Freq: Once | INTRAVENOUS | Status: AC
  Administered 2015-08-14: 1000 mL via INTRAVENOUS

## 2015-08-14 MED ORDER — IOHEXOL 300 MG/ML  SOLN
25.0000 mL | Freq: Once | INTRAMUSCULAR | Status: AC | PRN
  Administered 2015-08-14: 25 mL via ORAL

## 2015-08-14 NOTE — ED Notes (Signed)
Report attempted x 1

## 2015-08-14 NOTE — ED Provider Notes (Signed)
CSN: [id number]     Arrival date & time 08/14/15  1344 History   First MD Initiated Contact with Patient 08/14/15 1405     Chief Complaint  Patient presents with  . Emesis     (Consider location/radiation/quality/duration/timing/severity/associated sxs/prior Treatment) HPI   49yM brought in by family for evaluation of n/v. Pt has some degree of MR and history primarily from family at bedside. Symptom onset Tuesday. Listless. Poor appetite when generally very good eater. Did not want to watch wrestling this week which is unlike him. Occasional cough noted at night. Has felt warm. No recorded fever. No diarrhea. No sick contacts. Has been on doxycycline since 10/12 prescribed by opthalmology for stye. Seen at [facility] yesterday and diagnosed with an "infection." Told to stop doxycycline and started on keflex. Not clear what exactly what the indication for this was though. Mother reports negative flu and also had blood work. Told that platelets were 77,000. No diagnosed history of thrombocytopenia. No unusual bruising, bleeding, melena.  No recent medication changes aside from aforementioned doxy/keflex. Pt himself has no complaints. Says has felt sick to his stomach recently, but not currently. Denies any pain anywhere. No urinary complaints.   Past Medical History  Diagnosis Date  . Type 2 diabetes mellitus with proteinuria or microalbuminuria 08/18/2006  . Essential hypertension 08/18/2006  . Hyperlipidemia LDL goal < 100 05/03/2007  . Gastroesophageal reflux disease 09/27/2012    Intermittent symptoms, does not want therapy   . Open-angle glaucoma 07/25/2013  . Overweight (BMI 25.0-29.9) 09/27/2012  . Mental retardation 08/18/2006  . Bilateral inguinal hernia    History reviewed. No pertinent past surgical history. Family History  Problem Relation Age of Onset  . Hyperlipidemia Mother   . Hypertension Mother   . Liver cancer Mother   . Diabetes Father   . Heart attack Father    Social  History  Substance Use Topics  . Smoking status: Never Smoker   . Smokeless tobacco: Never Used  . Alcohol Use: No    Review of Systems  All systems reviewed and negative, other than as noted in HPI.   Allergies  Ace inhibitors; Amoxicillin; and Amoxicillin-pot clavulanate  Home Medications   Prior to Admission medications   Medication Sig Start Date End Date Taking? Authorizing Provider  aspirin 81 MG EC tablet Take 81 mg by mouth daily.      Historical Provider, MD  glipiZIDE (GLUCOTROL XL) 5 MG 24 hr tablet Take 1 tablet (5 mg total) by mouth daily. 09/04/14   Doneen Poisson, MD  hydrochlorothiazide (MICROZIDE) 12.5 MG capsule Take 1 capsule (12.5 mg total) by mouth daily. 01/27/15   Doneen Poisson, MD  latanoprost (XALATAN) 0.005 % ophthalmic solution Place 1 drop into both eyes at bedtime. 09/04/14 09/04/15  Doneen Poisson, MD  metFORMIN (GLUCOPHAGE) 1000 MG tablet Take 1 tablet (1,000 mg total) by mouth 2 (two) times daily with a meal. 02/25/15   Doneen Poisson, MD  metoprolol succinate (TOPROL-XL) 25 MG 24 hr tablet Take 0.5 tablets (12.5 mg total) by mouth daily. 01/08/15   Doneen Poisson, MD  simvastatin (ZOCOR) 20 MG tablet Take 1 tablet (20 mg total) by mouth daily at 6 PM. 01/27/15   Doneen Poisson, MD   BP 124/75 mmHg  Pulse 103  Temp(Src) 98.5 F (36.9 C) (Oral)  Resp 18  SpO2 98% Physical Exam  Constitutional: He appears well-developed and well-nourished. No distress.  HENT:  Head: Normocephalic and atraumatic.  Eyes: Conjunctivae are normal. Right eye  exhibits no discharge. Left eye exhibits no discharge.  Neck: Neck supple.  Cardiovascular: Regular rhythm and normal heart sounds.  Exam reveals no gallop and no friction rub.   No murmur heard. Mild tachycardia  Pulmonary/Chest: Effort normal and breath sounds normal. No respiratory distress.  Abdominal: Soft. He exhibits no distension. There is no tenderness.  Musculoskeletal: He exhibits no edema or tenderness.   Skin: Skin is warm and dry. Rash noted.  Sparse petechial rash abdomen and chest. Some noted on b/l shins. No lesion noted on palms or soles.   Psychiatric: He has a normal mood and affect. His behavior is normal. Thought content normal.  Nursing note and vitals reviewed.   ED Course  Procedures (including critical care time) Labs Review Labs Reviewed  COMPREHENSIVE METABOLIC PANEL - Abnormal; Notable for the following:    Sodium 122 (*)    Chloride 85 (*)    CO2 21 (*)    Glucose, Bld 286 (*)    Calcium 8.4 (*)    AST 102 (*)    ALT 95 (*)    Total Bilirubin 1.6 (*)    Anion gap 16 (*)    All other components within normal limits  CBC - Abnormal; Notable for the following:    WBC 2.9 (*)    Platelets 65 (*)    All other components within normal limits  LACTIC ACID, PLASMA - Abnormal; Notable for the following:    Lactic Acid, Venous 2.5 (*)    All other components within normal limits  CULTURE, BLOOD (ROUTINE X 2)  CULTURE, BLOOD (ROUTINE X 2)  SAVE SMEAR  CORTISOL  URIC ACID  URINALYSIS, ROUTINE W REFLEX MICROSCOPIC (NOT AT [facility])  LACTIC ACID, PLASMA  UREA NITROGEN, URINE  CREATININE, URINE, RANDOM  TSH  SODIUM, URINE, RANDOM  OSMOLALITY, URINE  OSMOLALITY  URIC ACID, RANDOM URINE    Imaging Review Dg Chest 2 View  08/14/2015  CLINICAL DATA:  Vomiting, loss of appetite, lethargy EXAM: CHEST  2 VIEW COMPARISON:  None. FINDINGS: Lungs are clear.  No pleural effusion or pneumothorax. The heart is normal in size. Visualized osseous structures are within normal limits. IMPRESSION: Normal chest radiographs. Electronically Signed   By: Charline BillsSriyesh  Krishnan M.D.   On: 08/14/2015 16:15   I have personally reviewed and evaluated these images and lab results as part of my medical decision-making.   EKG Interpretation None      MDM   Final diagnoses:  Hyponatremia  Non-intractable vomiting with nausea, vomiting of unspecified type  Thrombocytopenia (HCC)    49yM  with general malaise and n/v since Tuesday. Significant hyponatremia. Recent n/v, poor PO intake and on HCTZ. Tired/"listless" but family reports not really confused. Additional urine and blood studies sent. Will check rectal temp. Thrombocytopenia and leukopenia. Not anemic. No diagnosed past history of thrombocytopenia. Mother reports told platelets 77,000 on blood work done at urgent care yesterday. Need differential. Smear. Scattered petechial rash on abdomen/chest.  No gingival bleeding, melena, bruising. Consider RMSF with rash, thrombocytopenia, hyponatremia, elevated LFTs. Not typical distribution of rash though. No recent known tick bite and has actually been taking doxycycline for stye. Placed on keflex yesterday, but not exactly sure what was being treated. Reportedly checked for flu and negative.    Raeford RazorStephen Leeta Grimme, MD 08/14/15 [phone]

## 2015-08-14 NOTE — H&P (Signed)
Resident H&P Internal Medicine  CC: nausea and vomiting   HPI:  49 yo patient with mild-moderate autism, NIDDM, HLD, HTN, who presented with nausea and vomiting for 5 days. Sister provided most of the history and said Monday night he was listless. His last meal was Monday night when he ate black eyed beans and chicken. After that, he has had n/v ever day. Friday night, he started vomiting all night every hour until 5 AM and reported it was clear, and no blood. Pt was very lightheaded and had a headache.  He denied any diarrhea or constipation.   He had a stye and was given doxycycline on 10/12 by Dr [facility]. Then he went to urgent care in [place] yesterday and his doxycycline was changed to Keflex and he was given PPI and zofran.  He reported no travel history, no tick bites, Occasional cough noted at night. Has felt warm. No recorded fever  [facility] 77,000. Denies any bleeding, bruising or petechiae, or melena or hematochezia.   FH of T2Dm and HTn SH: no alcohol, no smoking, no drinking.  Meds: Patient is on simvastatin 20 mg daily, glipizide 5 mg daily, HCTZ 12.5 mg daily, metformin 1000 mg BID, and metoprolol 12.5 mg daily.   ROS:  ROS: General: no fevers, chills, changes in weight, changes in appetite Skin: has macules rash HEENT: no blurry vision, hearing changes, sore throat Pulm: no dyspnea, coughing, wheezing CV: no chest pain, palpitations, shortness of breath Abd: +abdominal pain, +nausea/vomiting, no diarrhea/constipation GU: no dysuria, hematuria, polyuria Ext: no arthralgias, myalgias Neuro: no weakness, numbness, or tingling    Physical Exam Vital signs in last 24 hours: Filed Vitals:   08/14/15 1530 08/14/15 1615 08/14/15 1645 08/14/15 1808  BP: 124/79 130/76 128/72 132/80  Pulse: 105 99 97 107  Temp:    98.1 F (36.7 C)  TempSrc:    Oral  Resp: 28 25 17 16   Height:    5\' 2"  (1.575 m)  Weight:    139 lb 4.8 oz (63.186 kg)   SpO2: 97% 97% 97% 98%   Weight change:   Intake/Output Summary (Last 24 hours) at 08/14/15 2040 Last data filed at 08/14/15 1653  Gross per 24 hour  Intake   1000 ml  Output      0 ml  Net   1000 ml   General: A&O to name, moderately autistic, lying in bed HEENT: EOMI Neck: supple, midline trachea CV: RRR, normal s1, s2, no m/r/g,  Resp: equal and symmetric breath sounds, no wheezing heard Abdomen: soft, nontender, distended, hypoactive bowel sounds, tympany to percussion  GU: no CVA tenderness Skin: warm, dry, intact, no open lesions or rashes noted, has petechiae  Extremities: pulses intact b/l, no edema, clubbing or cyanosis, no calf tenderness Neurologic: no focal neuro deficits    Lab Results: Results for orders placed or performed during the hospital encounter of 08/14/15 (from the past 24 hour(s))  Comprehensive metabolic panel     Status: Abnormal   Collection Time: 08/14/15  2:27 PM  Result Value Ref Range   Sodium 122 (L) 135 - 145 mmol/L   Potassium 3.9 3.5 - 5.1 mmol/L   Chloride 85 (L) 101 - 111 mmol/L   CO2 21 (L) 22 - 32 mmol/L   Glucose, Bld 286 (H) 65 - 99 mg/dL   BUN 14 6 - 20 mg/dL   Creatinine, Ser 1.191.07 0.61 - 1.24 mg/dL   Calcium 8.4 (L)  8.9 - 10.3 mg/dL   Total Protein 7.2 6.5 - 8.1 g/dL   Albumin 3.8 3.5 - 5.0 g/dL   AST 811 (H) 15 - 41 U/L   ALT 95 (H) 17 - 63 U/L   Alkaline Phosphatase 44 38 - 126 U/L   Total Bilirubin 1.6 (H) 0.3 - 1.2 mg/dL   GFR calc non Af Amer >60 >60 mL/min   GFR calc Af Amer >60 >60 mL/min   Anion gap 16 (H) 5 - 15  CBC     Status: Abnormal   Collection Time: 08/14/15  2:27 PM  Result Value Ref Range   WBC 2.9 (L) 4.0 - 10.5 K/uL   RBC 5.30 4.22 - 5.81 MIL/uL   Hemoglobin 15.3 13.0 - 17.0 g/dL   HCT 91.4 78.2 - 95.6 %   MCV 80.6 78.0 - 100.0 fL   MCH 28.9 26.0 - 34.0 pg   MCHC 35.8 30.0 - 36.0 g/dL   RDW 21.3 08.6 - 57.8 %   Platelets 65 (L) 150 - 400 K/uL  Save smear     Status: None   Collection Time:  08/14/15  3:41 PM  Result Value Ref Range   Smear Review SMEAR STAINED AND AVAILABLE FOR REVIEW   TSH     Status: None   Collection Time: 08/14/15  3:41 PM  Result Value Ref Range   TSH 1.336 0.350 - 4.500 uIU/mL  Cortisol     Status: None   Collection Time: 08/14/15  3:41 PM  Result Value Ref Range   Cortisol, Plasma 45.0 ug/dL  Lactic acid, plasma     Status: Abnormal   Collection Time: 08/14/15  3:42 PM  Result Value Ref Range   Lactic Acid, Venous 2.5 (HH) 0.5 - 2.0 mmol/L  Osmolality     Status: Abnormal   Collection Time: 08/14/15  3:42 PM  Result Value Ref Range   Osmolality 273 (L) 275 - 300 mOsm/kg  Uric acid     Status: None   Collection Time: 08/14/15  3:42 PM  Result Value Ref Range   Uric Acid, Serum 5.7 4.4 - 7.6 mg/dL  Lactic acid, plasma     Status: Abnormal   Collection Time: 08/14/15  7:05 PM  Result Value Ref Range   Lactic Acid, Venous 2.5 (HH) 0.5 - 2.0 mmol/L  Lipase, blood     Status: Abnormal   Collection Time: 08/14/15  7:57 PM  Result Value Ref Range   Lipase 82 (H) 11 - 51 U/L  Glucose, capillary     Status: Abnormal   Collection Time: 08/14/15  8:40 PM  Result Value Ref Range   Glucose-Capillary 165 (H) 65 - 99 mg/dL   Comment 1 Notify RN    Comment 2 Document in Chart     Micro Results: No results found for this or any previous visit (from the past 240 hour(s)). Studies/Results: Dg Chest 2 View  08/14/2015  CLINICAL DATA:  Vomiting, loss of appetite, lethargy EXAM: CHEST  2 VIEW COMPARISON:  None. FINDINGS: Lungs are clear.  No pleural effusion or pneumothorax. The heart is normal in size. Visualized osseous structures are within normal limits. IMPRESSION: Normal chest radiographs. Electronically Signed   By: Charline Bills M.D.   On: 08/14/2015 16:15   Medications: I have reviewed the patient's current medications. Scheduled Meds: . insulin aspart  0-5 Units Subcutaneous QHS  . [START ON 08/15/2015] insulin aspart  0-9 Units  Subcutaneous TID WC  . metoprolol succinate  12.5 mg Oral Daily  . [START ON 08/15/2015] simvastatin  20 mg Oral q1800   Continuous Infusions: . sodium chloride 100 mL/hr at 08/14/15 2034   PRN Meds:.acetaminophen **OR** acetaminophen, ondansetron **OR** ondansetron (ZOFRAN) IV Assessment/Plan: Active Problems:   Hyponatremia   Gastroenteritis  49 yo man with 5 day history of nausea and vomiting, found to have thrombocytopenia and elevated liver enzymes, and hyponatremia.  Nausea with vomiting: n/v for 5 days. Ddx includes viral gastritis, acute hepatitis a,b,c,d, gall bladder thing, HIV, GERD,  . Elevated lipase of 82.  -received 1L IV fluids -NPO -zofran PRN   -ordered orthostatics  -CBC with diff and smear, CMP -ordered CT abdomen with contrast  -Ordered HIV antibody, LDH, PT INR PTT -acute hepatitis panel -lipase -AM cortisol   Moderate Hyponatremia: has extensive differential.  Cirrhosis, primary polydipsia, heart failure, SIADH, hypothyroidism, adrenal insufficiency, etc etc etc....  Serum Na of 122  -on IV fluids -repeat BMET at midnight -goal increase Na by 6 Meq in 1st 24 hours -- ordered Serum osm and urine osm, pending    Elevated liver enzymes and thrombocytopenia: increased AST and ALT and tbili. History of mild elevation in the past, but not checked in 5 years. DDx includes hepatitis, cirrhosis, gallbladder, DIC, ITP, medication induced.   -Platelets 65 -hepatitis panel -abdomen was tympanic to percussion   Lactic acidosis :  Lactic acid of 2.5 - twice. -trending   HTN: stable On metoprolol  HLD: On simvastatin      The patient does have a current PCP Doneen Poisson, MD) and does need an [facility] hospital follow-up appointment after discharge.  The patient does not have transportation limitations that hinder transportation to clinic appointments.  .Services Needed at time of discharge: Y = Yes, Blank = No PT:   OT:   RN:   Equipment:    Other:     LOS: 0 days   Deneise Lever, MD 08/14/2015, 8:40 PM

## 2015-08-14 NOTE — ED Notes (Addendum)
Pt was brought by sister(caregiver), she reports once week history of patient vomiting once a day, not eating well. This morning he began to vomit more and has c/o dizziness since. He would not walk for her. [facility] and given nausea medication but he continues to vomit. [facility] told them that his platelets were low yesterday. He is alert, breathing easily

## 2015-08-14 NOTE — Progress Notes (Addendum)
NURSING PROGRESS NOTE  Name: Larry CreaseJohn Wiley Attending Provider: Oswaldo DoneVincent  Code Status: Full Admitted to the unit at 586:6615pm   49 year old male admitted d/t vomiting and hyponatremia.  -No acute distress noted.  -No complaints of shortness of breath.  -No complaints of chest pain.   Cardiac Monitoring: Box # 20 in place. Cardiac monitor yields:normal sinus rhythm.  T 98.1 P 107 R 16 BP 132/80 02 98 on room air   IV Fluids:pending orders per MD  Allergies: ACE, Amoxicillin  Past Medical History: MR  Past Surgical History:  N/A      Skin: intact   Patient/Family orientated to room. Information packet given to patient/family. Admission inpatient armband information verified with patient/family to include name and date of birth and placed on patient arm. Side rails up x 2, fall assessment and education completed with patient/family. Patient/family able to verbalize understanding of risk associated with falls and verbalized understanding to call for assistance before getting out of bed. Call light within reach. Patient/family able to voice and demonstrate understanding of unit orientation instructions.   Will continue to evaluate and treat per MD orders.

## 2015-08-14 NOTE — Progress Notes (Signed)
CRITICAL VALUE ALERT  Critical value received:  Lactic acid 2.5  Date of notification:  08/14/2015  Time of notification:  8:06 PM  Critical value read back:Yes.    Nurse who received alert:  Idelle JoNikki Tameka Hoiland, RN   MD notified (1st page):  Internal Medicine  Time of first page:  8:08 PM  MD notified (2nd page):  Time of second page:  Responding MD:  Jill AlexandersAlexa Richardson, MD  Time MD responded:  8:09 PM

## 2015-08-15 ENCOUNTER — Encounter (HOSPITAL_COMMUNITY): Payer: Self-pay | Admitting: Radiology

## 2015-08-15 ENCOUNTER — Inpatient Hospital Stay (HOSPITAL_COMMUNITY): Payer: Medicare Other

## 2015-08-15 DIAGNOSIS — D72819 Decreased white blood cell count, unspecified: Secondary | ICD-10-CM

## 2015-08-15 DIAGNOSIS — R748 Abnormal levels of other serum enzymes: Secondary | ICD-10-CM

## 2015-08-15 DIAGNOSIS — R112 Nausea with vomiting, unspecified: Secondary | ICD-10-CM

## 2015-08-15 LAB — HEPATITIS PANEL, ACUTE
HEP A IGM: NEGATIVE
HEP B S AG: NEGATIVE
Hep B C IgM: NEGATIVE

## 2015-08-15 LAB — CBC WITH DIFFERENTIAL/PLATELET
Basophils Absolute: 0 10*3/uL (ref 0.0–0.1)
Basophils Relative: 0 %
EOS ABS: 0.1 10*3/uL (ref 0.0–0.7)
EOS PCT: 3 %
HCT: 35.4 % — ABNORMAL LOW (ref 39.0–52.0)
Hemoglobin: 12.5 g/dL — ABNORMAL LOW (ref 13.0–17.0)
LYMPHS ABS: 0.7 10*3/uL (ref 0.7–4.0)
Lymphocytes Relative: 37 %
MCH: 28.5 pg (ref 26.0–34.0)
MCHC: 35.3 g/dL (ref 30.0–36.0)
MCV: 80.6 fL (ref 78.0–100.0)
MONO ABS: 0.1 10*3/uL (ref 0.1–1.0)
Monocytes Relative: 8 %
NEUTROS PCT: 52 %
Neutro Abs: 0.9 10*3/uL — ABNORMAL LOW (ref 1.7–7.7)
Platelets: 51 10*3/uL — ABNORMAL LOW (ref 150–400)
RBC: 4.39 MIL/uL (ref 4.22–5.81)
RDW: 12.5 % (ref 11.5–15.5)
WBC: 1.8 10*3/uL — AB (ref 4.0–10.5)

## 2015-08-15 LAB — URINALYSIS, ROUTINE W REFLEX MICROSCOPIC
BILIRUBIN URINE: NEGATIVE
GLUCOSE, UA: 250 mg/dL — AB
Hgb urine dipstick: NEGATIVE
KETONES UR: 15 mg/dL — AB
Leukocytes, UA: NEGATIVE
NITRITE: NEGATIVE
PH: 6.5 (ref 5.0–8.0)
PROTEIN: NEGATIVE mg/dL
Specific Gravity, Urine: <1.005 — ABNORMAL LOW (ref 1.005–1.030)
Urobilinogen, UA: 0.2 mg/dL (ref 0.0–1.0)

## 2015-08-15 LAB — COMPREHENSIVE METABOLIC PANEL
ALT: 65 U/L — AB (ref 17–63)
ANION GAP: 9 (ref 5–15)
AST: 63 U/L — AB (ref 15–41)
Albumin: 2.9 g/dL — ABNORMAL LOW (ref 3.5–5.0)
Alkaline Phosphatase: 32 U/L — ABNORMAL LOW (ref 38–126)
BUN: 12 mg/dL (ref 6–20)
CHLORIDE: 94 mmol/L — AB (ref 101–111)
CO2: 22 mmol/L (ref 22–32)
CREATININE: 0.84 mg/dL (ref 0.61–1.24)
Calcium: 7.5 mg/dL — ABNORMAL LOW (ref 8.9–10.3)
Glucose, Bld: 125 mg/dL — ABNORMAL HIGH (ref 65–99)
POTASSIUM: 3.3 mmol/L — AB (ref 3.5–5.1)
Sodium: 125 mmol/L — ABNORMAL LOW (ref 135–145)
Total Bilirubin: 0.9 mg/dL (ref 0.3–1.2)
Total Protein: 5.6 g/dL — ABNORMAL LOW (ref 6.5–8.1)

## 2015-08-15 LAB — LACTIC ACID, PLASMA
LACTIC ACID, VENOUS: 2 mmol/L (ref 0.5–2.0)
LACTIC ACID, VENOUS: 1.5 mmol/L (ref 0.5–2.0)

## 2015-08-15 LAB — LACTATE DEHYDROGENASE: LDH: 335 U/L — AB (ref 98–192)

## 2015-08-15 LAB — BASIC METABOLIC PANEL
ANION GAP: 11 (ref 5–15)
BUN: 13 mg/dL (ref 6–20)
CO2: 22 mmol/L (ref 22–32)
Calcium: 7.6 mg/dL — ABNORMAL LOW (ref 8.9–10.3)
Chloride: 90 mmol/L — ABNORMAL LOW (ref 101–111)
Creatinine, Ser: 0.9 mg/dL (ref 0.61–1.24)
GLUCOSE: 171 mg/dL — AB (ref 65–99)
POTASSIUM: 3.9 mmol/L (ref 3.5–5.1)
Sodium: 123 mmol/L — ABNORMAL LOW (ref 135–145)

## 2015-08-15 LAB — SODIUM, URINE, RANDOM: SODIUM UR: 11 mmol/L

## 2015-08-15 LAB — GLUCOSE, CAPILLARY
GLUCOSE-CAPILLARY: 112 mg/dL — AB (ref 65–99)
GLUCOSE-CAPILLARY: 203 mg/dL — AB (ref 65–99)
Glucose-Capillary: 124 mg/dL — ABNORMAL HIGH (ref 65–99)
Glucose-Capillary: 137 mg/dL — ABNORMAL HIGH (ref 65–99)

## 2015-08-15 LAB — SEDIMENTATION RATE: Sed Rate: 15 mm/hr (ref 0–16)

## 2015-08-15 LAB — MONONUCLEOSIS SCREEN: Mono Screen: NEGATIVE

## 2015-08-15 LAB — HIV ANTIBODY (ROUTINE TESTING W REFLEX): HIV Screen 4th Generation wRfx: NONREACTIVE

## 2015-08-15 LAB — C-REACTIVE PROTEIN: CRP: 0.6 mg/dL (ref <1.0)

## 2015-08-15 LAB — CORTISOL-AM, BLOOD: CORTISOL - AM: 16.4 ug/dL (ref 6.7–22.6)

## 2015-08-15 LAB — OSMOLALITY, URINE: Osmolality, Ur: 401 mOsm/kg (ref 390–1090)

## 2015-08-15 LAB — CREATININE, URINE, RANDOM: CREATININE, URINE: 55.81 mg/dL

## 2015-08-15 MED ORDER — IOHEXOL 300 MG/ML  SOLN
100.0000 mL | Freq: Once | INTRAMUSCULAR | Status: AC | PRN
  Administered 2015-08-15: 100 mL via INTRAVENOUS

## 2015-08-15 MED ORDER — POTASSIUM CHLORIDE CRYS ER 20 MEQ PO TBCR
40.0000 meq | EXTENDED_RELEASE_TABLET | Freq: Once | ORAL | Status: AC
  Administered 2015-08-15: 40 meq via ORAL
  Filled 2015-08-15: qty 2

## 2015-08-15 NOTE — Progress Notes (Signed)
Patient was seen at [facility] [place], [place] I called the clinic for patient's lab results from 08/12/15. The only results back were his CBC: WBC of 2.9 and Platelets 77. The rest of the lab results will be faxed when they are back.  Jill AlexandersAlexa Richardson, DO PGY-2 Internal Medicine Resident Pager # [phone] 08/15/2015 1:36 PM

## 2015-08-15 NOTE — Progress Notes (Signed)
   Subjective:  Patient seen and examined at bedside. He feels better today. He had a 1 week of doxycycline since 10/12 and was switched to keflex 2 days back. He denies any tick bites but [place]  Objective: Vital signs in last 24 hours: Filed Vitals:   08/14/15 1645 08/14/15 1808 08/14/15 2041 08/15/15 0604  BP: 128/72 132/80 127/81 122/67  Pulse: 97 107 109 104  Temp:  98.1 F (36.7 C) 98.4 F (36.9 C) 98.6 F (37 C)  TempSrc:  Oral Oral Oral  Resp: $Remo'17 16 18 20  'gXVmQ$ Height:  $Remove'5\' 2"'bFEPuQo$  (1.575 m)    Weight:  139 lb 4.8 oz (63.186 kg)    SpO2: 97% 98% 77% 97%   Weight change:   Intake/Output Summary (Last 24 hours) at 08/15/15 1000 Last data filed at 08/15/15 0853  Gross per 24 hour  Intake   1990 ml  Output    600 ml  Net   1390 ml   General: Vital signs reviewed. Patient in no acute distress Cardiovascular: regular rate, rhythm, no murmur appreciated  Pulmonary/Chest: Clear to auscultation bilaterally, no wheezes, rales, or rhonchi. Abdominal: Soft, non-tender, non-distended, BS + Extremities: No lower extremity edema bilaterally, pulses symmetric and intact bilaterally. Skin: Warm, dry and intact. No rashes or erythema.   Lab Results: reviewed Meds reviewed   Assessment/Plan:  49 yo man with 5 day history of nausea and vomiting, found to have thrombocytopenia and elevated liver enzymes, and hyponatremia.  Thrombocytopenia with elevated liver enzymes presenting with nausea and vomiting: The acute presentation of the patient's illness is concerning for malignancy,  tickborne illnesses, particularly ehrlichia, anaplasma and rocky mountain spotted fever, also CMV, EBV and other rheumatologic diseases. However, the patient has already received a week of doxycycline since 10/12 and ended last Thursday, so this is the confounding factor. Patient also denies any history of tick bites and denies any new medications apart from  the antibiotics. He also denies any bleeding.  This morning, LDH elevated at 335,  Hepatitis panel negative for A,B,C. His platelets are 55. WBC is 1800 and diff shows absolute neutrophil of 900 and it shows atypical lymphocytes. TSH normal. INR normal. AM cortiol normal. CT abdomen was unrevealing, except splenomegaly and steatosis.   -smear review -daily CMETs and CBCs -ordered ANA, ANCA, ESR,CRP, - ordered Ehrlichia antibody, RMSF IgM -ordered CMV, EBV, and mononucleosis screen -on IV fluids and clear liquid diet -zofran PRN  Moderate Hypotonic Hyponatremia: Patient with 5 day history of nausea and vomiting. Hyponatremia most likely due to extra renal losses, likely GI. AM cortisol normal, and TSH normal.  Na of 125 this morning, after receiving almost 2L of NS iv fluids yesterday and overnight  -Serum osm 273 and urine osm 401 UNa 11, UCr 55.1 ,   -continue on NS at 100 ml/hr  Hypokalemia:  K of 3.4 today -repleted 40 meW  Lactic acidosis : 2.5-->2, resolved     LOS: 1 day   Burgess Estelle, MD 08/15/2015, 10:00 AM

## 2015-08-15 NOTE — Progress Notes (Signed)
Utilization review completed.  

## 2015-08-16 DIAGNOSIS — E118 Type 2 diabetes mellitus with unspecified complications: Secondary | ICD-10-CM

## 2015-08-16 DIAGNOSIS — K259 Gastric ulcer, unspecified as acute or chronic, without hemorrhage or perforation: Secondary | ICD-10-CM

## 2015-08-16 DIAGNOSIS — F79 Unspecified intellectual disabilities: Secondary | ICD-10-CM

## 2015-08-16 DIAGNOSIS — R1110 Vomiting, unspecified: Secondary | ICD-10-CM

## 2015-08-16 DIAGNOSIS — I10 Essential (primary) hypertension: Secondary | ICD-10-CM

## 2015-08-16 DIAGNOSIS — K219 Gastro-esophageal reflux disease without esophagitis: Secondary | ICD-10-CM

## 2015-08-16 DIAGNOSIS — E785 Hyperlipidemia, unspecified: Secondary | ICD-10-CM

## 2015-08-16 DIAGNOSIS — R7989 Other specified abnormal findings of blood chemistry: Secondary | ICD-10-CM | POA: Diagnosis present

## 2015-08-16 DIAGNOSIS — R945 Abnormal results of liver function studies: Secondary | ICD-10-CM

## 2015-08-16 DIAGNOSIS — D696 Thrombocytopenia, unspecified: Secondary | ICD-10-CM | POA: Diagnosis present

## 2015-08-16 DIAGNOSIS — E871 Hypo-osmolality and hyponatremia: Secondary | ICD-10-CM

## 2015-08-16 LAB — COMPREHENSIVE METABOLIC PANEL
ALT: 62 U/L (ref 17–63)
ANION GAP: 5 (ref 5–15)
AST: 61 U/L — AB (ref 15–41)
Albumin: 2.7 g/dL — ABNORMAL LOW (ref 3.5–5.0)
Alkaline Phosphatase: 30 U/L — ABNORMAL LOW (ref 38–126)
BUN: 6 mg/dL (ref 6–20)
CHLORIDE: 103 mmol/L (ref 101–111)
CO2: 25 mmol/L (ref 22–32)
Calcium: 7.8 mg/dL — ABNORMAL LOW (ref 8.9–10.3)
Creatinine, Ser: 0.82 mg/dL (ref 0.61–1.24)
Glucose, Bld: 124 mg/dL — ABNORMAL HIGH (ref 65–99)
POTASSIUM: 3.9 mmol/L (ref 3.5–5.1)
Sodium: 133 mmol/L — ABNORMAL LOW (ref 135–145)
TOTAL PROTEIN: 5.3 g/dL — AB (ref 6.5–8.1)
Total Bilirubin: 0.7 mg/dL (ref 0.3–1.2)

## 2015-08-16 LAB — ACTH: C206 ACTH: 14.6 pg/mL (ref 7.2–63.3)

## 2015-08-16 LAB — CBC
HCT: 33.8 % — ABNORMAL LOW (ref 39.0–52.0)
Hemoglobin: 11.6 g/dL — ABNORMAL LOW (ref 13.0–17.0)
MCH: 28.5 pg (ref 26.0–34.0)
MCHC: 34.3 g/dL (ref 30.0–36.0)
MCV: 83 fL (ref 78.0–100.0)
PLATELETS: 54 10*3/uL — AB (ref 150–400)
RBC: 4.07 MIL/uL — ABNORMAL LOW (ref 4.22–5.81)
RDW: 12.8 % (ref 11.5–15.5)
WBC: 1.6 10*3/uL — AB (ref 4.0–10.5)

## 2015-08-16 LAB — ROCKY MTN SPOTTED FVR ABS PNL(IGG+IGM)
RMSF IGG: NEGATIVE
RMSF IGM: 0.22 {index} (ref 0.00–0.89)

## 2015-08-16 LAB — GLUCOSE, CAPILLARY
GLUCOSE-CAPILLARY: 121 mg/dL — AB (ref 65–99)
GLUCOSE-CAPILLARY: 199 mg/dL — AB (ref 65–99)

## 2015-08-16 LAB — PATHOLOGIST SMEAR REVIEW

## 2015-08-16 LAB — URIC ACID, RANDOM URINE: URIC ACID, URINE: 40 mg/dL

## 2015-08-16 LAB — UREA NITROGEN, URINE: Urea Nitrogen, Ur: 557 mg/dL

## 2015-08-16 NOTE — Progress Notes (Signed)
Subjective:  Patient seen and examined at bedside. No acute events overnight. He did not spike any fevers. Pt feels better, he would like to advance the diet to regulars today Family present in the room.    Objective: Vital signs in last 24 hours: Filed Vitals:   08/15/15 1416 08/15/15 2059 08/16/15 0530 08/16/15 1034  BP: 127/90 143/78 126/76 122/77  Pulse: 98 104 87 90  Temp: 98.1 F (36.7 C) 97.7 F (36.5 C) 98.5 F (36.9 C)   TempSrc: Oral Oral Oral   Resp: _0 Height:      Weight:      SpO2: 95% 99% 97%    Weight change:   Intake/Output Summary (Last 24 hours) at 08/16/15 1146 Last data filed at 08/16/15 0607  Gross per 24 hour  Intake 1351.66 ml  Output      0 ml  Net 1351.66 ml   General: Vital signs reviewed. Patient in no acute distress Cardiovascular: regular rate, rhythm, no murmur appreciated  Pulmonary/Chest: Clear to auscultation bilaterally, no wheezes, rales, or rhonchi. Abdominal: Soft, non-tender, non-distended, BS + Extremities: No lower extremity edema bilaterally, pulses symmetric and intact bilaterally. Skin: Warm, dry and intact. No rashes or erythema.  Lab Results: Results for orders placed or performed during the hospital encounter of 08/14/15 (from the past 24 hour(s))  Glucose, capillary     Status: Abnormal   Collection Time: 08/15/15 11:59 AM  Result Value Ref Range   Glucose-Capillary 112 (H) 65 - 99 mg/dL  Glucose, capillary     Status: Abnormal   Collection Time: 08/15/15  5:14 PM  Result Value Ref Range   Glucose-Capillary 203 (H) 65 - 99 mg/dL  Glucose, capillary     Status: Abnormal   Collection Time: 08/15/15 10:18 PM  Result Value Ref Range   Glucose-Capillary 137 (H) 65 - 99 mg/dL   Comment 1 Notify RN    Comment 2 Document in Chart   CBC     Status: Abnormal   Collection Time: 08/16/15  5:20 AM  Result Value Ref Range   WBC 1.6 (L) 4.0 - 10.5 K/uL   RBC 4.07 (L) 4.22 - 5.81 MIL/uL   Hemoglobin 11.6 (L)  13.0 - 17.0 g/dL   HCT 33.8 (L) 39.0 - 52.0 %   MCV 83.0 78.0 - 100.0 fL   MCH 28.5 26.0 - 34.0 pg   MCHC 34.3 30.0 - 36.0 g/dL   RDW 12.8 11.5 - 15.5 %   Platelets 54 (L) 150 - 400 K/uL  Comprehensive metabolic panel     Status: Abnormal   Collection Time: 08/16/15  5:20 AM  Result Value Ref Range   Sodium 133 (L) 135 - 145 mmol/L   Potassium 3.9 3.5 - 5.1 mmol/L   Chloride 103 101 - 111 mmol/L   CO2 25 22 - 32 mmol/L   Glucose, Bld 124 (H) 65 - 99 mg/dL   BUN 6 6 - 20 mg/dL   Creatinine, Ser 0.82 0.61 - 1.24 mg/dL   Calcium 7.8 (L) 8.9 - 10.3 mg/dL   Total Protein 5.3 (L) 6.5 - 8.1 g/dL   Albumin 2.7 (L) 3.5 - 5.0 g/dL   AST 61 (H) 15 - 41 U/L   ALT 62 17 - 63 U/L   Alkaline Phosphatase 30 (L) 38 - 126 U/L   Total Bilirubin 0.7 0.3 - 1.2 mg/dL   GFR calc non Af Amer >60 >60 mL/min   GFR calc Af  Amer >60 >60 mL/min   Anion gap 5 5 - 15  Glucose, capillary     Status: Abnormal   Collection Time: 08/16/15  7:42 AM  Result Value Ref Range   Glucose-Capillary 121 (H) 65 - 99 mg/dL     Micro Results: Recent Results (from the past 240 hour(s))  Blood culture (routine x 2)     Status: None (Preliminary result)   Collection Time: 08/14/15  3:10 PM  Result Value Ref Range Status   Specimen Description BLOOD RIGHT ARM  Final   Special Requests   Final    BOTTLES DRAWN AEROBIC AND ANAEROBIC BLUE 10CC RED 5CC   Culture NO GROWTH < 24 HOURS  Final   Report Status PENDING  Incomplete  Blood culture (routine x 2)     Status: None (Preliminary result)   Collection Time: 08/14/15  3:20 PM  Result Value Ref Range Status   Specimen Description BLOOD LEFT ARM  Final   Special Requests BOTTLES DRAWN AEROBIC AND ANAEROBIC 5CC  Final   Culture NO GROWTH < 24 HOURS  Final   Report Status PENDING  Incomplete   Studies/Results: Dg Chest 2 View  08/14/2015  CLINICAL DATA:  Vomiting, loss of appetite, lethargy EXAM: CHEST  2 VIEW COMPARISON:  None. FINDINGS: Lungs are clear.  No pleural  effusion or pneumothorax. The heart is normal in size. Visualized osseous structures are within normal limits. IMPRESSION: Normal chest radiographs. Electronically Signed   By: Julian Hy M.D.   On: 08/14/2015 16:15   Ct Abdomen Pelvis W Contrast  08/15/2015  CLINICAL DATA:  Nausea and vomiting for 5 days. Loss of appetite and dizziness. Elevated LFTs. EXAM: CT ABDOMEN AND PELVIS WITH CONTRAST TECHNIQUE: Multidetector CT imaging of the abdomen and pelvis was performed using the standard protocol following bolus administration of intravenous contrast. CONTRAST:  154m OMNIPAQUE IOHEXOL 300 MG/ML  SOLN COMPARISON:  None. FINDINGS: Lower chest: The included lung bases are clear. There is elevation of right hemidiaphragm. Liver: Prominence size with diffusely decreased density. No focal lesion. Hepatobiliary: Gallbladder physiologically distended. No calcified gallstone. No biliary dilatation. Pancreas: Normal. Spleen: Enlarged maximal dimension of 13.4 cm, volume of 810 mL. No focal lesion. Adrenal glands: No nodule. Kidneys: Symmetric renal enhancement. No hydronephrosis. Hypodense lesion in the upper left kidney measures 2.1 cm, Hounsfield units of 21 on portal venous phase imaging, and 32 on delayed phase. There is a simple cyst in the interpolar left kidney. Small hypodensities in the right renal cortex, too small to characterize. Mild nonspecific perinephric stranding about both kidneys. Stomach/Bowel: Stomach physiologically distended. There are no dilated or thickened small bowel loops. There are large bowel containing bilateral inguinal hernias. On the right this contains nonobstructed normal appearing loops of small bowel. On the left this contains nonobstructing normal appearing loops of sigmoid colon. Minimal diverticulosis of the descending colon without diverticulitis. The appendix is not definitively seen. Vascular/Lymphatic: No retroperitoneal adenopathy. Abdominal aorta is normal in caliber.  Reproductive: Prostate gland normal in size. Bladder: Physiologically distended. Mild wall thickening involving the dome, likely related to herniation and bilateral inguinal hernias. Other: No free air, free fluid, or intra-abdominal fluid collection. Musculoskeletal: There are no acute or suspicious osseous abnormalities. IMPRESSION: 1. No definite acute abnormality in the abdomen/pelvis. 2. Large bilateral inguinal hernias containing normal appearing loops of bowel. 3. Decreased hepatic density suggestive steatosis.  Splenomegaly. 4. Hypodense lesion in the mid left kidney measures 2.1 cm, with questionable delayed enhancement. Nonemergent characterization with  renal protocol MRI recommended. Electronically Signed   By: Jeb Levering M.D.   On: 08/15/2015 01:11   Medications: I have reviewed the patient's current medications. Scheduled Meds: . insulin aspart  0-5 Units Subcutaneous QHS  . insulin aspart  0-9 Units Subcutaneous TID WC  . metoprolol succinate  12.5 mg Oral Daily  . simvastatin  20 mg Oral q1800   Continuous Infusions: . sodium chloride 100 mL/hr at 08/16/15 1043   PRN Meds:.acetaminophen **OR** acetaminophen, iohexol, ondansetron **OR** ondansetron (ZOFRAN) IV Assessment/Plan: Active Problems:   Hyponatremia   Gastroenteritis  49 yo man with 5 day history of nausea and vomiting and malaise, found to have thrombocytopenia and elevated liver enzymes, and hyponatremia.  Thrombocytopenia with elevated liver enzymes presenting with nausea and vomiting: The acute presentation of the patient's illness is concerning for malignancy, tickborne illnesses, particularly ehrlichia, anaplasma and rocky mountain spotted fever, also CMV, EBV and other rheumatologic diseases. However, the patient has already received a week of doxycycline since 10/12 and ended last Thursday, so this is the confounding factor. Patient also denies any history of tick bites and denies any new medications apart  from the antibiotics. He also denies any bleeding.  So far:  LDH elevated at 335, Hepatitis panel negative for A,B,C. Yesterday . diff shows absolute neutrophil of 900 and it shows atypical lymphocytes. TSH normal. INR normal. AM cortiol normal. ESR and CRP normal. Monospot test negative. CT abdomen was unrevealing, except splenomegaly and steatosis.   -on IV fluids and clear liquid diet--> will advance to regular in late afternoon if he tolerates clear liquid -zofran PRN  -Platelets 54--> 51 today- stable. -WBC 1800 with ANC of 900--> WBC 900 today   Pending: -ordered ANA, ANCA, ESR,CRP, - ordered Ehrlichia antibody, RMSF IgM -ordered CMV, EBV   Moderate Hypotonic Hypovolemic Hyponatremia: Improved with IV fluids. Patient with 5 day history of nausea and vomiting. Hyponatremia most likely due to extra renal losses, likely GI. AM cortisol normal, and TSH normal. -Serum osm 273 and urine osm 401, UNa 11, UCr 55.1 ,  Na 125-->133   -continue on NS at 100 ml/hr  Hypokalemia: resolved after 40 meq k yest  K 3.4--> 3.9   Dispo: Disposition is deferred at this time, awaiting improvement of current medical problems.  Anticipated discharge in approximately 1 day(s).   The patient does have a current PCP Oval Linsey, MD) and does need an [facility] hospital follow-up appointment after discharge.  The patient does not have transportation limitations that hinder transportation to clinic appointments.  .Services Needed at time of discharge: Y = Yes, Blank = No PT:   OT:   RN:   Equipment:   Other:     LOS: 2 days   Burgess Estelle, MD 08/16/2015, 11:46 AM

## 2015-08-16 NOTE — Discharge Summary (Signed)
Name: Larry Wiley MRN: [id number] DOB: 30-Nov-1965 49 y.o. PCP: Oval Linsey, MD  Date of Admission: 08/14/2015  1:58 PM Date of Discharge: 08/16/2015 Attending Physician: No att. providers found  Discharge Diagnosis:  Principal Problem:   Emesis Active Problems:   Type 2 diabetes mellitus with proteinuria or albuminuria   Hyperlipidemia   Mental retardation   Essential hypertension   Gastroesophageal reflux disease   Hyponatremia   Gastroenteritis   Elevated LFTs   Thrombocytopenia (HCC)  Discharge Medications:   Medication List    STOP taking these medications        acetaminophen 500 MG tablet  Commonly known as:  TYLENOL     aspirin 81 MG EC tablet     cephALEXin 500 MG capsule  Commonly known as:  KEFLEX     glipiZIDE 5 MG 24 hr tablet  Commonly known as:  GLUCOTROL XL     hydrochlorothiazide 12.5 MG capsule  Commonly known as:  MICROZIDE     simvastatin 20 MG tablet  Commonly known as:  ZOCOR      TAKE these medications        latanoprost 0.005 % ophthalmic solution  Commonly known as:  XALATAN  Place 1 drop into both eyes at bedtime.     metFORMIN 1000 MG tablet  Commonly known as:  GLUCOPHAGE  Take 1 tablet (1,000 mg total) by mouth 2 (two) times daily with a meal.     metoprolol succinate 25 MG 24 hr tablet  Commonly known as:  TOPROL-XL  Take 0.5 tablets (12.5 mg total) by mouth daily.     omeprazole 20 MG capsule  Commonly known as:  PRILOSEC  Take 20 mg by mouth daily.     ondansetron 4 MG tablet  Commonly known as:  ZOFRAN  Take 4 mg by mouth every 8 (eight) hours as needed for nausea or vomiting.        Disposition and follow-up:   Larry Wiley was discharged from [facility] in Good condition.  At the hospital follow up visit please address:  1.  Nausea and Vomiting: Please address recurrence.  Thrombocytopenia: Please check CBC and assess for bleeding.  Elevated LFTs: Please check  CMET.  Leukopenia: Please check CBC with diff to assess for neutropenia.  2.  Labs / imaging needed at time of follow-up: CBC with differential, CMET  3.  Pending labs/ test needing follow-up: Complement, Anaplasmosis, RMSF, Ehrlichia, ANA, ANCA, EBV, CMV   Follow-up Appointments: Follow-up Information    Follow up with Osa Craver, MD On 08/30/2015.   Specialty:  Internal Medicine   Why:  10:45 am   Contact information:   [place] [address] [phone]      Discharge Instructions: Discharge Instructions    Diet - low sodium heart healthy    Complete by:  As directed      Increase activity slowly    Complete by:  As directed           Consultations:  None  Procedures Performed:  Dg Chest 2 View  08/14/2015  CLINICAL DATA:  Vomiting, loss of appetite, lethargy EXAM: CHEST  2 VIEW COMPARISON:  None. FINDINGS: Lungs are clear.  No pleural effusion or pneumothorax. The heart is normal in size. Visualized osseous structures are within normal limits. IMPRESSION: Normal chest radiographs. Electronically Signed   By: Julian Hy M.D.   On: 08/14/2015 16:15   Ct Abdomen Pelvis W Contrast  08/15/2015  CLINICAL DATA:  Nausea and vomiting for 5 days. Loss of appetite and dizziness. Elevated LFTs. EXAM: CT ABDOMEN AND PELVIS WITH CONTRAST TECHNIQUE: Multidetector CT imaging of the abdomen and pelvis was performed using the standard protocol following bolus administration of intravenous contrast. CONTRAST:  154m OMNIPAQUE IOHEXOL 300 MG/ML  SOLN COMPARISON:  None. FINDINGS: Lower chest: The included lung bases are clear. There is elevation of right hemidiaphragm. Liver: Prominence size with diffusely decreased density. No focal lesion. Hepatobiliary: Gallbladder physiologically distended. No calcified gallstone. No biliary dilatation. Pancreas: Normal. Spleen: Enlarged maximal dimension of 13.4 cm, volume of 810 mL. No focal lesion. Adrenal glands: No nodule.  Kidneys: Symmetric renal enhancement. No hydronephrosis. Hypodense lesion in the upper left kidney measures 2.1 cm, Hounsfield units of 21 on portal venous phase imaging, and 32 on delayed phase. There is a simple cyst in the interpolar left kidney. Small hypodensities in the right renal cortex, too small to characterize. Mild nonspecific perinephric stranding about both kidneys. Stomach/Bowel: Stomach physiologically distended. There are no dilated or thickened small bowel loops. There are large bowel containing bilateral inguinal hernias. On the right this contains nonobstructed normal appearing loops of small bowel. On the left this contains nonobstructing normal appearing loops of sigmoid colon. Minimal diverticulosis of the descending colon without diverticulitis. The appendix is not definitively seen. Vascular/Lymphatic: No retroperitoneal adenopathy. Abdominal aorta is normal in caliber. Reproductive: Prostate gland normal in size. Bladder: Physiologically distended. Mild wall thickening involving the dome, likely related to herniation and bilateral inguinal hernias. Other: No free air, free fluid, or intra-abdominal fluid collection. Musculoskeletal: There are no acute or suspicious osseous abnormalities. IMPRESSION: 1. No definite acute abnormality in the abdomen/pelvis. 2. Large bilateral inguinal hernias containing normal appearing loops of bowel. 3. Decreased hepatic density suggestive steatosis.  Splenomegaly. 4. Hypodense lesion in the mid left kidney measures 2.1 cm, with questionable delayed enhancement. Nonemergent characterization with renal protocol MRI recommended. Electronically Signed   By: MJeb LeveringM.D.   On: 08/15/2015 01:11    2D Echo: None  Cardiac Cath: None  Admission HPI: 49yo patient with mild-moderate autism, NIDDM, HLD, HTN, who presented with nausea and vomiting for 5 days. Sister provided most of the history and said Monday night he was listless. His last meal was  Monday night when he ate black eyed beans and chicken. After that, he has had n/v ever day. Friday night, he started vomiting all night every hour until 5 AM and reported it was clear, and no blood. Pt was very lightheaded and had a headache. He denied any diarrhea or constipation.   He had a stye and was given doxycycline on 10/12 by Dr GMarliss Czar [place] doxy[place]ine was changed to Keflex and he was given PPI and zofran.  He reported no travel history, no tick bites, Occasional cough noted at night. Has felt warm. No recorded fever  [facility] Denies any bleeding, bruising or petechiae, or melena or hematochezia.   FH of T2Dm and HTn SH: no alcohol, no smoking, no drinking.  Meds: Patient is on simvastatin 20 mg daily, glipizide 5 mg daily, HCTZ 12.5 mg daily, metformin 1000 mg BID, and metoprolol 12.5 mg daily.   Hospital Course by problem list: Principal Problem:   Emesis Active Problems:   Type 2 diabetes mellitus with proteinuria or albuminuria   Hyperlipidemia   Mental retardation   Essential  hypertension   Gastroesophageal reflux disease   Hyponatremia   Gastroenteritis   Elevated LFTs   Thrombocytopenia (HCC)   Viral Illness versus Tickborne Disease: Patient presented with 5 days of nausea, vomiting and fatigue and was found to have thrombocytopenia, leukopenia, elevated LFTs and lactic acidosis. The acute presentation of the patient's illness is concerning for malignancy,tickborne illnesses, particularly ehrlichia, anaplasma and [place] spotted fever, also CMV, EBV. Doubt rheumatologic diseases as CRP and ESR were normal. Tickborne illness seems less likely given the patient was already on doxycycline for 5 days when he developed N/V. Other differentials include drug reaction. The following labs were normal: Mono, ESR, CRP, HIV, acute hepatitis panel. CT abdomen was unrevealing, except  splenomegaly and steatosis. Patient improved with supportive therapy of IVF and clear liquids. He was able to tolerate a regular diet the day of discharge without nausea or vomiting. LFTs improving slightly from 100s to 60s, platelets stable at 50-60, WBC 1-2. The following were still pending and need follow up after discharge: ANA, ANCA, Ehrlichia antibody, RMSF IgM, CMV, EBV.  Moderate Hypotonic Hypovolemic Hyponatremia: Secondary to extrarenal losses from vomiting. Improved with IV fluids. AM cortisol, TSH normal. Serum osm 273 and urine osm 401, UNa 11, UCr 55.1.  Hypokalemia: Likely secondary to vomiting and poor po intake. Resolved after Potassium 40 mEq.   Hypertension: Patient remained normotensive while on metoprolol 12.5 mg daily. HCTZ was held on discharge due to recent hypovolemia and hypotension. Family was given instructions to restart HCTZ if blood pressure was running >150/90.  T2DM: HgbA1c in the 7's. Patient's home metformin and glipizide were held during admission and held on discharge while patient's symptoms improve. Metformin was held due to lactic acidosis and glipizide was held as this can cause thrombocytopenia, leukopenia, hepatitis, hyponatremia, nausea and vomiting (all the patient's symptoms). Patient's family is to call the clinic if his blood sugar is running in the upper 200s-300s off of the medications. Otherwise, they can be restarted at follow up appointment in 2 weeks.   HLD: Statin was held on discharge due to recent elevated liver enzymes, can be restarted as illness improves.   Discharge Vitals:   BP 129/85 mmHg  Pulse 92  Temp(Src) 98.3 F (36.8 C) (Oral)  Resp 16  Ht '5\' 2"'  (1.575 m)  Wt 139 lb 4.8 oz (63.186 kg)  BMI 25.47 kg/m2  SpO2 98%  Discharge Labs:  Results for orders placed or performed during the hospital encounter of 08/14/15 (from the past 24 hour(s))  Glucose, capillary     Status: Abnormal   Collection Time: 08/15/15 10:18 PM  Result  Value Ref Range   Glucose-Capillary 137 (H) 65 - 99 mg/dL   Comment 1 Notify RN    Comment 2 Document in Chart   CBC     Status: Abnormal   Collection Time: 08/16/15  5:20 AM  Result Value Ref Range   WBC 1.6 (L) 4.0 - 10.5 K/uL   RBC 4.07 (L) 4.22 - 5.81 MIL/uL   Hemoglobin 11.6 (L) 13.0 - 17.0 g/dL   HCT 33.8 (L) 39.0 - 52.0 %   MCV 83.0 78.0 - 100.0 fL   MCH 28.5 26.0 - 34.0 pg   MCHC 34.3 30.0 - 36.0 g/dL   RDW 12.8 11.5 - 15.5 %   Platelets 54 (L) 150 - 400 K/uL  Comprehensive metabolic panel     Status: Abnormal   Collection Time: 08/16/15  5:20 AM  Result Value Ref Range  Sodium 133 (L) 135 - 145 mmol/L   Potassium 3.9 3.5 - 5.1 mmol/L   Chloride 103 101 - 111 mmol/L   CO2 25 22 - 32 mmol/L   Glucose, Bld 124 (H) 65 - 99 mg/dL   BUN 6 6 - 20 mg/dL   Creatinine, Ser 0.82 0.61 - 1.24 mg/dL   Calcium 7.8 (L) 8.9 - 10.3 mg/dL   Total Protein 5.3 (L) 6.5 - 8.1 g/dL   Albumin 2.7 (L) 3.5 - 5.0 g/dL   AST 61 (H) 15 - 41 U/L   ALT 62 17 - 63 U/L   Alkaline Phosphatase 30 (L) 38 - 126 U/L   Total Bilirubin 0.7 0.3 - 1.2 mg/dL   GFR calc non Af Amer >60 >60 mL/min   GFR calc Af Amer >60 >60 mL/min   Anion gap 5 5 - 15  Glucose, capillary     Status: Abnormal   Collection Time: 08/16/15  7:42 AM  Result Value Ref Range   Glucose-Capillary 121 (H) 65 - 99 mg/dL  Glucose, capillary     Status: Abnormal   Collection Time: 08/16/15 12:08 PM  Result Value Ref Range   Glucose-Capillary 199 (H) 65 - 99 mg/dL    Signed: Osa Craver, DO PGY-2 Internal Medicine Resident Pager # [phone] 08/16/2015 6:18 PM

## 2015-08-16 NOTE — Care Management Note (Signed)
Case Management Note  Patient Details  Name: Charletta CousinJohn A Cadavid MRN: [id number] Date of Birth: [birth year]  Subjective/Objective:    Date: 08/16/15 Spoke with patient at the bedside along with Tina [phone] (sister).  Introduced self as Sports coachcase manager and explained role in discharge planning and how to be reached.  [place]  with sister.  Expressed no  potential need for no other DME.  Verified patient anticipates to go home with family at time of discharge and will have full-time supervision by family at this time to best of their knowledge. Patient denied needing help with their medication. Patient is driven by  Julian HySister , Leila Sebastian  to MD appointments. Verified patient has PCP Josem KaufmannKlima. Patient is a patient at the internal medicine clinic.    Plan: CM will continue to follow for discharge planning and [facility] resources.                 Action/Plan:   Expected Discharge Date:                  Expected Discharge Plan:  Home w Home Health Services  In-House Referral:     Discharge planning Services  CM Consult  Post Acute Care Choice:    Choice offered to:     DME Arranged:    DME Agency:     HH Arranged:    HH Agency:     Status of Service:  In process, will continue to follow  Medicare Important Message Given:    Date Medicare IM Given:    Medicare IM give by:    Date Additional Medicare IM Given:    Additional Medicare Important Message give by:     If discussed at Long Length of Stay Meetings, dates discussed:    Additional Comments:  Leone Havenaylor, Tylor Courtwright Clinton, RN 08/16/2015, 12:35 PM

## 2015-08-16 NOTE — Progress Notes (Signed)
  Date: 08/16/2015  Patient name: Charletta CousinJohn A Mundorf  Medical record number: [id number]  Date of birth: 12/02/1965   I have personally seen and evaluated Mr. Razzano.  His plan of care was discussed with the house staff. Please see Dr. Bo MerinoSaraiya's note for complete details. I concur with his findings.  Mr. Erick Alleyalton is much improved.  He can likely return home today with close follow up in the clinic for repeat labwork and follow up on titers drawn.    Inez CatalinaEmily B Bryttney Netzer, MD 08/16/2015, 3:08 PM

## 2015-08-16 NOTE — Progress Notes (Signed)
Pt and caregiver given discharge instructions and care notes. Pt and caregiver verbalized understanding AEB no further questions or concerns at this time. IV was discontinued, no redness, pain, or swelling noted at this time. Telemetry discontinued and Centralized Telemetry was notified. Pt left the floor via wheelchair with staff in stable condition.

## 2015-08-16 NOTE — Discharge Instructions (Signed)
STOP TAKING YOUR METFORMIN, GLIPIZIDE, ASPIRIN, HYDROCHLOROTHIAZIDE AND TYLENOL FOR NOW.   IF BLOOD PRESSURE STARTS TO INCREASE ABOVE >150/90 YOU CAN RESTART YOUR HOME HYDROCHLOROTHIAZIDE.  PLEASE FOLLOW UP IN THE CLINIC ON November 7TH.   IF YOU HAVE ANY EVIDENCE OF THE FOLLOWING PLEASE COME TO THE EMERGENCY DEPARTMENT: -BLEEDING IN YOUR STOOL, VOMITING, URINE -SEVERE HEADACHE -UNCONTROLLED VOMITING -DIFFUSE RED RASH -CONFUSION, EXTREME FATIGUE -FEVER -SEVERE ABDOMINAL PAIN

## 2015-08-17 LAB — EHRLICHIA ANTIBODY PANEL
E CHAFFEENSIS AB, IGG: NEGATIVE
E chaffeensis (HGE) Ab, IgM: NEGATIVE
E. CHAFFEENSIS (HME) IGM TITER: NEGATIVE
E. CHAFFEENSIS IGG AB: NEGATIVE

## 2015-08-17 LAB — EPSTEIN BARR VRS(EBV DNA BY PCR)
EBV DNA QN BY PCR: NEGATIVE {copies}/mL
log10 EBV DNA Qn PCR: UNDETERMINED log10copy/mL

## 2015-08-17 LAB — MISC LABCORP TEST (SEND OUT)

## 2015-08-17 LAB — COMPLEMENT, TOTAL: Compl, Total (CH50): >60 U/mL (ref 42–60)

## 2015-08-17 LAB — ANTINUCLEAR ANTIBODIES, IFA: ANTINUCLEAR ANTIBODIES, IFA: NEGATIVE

## 2015-08-18 LAB — ANCA TITERS
C-ANCA: <1:20 {titer}
P-ANCA: <1:20 {titer}

## 2015-08-19 LAB — CULTURE, BLOOD (ROUTINE X 2)
Culture: NO GROWTH
Culture: NO GROWTH

## 2015-08-20 ENCOUNTER — Telehealth: Payer: Self-pay | Admitting: Internal Medicine

## 2015-08-20 NOTE — Telephone Encounter (Signed)
He can restart the Metformin. Will you please let him know?  Thank you! Alexa

## 2015-08-20 NOTE — Telephone Encounter (Signed)
Spoke with patients sister Lela.  Pt was discharged from hospital over the weekend and told to discontinue his metformin and glipizide until follow up apt on 11/7.   Sister reports no more nausea or diarrhea but pt blood sugars have been elevated from the low 200s to low 300s.  CBG [phone] before dinner last night 515pm after eating lunch around 1pm CBG 177 before breakfast this morning Sister just wanting to touch base and make sure they are to continue to hold the metformin and glipizide Please advise

## 2015-08-20 NOTE — Telephone Encounter (Signed)
Spoke with Lela, advised that Dr. Senaida Oresichardson says to restart metformin.  Sister understands.  She will call back with any additional questions and see us on the 7th

## 2015-08-20 NOTE — Telephone Encounter (Signed)
Pt states brother blood sugar is high and want the nurse to call back.

## 2015-08-20 NOTE — Telephone Encounter (Signed)
I also called and spoke to one of his sisters who cares for him.  I advised them to restart both the metformin and the glipizide at the pre-hospital doses.  They can hold the glipizide if his sugar drops below 80, but that typically does not happen with Larry Wiley when he is eating and drinking normally.  At this point, they feel his oral intake is at baseline, hence my comfort with starting both medications.

## 2015-08-30 ENCOUNTER — Ambulatory Visit (INDEPENDENT_AMBULATORY_CARE_PROVIDER_SITE_OTHER): Payer: Medicare Other | Admitting: Internal Medicine

## 2015-08-30 ENCOUNTER — Encounter: Payer: Self-pay | Admitting: Internal Medicine

## 2015-08-30 VITALS — BP 129/76 | HR 80 | Temp 97.8°F | Ht 62.0 in | Wt 136.1 lb

## 2015-08-30 DIAGNOSIS — R809 Proteinuria, unspecified: Secondary | ICD-10-CM

## 2015-08-30 DIAGNOSIS — E119 Type 2 diabetes mellitus without complications: Secondary | ICD-10-CM | POA: Diagnosis not present

## 2015-08-30 DIAGNOSIS — IMO0001 Reserved for inherently not codable concepts without codable children: Secondary | ICD-10-CM

## 2015-08-30 DIAGNOSIS — R945 Abnormal results of liver function studies: Secondary | ICD-10-CM | POA: Diagnosis not present

## 2015-08-30 DIAGNOSIS — Z23 Encounter for immunization: Secondary | ICD-10-CM

## 2015-08-30 DIAGNOSIS — D696 Thrombocytopenia, unspecified: Secondary | ICD-10-CM | POA: Diagnosis not present

## 2015-08-30 DIAGNOSIS — R7989 Other specified abnormal findings of blood chemistry: Secondary | ICD-10-CM | POA: Diagnosis not present

## 2015-08-30 DIAGNOSIS — I10 Essential (primary) hypertension: Secondary | ICD-10-CM | POA: Diagnosis not present

## 2015-08-30 LAB — GLUCOSE, CAPILLARY: Glucose-Capillary: 138 mg/dL — ABNORMAL HIGH (ref 65–99)

## 2015-08-30 LAB — POCT GLYCOSYLATED HEMOGLOBIN (HGB A1C): Hemoglobin A1C: 6.2

## 2015-08-30 MED ORDER — HYDROCHLOROTHIAZIDE 12.5 MG PO CAPS: 12.5000 mg | ORAL_CAPSULE | Freq: Every day | ORAL | Status: DC

## 2015-08-30 MED ORDER — GLIPIZIDE ER 5 MG PO TB24: 5.0000 mg | ORAL_TABLET | Freq: Every day | ORAL | Status: DC

## 2015-08-30 NOTE — Assessment & Plan Note (Signed)
BP Readings from Last 3 Encounters:  08/30/15 129/76  08/16/15 129/85  04/16/15 118/75    Lab Results  Component Value Date   NA 133* 08/16/2015   K 3.9 08/16/2015   CREATININE 0.82 08/16/2015    Assessment: Blood pressure control:  Well controlled Progress toward BP goal:   Improved Comments: Compliant with HCTZ 12.5 mg daily and metoprolol 12.5 mg daily.   Plan: Medications:  continue current medications

## 2015-08-30 NOTE — Progress Notes (Signed)
Subjective:    Patient ID: Larry Wiley, male    DOB: April 01, 1966, 49 y.o.   MRN: [id number]  HPI Larry Wiley is a 49 y.o. male with PMHx of HTN, T2DM who presents to the clinic for follow up after being admitted to the hospital for nausea, vomiting, thrombocytopenia and elevated LFTs. Please see A&P for the status of the patient's chronic medical problems.   Patient was admitted to the hospital in October 2016 after complaint of persistent nausea and vomiting and being found to have thrombocytopenia and elevated LFTs. Nausea and vomiting resolved during his stay and thrombocytopenia remained stable and elevated LFTs improved. He improved with conservative management. Etiology was thought to be secondary to viral illness or tick-borne illness; however, RMSF, Erlichia, ANA, ANCA, EBV, Mono, HIV, ESR, CRP, complement were all normal. Today, he feels well and has no complaints.    Past Medical History  Diagnosis Date  . Type 2 diabetes mellitus with proteinuria or microalbuminuria 08/18/2006  . Essential hypertension 08/18/2006  . Hyperlipidemia LDL goal < 100 05/03/2007  . Gastroesophageal reflux disease 09/27/2012    Intermittent symptoms, does not want therapy   . Open-angle glaucoma 07/25/2013  . Overweight (BMI 25.0-29.9) 09/27/2012  . Mental retardation 08/18/2006  . Bilateral inguinal hernia     Outpatient Encounter Prescriptions as of 08/30/2015  Medication Sig Note  . glipiZIDE (GLUCOTROL XL) 5 MG 24 hr tablet Take 1 tablet (5 mg total) by mouth daily with breakfast.   . latanoprost (XALATAN) 0.005 % ophthalmic solution Place 1 drop into both eyes at bedtime.   . metFORMIN (GLUCOPHAGE) 1000 MG tablet Take 1 tablet (1,000 mg total) by mouth 2 (two) times daily with a meal.   . metoprolol succinate (TOPROL-XL) 25 MG 24 hr tablet Take 0.5 tablets (12.5 mg total) by mouth daily.   . ondansetron (ZOFRAN) 4 MG tablet Take 4 mg by mouth every 8 (eight) hours as needed for nausea or  vomiting.   . [DISCONTINUED] omeprazole (PRILOSEC) 20 MG capsule Take 20 mg by mouth daily. 08/14/2015: New medication filled 08/13/15 - not yet taken   No facility-administered encounter medications on file as of 08/30/2015.    Family History  Problem Relation Age of Onset  . Hyperlipidemia Mother   . Hypertension Mother   . Liver cancer Mother   . Diabetes Father   . Heart attack Father     Social History   Social History  . Marital Status: Single    Spouse Name: N/A  . Number of Children: N/A  . Years of Education: N/A   Occupational History  . Not on file.   Social History Main Topics  . Smoking status: Never Smoker   . Smokeless tobacco: Never Used  . Alcohol Use: No  . Drug Use: No  . Sexual Activity: No   Other Topics Concern  . Not on file   Social History Narrative   Lives with sisters who help him with meals, medications, etc.  Has a dog named Marcille Blanco (male shepard mix born in 2003) whom he walks around the neighborhood.  Also mows lawn.    Review of Systems General: Denies fever, chills, fatigue, change in appetite and diaphoresis.  Respiratory: Denies SOB, cough Cardiovascular: Denies chest pain and palpitations.  Gastrointestinal: Denies nausea, vomiting, abdominal pain, diarrhea, constipation, blood in stool Musculoskeletal: Denies myalgias, arthralgias  Skin: Denies pallor, rash and wounds.  Neurological: Denies dizziness, headaches, weakness, lightheadedness    Objective:  Physical Exam Filed Vitals:   08/30/15 1056  BP: 129/76  Pulse: 80  Temp: 97.8 F (36.6 C)  TempSrc: Oral  Height: '5\' 2"'  (1.575 m)  Weight: 136 lb 1.6 oz (61.735 kg)  SpO2: 100%   General: Vital signs reviewed.  Patient is well-developed and well-nourished, in no acute distress and cooperative with exam.  Eyes: b/l styes  Cardiovascular: RRR, S1 normal, S2 normal Pulmonary/Chest: Clear to auscultation bilaterally, no wheezes, rales, or rhonchi. Abdominal: Soft,  non-tender, non-distended, BS +  Extremities: No lower extremity edema bilaterally, pulses symmetric and intact bilaterally. Skin: Warm, dry and intact. No rashes or erythema. Psychiatric: Normal mood and affect. speech and behavior is normal.      Assessment & Plan:   Please see problem based assessment and plan.

## 2015-08-30 NOTE — Assessment & Plan Note (Signed)
Lab Results  Component Value Date   HGBA1C 6.2 08/30/2015   HGBA1C 7.2 04/16/2015   HGBA1C 7.3 12/25/2014     Assessment: Diabetes control:  Controlled Progress toward A1C goal:   Improved Comments: Compliant with Metformin 1000 mg BID and glipizide 5 mg daily.   Plan: Medications:  continue current medications Home glucose monitoring: Frequency:  BID Timing:  AC/HS Instruction/counseling given: discussed diet  Other plans: No hypoglycemic episodes, continue current regimen.

## 2015-08-30 NOTE — Assessment & Plan Note (Addendum)
Patient was admitted to the hospital in October 2016 after complaint of persistent nausea and vomiting and being found to have thrombocytopenia and elevated LFTs. Nausea and vomiting resolved during his stay and thrombocytopenia remained stable and elevated LFTs improved. He improved with conservative management. Etiology was thought to be secondary to viral illness or tick-borne illness; however, RMSF, Erlichia, ANA, ANCA, EBV, Mono, HIV, ESR, CRP, complement were all normal. Today, he feels well and has no complaints.   Plan: -Recheck CBC  Addendum: -Leukopenia and thrombocytopenia have resolved

## 2015-08-30 NOTE — Assessment & Plan Note (Addendum)
Patient was admitted to the hospital in October 2016 after complaint of persistent nausea and vomiting and being found to have thrombocytopenia and elevated LFTs. Nausea and vomiting resolved during his stay and thrombocytopenia remained stable and elevated LFTs improved. He improved with conservative management. Etiology was thought to be secondary to viral illness or tick-borne illness; however, RMSF, Erlichia, ANA, ANCA, EBV, Mono, HIV, ESR, CRP, complement were all normal. Today, he feels well and has no complaints.   Plan: -Recheck CMET  Addendum: -AST 45, ALT 95 mildly elevated. These have been elevated in the past. CT Abdomen/Pelvis in October 2016 showed hepatic steatosis.  

## 2015-08-30 NOTE — Patient Instructions (Signed)
CONTINUE ALL MEDICATIONS THE SAME (YOU CAN STOP TAKING OMEPRAZOLE).  WE ARE RECHECKING LABS TODAY. I WILL GIVE YOU A CALL IF ANYTHING IS ABNORMAL.

## 2015-08-31 LAB — CMP14 + ANION GAP
A/G RATIO: 1.8 (ref 1.1–2.5)
ALBUMIN: 4.5 g/dL (ref 3.5–5.5)
ALT: 95 IU/L — ABNORMAL HIGH (ref 0–44)
ANION GAP: 16 mmol/L (ref 10.0–18.0)
AST: 45 IU/L — ABNORMAL HIGH (ref 0–40)
Alkaline Phosphatase: 46 IU/L (ref 39–117)
BILIRUBIN TOTAL: 0.6 mg/dL (ref 0.0–1.2)
BUN / CREAT RATIO: 13 (ref 9–20)
BUN: 10 mg/dL (ref 6–24)
CHLORIDE: 98 mmol/L (ref 97–106)
CO2: 26 mmol/L (ref 18–29)
Calcium: 10.2 mg/dL (ref 8.7–10.2)
Creatinine, Ser: 0.77 mg/dL (ref 0.76–1.27)
GFR calc Af Amer: 121 mL/min/{1.73_m2} (ref >59)
GFR calc non Af Amer: 105 mL/min/{1.73_m2} (ref >59)
GLUCOSE: 147 mg/dL — AB (ref 65–99)
Globulin, Total: 2.5 g/dL (ref 1.5–4.5)
POTASSIUM: 5.1 mmol/L (ref 3.5–5.2)
SODIUM: 140 mmol/L (ref 136–144)
TOTAL PROTEIN: 7 g/dL (ref 6.0–8.5)

## 2015-08-31 LAB — CBC WITH DIFFERENTIAL/PLATELET
BASOS ABS: 0 10*3/uL (ref 0.0–0.2)
Basos: 1 %
EOS (ABSOLUTE): 0.1 10*3/uL (ref 0.0–0.4)
Eos: 1 %
HEMOGLOBIN: 14 g/dL (ref 12.6–17.7)
Hematocrit: 42.7 % (ref 37.5–51.0)
Immature Grans (Abs): 0 10*3/uL (ref 0.0–0.1)
Immature Granulocytes: 0 %
LYMPHS ABS: 1.5 10*3/uL (ref 0.7–3.1)
Lymphs: 30 %
MCH: 28.4 pg (ref 26.6–33.0)
MCHC: 32.8 g/dL (ref 31.5–35.7)
MCV: 87 fL (ref 79–97)
MONOCYTES: 9 %
Monocytes Absolute: 0.4 10*3/uL (ref 0.1–0.9)
NEUTROS ABS: 3.1 10*3/uL (ref 1.4–7.0)
Neutrophils: 59 %
Platelets: 177 10*3/uL (ref 150–379)
RBC: 4.93 x10E6/uL (ref 4.14–5.80)
RDW: 14 % (ref 12.3–15.4)
WBC: 5.1 10*3/uL (ref 3.4–10.8)

## 2015-09-01 NOTE — Progress Notes (Signed)
Internal Medicine Clinic Attending  Case discussed with Dr. Richardson soon after the resident saw the patient.  We reviewed the resident's history and exam and pertinent patient test results.  I agree with the assessment, diagnosis, and plan of care documented in the resident's note. 

## 2015-09-03 DIAGNOSIS — H00025 Hordeolum internum left lower eyelid: Secondary | ICD-10-CM | POA: Diagnosis not present

## 2015-09-03 DIAGNOSIS — H2513 Age-related nuclear cataract, bilateral: Secondary | ICD-10-CM | POA: Diagnosis not present

## 2015-09-03 DIAGNOSIS — E119 Type 2 diabetes mellitus without complications: Secondary | ICD-10-CM | POA: Diagnosis not present

## 2015-09-03 DIAGNOSIS — H5500 Unspecified nystagmus: Secondary | ICD-10-CM | POA: Diagnosis not present

## 2015-09-03 DIAGNOSIS — H00021 Hordeolum internum right upper eyelid: Secondary | ICD-10-CM | POA: Diagnosis not present

## 2015-09-03 LAB — HM DIABETES EYE EXAM

## 2015-09-15 DIAGNOSIS — H00021 Hordeolum internum right upper eyelid: Secondary | ICD-10-CM | POA: Diagnosis not present

## 2015-09-23 ENCOUNTER — Encounter: Payer: Self-pay | Admitting: *Deleted

## 2015-10-15 DIAGNOSIS — H00024 Hordeolum internum left upper eyelid: Secondary | ICD-10-CM | POA: Diagnosis not present

## 2015-10-15 DIAGNOSIS — H00025 Hordeolum internum left lower eyelid: Secondary | ICD-10-CM | POA: Diagnosis not present

## 2015-10-27 ENCOUNTER — Other Ambulatory Visit: Payer: Self-pay | Admitting: Internal Medicine

## 2015-10-27 DIAGNOSIS — IMO0001 Reserved for inherently not codable concepts without codable children: Secondary | ICD-10-CM

## 2016-02-11 ENCOUNTER — Other Ambulatory Visit: Payer: Self-pay | Admitting: Internal Medicine

## 2016-02-11 DIAGNOSIS — I10 Essential (primary) hypertension: Secondary | ICD-10-CM

## 2016-02-20 ENCOUNTER — Other Ambulatory Visit: Payer: Self-pay | Admitting: Internal Medicine

## 2016-02-20 DIAGNOSIS — IMO0001 Reserved for inherently not codable concepts without codable children: Secondary | ICD-10-CM

## 2016-02-21 NOTE — Telephone Encounter (Signed)
Has appointment 02/25/2016.

## 2016-02-25 ENCOUNTER — Ambulatory Visit (INDEPENDENT_AMBULATORY_CARE_PROVIDER_SITE_OTHER): Payer: Medicare Other | Admitting: Internal Medicine

## 2016-02-25 ENCOUNTER — Encounter: Payer: Self-pay | Admitting: Internal Medicine

## 2016-02-25 VITALS — BP 128/84 | HR 91 | Temp 98.0°F | Wt 145.2 lb

## 2016-02-25 DIAGNOSIS — E1129 Type 2 diabetes mellitus with other diabetic kidney complication: Secondary | ICD-10-CM

## 2016-02-25 DIAGNOSIS — Z7984 Long term (current) use of oral hypoglycemic drugs: Secondary | ICD-10-CM | POA: Diagnosis not present

## 2016-02-25 DIAGNOSIS — L718 Other rosacea: Secondary | ICD-10-CM | POA: Diagnosis not present

## 2016-02-25 DIAGNOSIS — E785 Hyperlipidemia, unspecified: Secondary | ICD-10-CM | POA: Diagnosis not present

## 2016-02-25 DIAGNOSIS — E1165 Type 2 diabetes mellitus with hyperglycemia: Secondary | ICD-10-CM

## 2016-02-25 DIAGNOSIS — I10 Essential (primary) hypertension: Secondary | ICD-10-CM

## 2016-02-25 DIAGNOSIS — H4010X0 Unspecified open-angle glaucoma, stage unspecified: Secondary | ICD-10-CM

## 2016-02-25 DIAGNOSIS — R809 Proteinuria, unspecified: Secondary | ICD-10-CM

## 2016-02-25 DIAGNOSIS — IMO0001 Reserved for inherently not codable concepts without codable children: Secondary | ICD-10-CM

## 2016-02-25 DIAGNOSIS — H4010X1 Unspecified open-angle glaucoma, mild stage: Secondary | ICD-10-CM

## 2016-02-25 DIAGNOSIS — E119 Type 2 diabetes mellitus without complications: Secondary | ICD-10-CM | POA: Diagnosis not present

## 2016-02-25 HISTORY — DX: Other rosacea: L71.8

## 2016-02-25 LAB — POCT GLYCOSYLATED HEMOGLOBIN (HGB A1C): HEMOGLOBIN A1C: 7.8

## 2016-02-25 LAB — GLUCOSE, CAPILLARY: GLUCOSE-CAPILLARY: 204 mg/dL — AB (ref 65–99)

## 2016-02-25 MED ORDER — LATANOPROST 0.005 % OP SOLN: 1.0000 [drp] | Freq: Every day | OPHTHALMIC | Status: DC

## 2016-02-25 MED ORDER — GLIPIZIDE ER 5 MG PO TB24: 5.0000 mg | ORAL_TABLET | Freq: Two times a day (BID) | ORAL | Status: DC

## 2016-02-25 MED ORDER — METRONIDAZOLE 0.75 % EX CREA: TOPICAL_CREAM | Freq: Every day | CUTANEOUS | Status: DC

## 2016-02-25 MED ORDER — ATORVASTATIN CALCIUM 20 MG PO TABS: 20.0000 mg | ORAL_TABLET | Freq: Every day | ORAL | Status: DC

## 2016-02-25 NOTE — Assessment & Plan Note (Signed)
Assessment  His simvastatin was apparently discontinued during his hospitalization for his viral gastroenteritis when his transaminases were noted to be elevated.  Plan  Given his diabetes we will start a high intensity statin at this time.  He was prescribed atorvastatin 20 mg daily.  We will assess his tolerance of this medication at the follow-up visit.

## 2016-02-25 NOTE — Assessment & Plan Note (Signed)
Assessment  He is followed closely by Dr. Dione BoozeGroat for his bilateral open-angle glaucoma and is tolerating the lantanoprost 0.005% 1 drop in each eye nightly well  Plan  We will continue the lantanoprost drops with follow-up with Dr. Dione BoozeGroat.

## 2016-02-25 NOTE — Assessment & Plan Note (Signed)
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  Plan  We will start metronidazole cream 0.75% daily for 2 months and reassess the rash at the follow-up visit.  He was reminded that it can take up to 4 weeks to start seeing improvement and 8 weeks to achieve the peak effect of the cream, so to stick with it even if he does not see immediate improvement.

## 2016-02-25 NOTE — Assessment & Plan Note (Signed)
Assessment  His diabetes is not as well controlled today with the Hgb A1C of 7.8.  This is on metformin 1000 mg BID and glipizide 5 mg daily.  He has been eating green beans and asparagus, but continues to admit a weakness towards mashed potatoes.  His dog Sheryle HailDiamond had to be put to sleep (she was 52 years old) and so he has not had to be as active since he had to walk her before.  His weight is also up 9 pounds and likely is contributing to his slight decline in glycemic control.  Plan  He will try to be more active with the Spring and Summer coming.  He will continue the metformin at 1000 mg twice daily.  We will increase the glipizide to 5 mg twice daily.  We will reassess his glycemic control with a Hgb A1C at the follow-up visit.

## 2016-02-25 NOTE — Progress Notes (Signed)
   Subjective:    Patient ID: Larry Wiley, male    DOB: 02-Apr-1966, 50 y.o.   MRN: [id number]  HPI  Larry CousinJohn A Uhls is here for follow-up of his diabetes, hypertension, and overweight status. Please see the A&P for the status of the pt's chronic medical problems.  His only new complaint is an acne like rash on his face that began about 1 month ago.  It was papular and erythematous over the malar aspect of his face that involved the nasolabial folds and in between his eyebrows.  Initially he tried Clerasil w/o relief of his symptoms.  He then tried [employer] soap, again w/o improvement in his rash.  There had been no prior changes to soaps or detergents.  He is out in the sun often but never burned.  He denies joint aches or flushing with coffee.  There has been no pain to this rash and he has not other skin lesions.  Review of Systems  Constitutional: Negative for activity change, appetite change and unexpected weight change.  Eyes: Negative for redness and itching.  Respiratory: Negative for shortness of breath.   Cardiovascular: Negative for chest pain, palpitations and leg swelling.  Gastrointestinal: Negative for nausea, vomiting, abdominal pain, diarrhea, constipation and abdominal distention.  Musculoskeletal: Negative for myalgias, back pain, joint swelling, arthralgias, gait problem, neck pain and neck stiffness.  Skin: Positive for color change and rash. Negative for pallor and wound.  Allergic/Immunologic: Negative for environmental allergies, food allergies and immunocompromised state.  Psychiatric/Behavioral: Negative for behavioral problems and agitation.      Objective:   Physical Exam  Constitutional: He appears well-developed and well-nourished. No distress.  HENT:  Head: Normocephalic and atraumatic.  Eyes: Conjunctivae are normal. Right eye exhibits no discharge. Left eye exhibits no discharge. No scleral icterus.  Cardiovascular: Normal rate, regular rhythm and normal heart  sounds.  Exam reveals no gallop and no friction rub.   No murmur heard. Pulmonary/Chest: Effort normal and breath sounds normal. No respiratory distress. He has no wheezes. He has no rales.  Abdominal: Soft. Bowel sounds are normal. He exhibits no distension. There is no tenderness. There is no rebound and no guarding.  Musculoskeletal: Normal range of motion. He exhibits no edema or tenderness.  Neurological: He is alert. He exhibits normal muscle tone.  Skin: Skin is warm and dry. Rash noted. He is not diaphoretic. No erythema. No pallor.  Papulopustular rash over malar aspect of the face also involving the area between the eyebrows.  The nasolabial folds are also involved.  There is no involvement of the chin.  Psychiatric: He has a normal mood and affect. His behavior is normal.  Nursing note and vitals reviewed.     Assessment & Plan:   Please see problem oriented charting.

## 2016-02-25 NOTE — Patient Instructions (Signed)
It is always wonderful to see you.  You definitely make my day!  1) I think your rash is called Papulopustular rosacea.  Please take the metronidazole cream 0.75% to rash each morning.  Take for 2 months (last dose on the 4th of July).  2) Increase your glipizide to 5 mg twice daily.  3) Keep all of your other medications the same.  4) Keep eating the green beans and asparagus and keep being active.  This will help with your weight.  I will see you back in 3 months, sooner if necessary.

## 2016-02-25 NOTE — Assessment & Plan Note (Signed)
He is not interested in colon cancer screening.  A urine for microalbumin was obtained today and is pending at the time of this dictation.  A foot examination will be performed at the follow-up visit.

## 2016-02-25 NOTE — Assessment & Plan Note (Signed)
Assessment  His blood pressure is well controlled today at 128/84.  This is on metoprolol 12.5 mg daily and HCTZ 12.5 mg daily.  Plan  We will continue the Atenolol and HCTZ at 12.5 mg daily and reassess his blood pressure at the follow-up visit.

## 2016-02-26 LAB — MICROALBUMIN / CREATININE URINE RATIO
CREATININE, UR: 86.7 mg/dL
MICROALB/CREAT RATIO: 23.6 mg/g creat (ref 0.0–30.0)
MICROALBUM., U, RANDOM: 20.5 ug/mL

## 2016-03-03 ENCOUNTER — Other Ambulatory Visit: Payer: Self-pay | Admitting: Internal Medicine

## 2016-03-03 DIAGNOSIS — I10 Essential (primary) hypertension: Secondary | ICD-10-CM

## 2016-03-06 NOTE — Progress Notes (Signed)
Urine creatinine 86.7 Urine microalbumin 20.5 Urine microalbumin/Creatinine ratio 23.6  Microalbumin/Creatinine still < 30 despite inability to tolerate ACEI.  Will continue to monitor.

## 2016-03-10 DIAGNOSIS — R05 Cough: Secondary | ICD-10-CM | POA: Diagnosis not present

## 2016-06-28 ENCOUNTER — Telehealth: Payer: Self-pay | Admitting: Internal Medicine

## 2016-06-28 NOTE — Telephone Encounter (Signed)
APT. REMINDER CALL, LMTCB °

## 2016-06-29 ENCOUNTER — Encounter: Payer: Self-pay | Admitting: Internal Medicine

## 2016-06-29 ENCOUNTER — Ambulatory Visit (INDEPENDENT_AMBULATORY_CARE_PROVIDER_SITE_OTHER): Payer: Medicare Other | Admitting: Internal Medicine

## 2016-06-29 VITALS — BP 129/74 | HR 81 | Temp 98.2°F | Wt 143.7 lb

## 2016-06-29 DIAGNOSIS — I10 Essential (primary) hypertension: Secondary | ICD-10-CM | POA: Diagnosis not present

## 2016-06-29 DIAGNOSIS — H4010X0 Unspecified open-angle glaucoma, stage unspecified: Secondary | ICD-10-CM

## 2016-06-29 DIAGNOSIS — E785 Hyperlipidemia, unspecified: Secondary | ICD-10-CM | POA: Diagnosis not present

## 2016-06-29 DIAGNOSIS — R809 Proteinuria, unspecified: Secondary | ICD-10-CM | POA: Diagnosis not present

## 2016-06-29 DIAGNOSIS — Z6826 Body mass index (BMI) 26.0-26.9, adult: Secondary | ICD-10-CM

## 2016-06-29 DIAGNOSIS — Z23 Encounter for immunization: Secondary | ICD-10-CM | POA: Diagnosis not present

## 2016-06-29 DIAGNOSIS — E663 Overweight: Secondary | ICD-10-CM | POA: Diagnosis not present

## 2016-06-29 DIAGNOSIS — Z79899 Other long term (current) drug therapy: Secondary | ICD-10-CM

## 2016-06-29 DIAGNOSIS — E1129 Type 2 diabetes mellitus with other diabetic kidney complication: Secondary | ICD-10-CM

## 2016-06-29 DIAGNOSIS — Z7984 Long term (current) use of oral hypoglycemic drugs: Secondary | ICD-10-CM

## 2016-06-29 DIAGNOSIS — L718 Other rosacea: Secondary | ICD-10-CM

## 2016-06-29 DIAGNOSIS — IMO0001 Reserved for inherently not codable concepts without codable children: Secondary | ICD-10-CM

## 2016-06-29 DIAGNOSIS — E1139 Type 2 diabetes mellitus with other diabetic ophthalmic complication: Secondary | ICD-10-CM

## 2016-06-29 DIAGNOSIS — Z0000 Encounter for general adult medical examination without abnormal findings: Secondary | ICD-10-CM

## 2016-06-29 LAB — GLUCOSE, CAPILLARY: Glucose-Capillary: 149 mg/dL — ABNORMAL HIGH (ref 65–99)

## 2016-06-29 LAB — POCT GLYCOSYLATED HEMOGLOBIN (HGB A1C): Hemoglobin A1C: 8.3

## 2016-06-29 MED ORDER — GLIPIZIDE ER 10 MG PO TB24: 10.0000 mg | ORAL_TABLET | Freq: Two times a day (BID) | ORAL | 3 refills | Status: DC

## 2016-06-29 MED ORDER — ASPIRIN EC 81 MG PO TBEC: 81.0000 mg | DELAYED_RELEASE_TABLET | Freq: Every day | ORAL | 0 refills | Status: DC

## 2016-06-29 MED ORDER — ATORVASTATIN CALCIUM 40 MG PO TABS: 40.0000 mg | ORAL_TABLET | Freq: Every day | ORAL | 3 refills | Status: DC

## 2016-06-29 NOTE — Assessment & Plan Note (Signed)
Assessment  His diabetic control has deteriorated since the last clinic visit. His previous A1c was 7.8 and his glipizide dose was increased. His hemoglobin A1c today is 8.3. This is despite being compliant with his glipizide XL 5 mg by mouth twice daily and metformin 1000 mg by mouth twice daily. He has also lost 2 pounds in the interim. He states he's been active. Unfortunately, it appears he is sneaking cookies and still eating mashed potatoes. He also eats very quickly and therefore does not receive signals from his body that he is full. Review of his meter reveals 6 readings. 16% are within target and 83% are above target. His average glucose is 197. He had no hypoglycemic measurements. At the last visit his microalbumin level was not elevated despite the inability to tolerate ace inhibitors.  Plan  We will increase the glipizide XL to 10 mg by mouth twice daily which is the maximum dose. We will continue the metformin at 1000 mg by mouth twice daily. I encouraged him to decrease the cookies and mashed potatoes and concentrate on other vegetables. His family is continuing to work with him on slowing down when he eats. We will reassess his blood pressure control at the follow-up visit with a repeat hemoglobin A1c.

## 2016-06-29 NOTE — Assessment & Plan Note (Signed)
His family remains uninterested in pursuing screening for colon cancer. He received a flu vaccination today. He is otherwise up-to-date on his health care maintenance.

## 2016-06-29 NOTE — Assessment & Plan Note (Signed)
Assessment  He is tolerating the latanoprost 0.005% solution 1 drop at bedtime well.  Plan  We will continue with this eyedrop and his eye health will be managed by his ophthalmologist.

## 2016-06-29 NOTE — Assessment & Plan Note (Signed)
Assessment  His weight is down 2 pounds from the last visit. He was congratulated on this progress. Given his diet of cookies and mashed potatoes I suspect his hemoglobin A1c would be even higher if his weight had not decreased.  Plan  We discussed the need to try to limit the number of cookies as well as the portion size of his mashed potatoes. He will continue to be active and we will reassess for evidence of weight loss at the follow-up visit.

## 2016-06-29 NOTE — Assessment & Plan Note (Signed)
Assessment  He is tolerating the atorvastatin 20 mg by mouth daily without myalgias. This is moderate intensity statin therapy. With his less than ideally controlled diabetes he is a candidate for high intensity statin therapy.  Plan  We will increase the atorvastatin 40 mg by mouth daily and reassess his tolerance of this increased dose at the follow-up visit.

## 2016-06-29 NOTE — Patient Instructions (Signed)
It was great to see you again!  1) Try to eat less of the potatoes and limit the cookies as best as you can.  2) Stay active so you can keep loosing weight.  3) Try to slow down when eating.  Listen to your family.  4) Restart the metronidazole 0.75% gel.  We may need to keep taking this for a very long time.  5) Increase the atorvastatin to 40 mg daily.  Keep taking what you have, but the new tablets will be a higher dose.  6) Increase the glipizide to 10 mg twice daily.  For now take the 5 mg tablets at 2 tablets twice daily until you run out.  When you get the new tablets they will be 10 mg tablets so you will only need to take 1 tablet twice daily of the new tablets.  7) Keep taking your other medications as you are.  8) We gave you the flu shot today.  I will see you back in 3 months, sooner if necessary.

## 2016-06-29 NOTE — Assessment & Plan Note (Signed)
Assessment  His blood pressure today is 129/74 which is well within target. This is on Toprol XL 12.5 mg by mouth daily and hydrochlorothiazide 12.5 mg by mouth daily.  Plan  We will continue with the Toprol-XL at 12.5 mg by mouth daily and hydrochlorothiazide at 12.5 mg by mouth daily. We will reassess the blood pressure controlled follow-up visit.

## 2016-06-29 NOTE — Progress Notes (Signed)
   Subjective:    Patient ID: Larry Wiley, male    DOB: Jun 10, 1966, 50 y.o.   MRN: [id number]  HPI  Larry Wiley is here for follow-up of his diabetic control, hypertension, weight, and rosacea. Please see the A&P for the status of the pt's chronic medical problems.  Review of Systems  Constitutional: Negative for activity change, appetite change and unexpected weight change.  Respiratory: Negative for shortness of breath and wheezing.   Cardiovascular: Negative for chest pain, palpitations and leg swelling.  Gastrointestinal: Negative for abdominal distention, abdominal pain, constipation, diarrhea, nausea and vomiting.  Skin: Positive for rash.       Rosacea rash improved but persistent.      Objective:   Physical Exam  Constitutional: He is oriented to person, place, and time. He appears well-developed and well-nourished. No distress.  HENT:  Head: Normocephalic and atraumatic.  Eyes: Conjunctivae are normal. Right eye exhibits no discharge. Left eye exhibits no discharge. No scleral icterus.  Cardiovascular: Normal rate, regular rhythm and normal heart sounds.  Exam reveals no gallop and no friction rub.   No murmur heard. Pulmonary/Chest: Effort normal and breath sounds normal. No respiratory distress. He has no wheezes. He has no rales.  Abdominal: Soft. Bowel sounds are normal. He exhibits no distension. There is no tenderness. There is no rebound and no guarding.  Musculoskeletal: Normal range of motion. He exhibits no edema or tenderness.  Neurological: He is alert and oriented to person, place, and time.  Skin: Skin is warm and dry. Rash noted. He is not diaphoretic. No erythema.  Rosacea remains but is improved from previous visit.  Psychiatric: He has a normal mood and affect. His behavior is normal.  Nursing note and vitals reviewed.     Assessment & Plan:   Please see problem oriented charting.

## 2016-06-29 NOTE — Assessment & Plan Note (Signed)
Assessment  He has had some improvement in his rosacea on the metronidazole cream 0.75% daily. After 2 months this therapy was stopped. At this point I suspect he requires long-term therapy with this medication.  Plan  We will continue with the metronidazole cream 0.75% daily long-term and reassess the clinical response at the follow-up visit.

## 2016-09-08 ENCOUNTER — Encounter: Payer: Self-pay | Admitting: *Deleted

## 2016-09-19 DIAGNOSIS — H01022 Squamous blepharitis right lower eyelid: Secondary | ICD-10-CM | POA: Diagnosis not present

## 2016-09-19 DIAGNOSIS — H02831 Dermatochalasis of right upper eyelid: Secondary | ICD-10-CM | POA: Diagnosis not present

## 2016-09-19 DIAGNOSIS — H01021 Squamous blepharitis right upper eyelid: Secondary | ICD-10-CM | POA: Diagnosis not present

## 2016-09-19 DIAGNOSIS — E119 Type 2 diabetes mellitus without complications: Secondary | ICD-10-CM | POA: Diagnosis not present

## 2016-09-19 DIAGNOSIS — H01024 Squamous blepharitis left upper eyelid: Secondary | ICD-10-CM | POA: Diagnosis not present

## 2016-09-19 DIAGNOSIS — H40013 Open angle with borderline findings, low risk, bilateral: Secondary | ICD-10-CM | POA: Diagnosis not present

## 2016-09-19 DIAGNOSIS — H01025 Squamous blepharitis left lower eyelid: Secondary | ICD-10-CM | POA: Diagnosis not present

## 2016-09-19 DIAGNOSIS — H02834 Dermatochalasis of left upper eyelid: Secondary | ICD-10-CM | POA: Diagnosis not present

## 2016-09-19 DIAGNOSIS — H00024 Hordeolum internum left upper eyelid: Secondary | ICD-10-CM | POA: Diagnosis not present

## 2016-09-19 DIAGNOSIS — H2513 Age-related nuclear cataract, bilateral: Secondary | ICD-10-CM | POA: Diagnosis not present

## 2016-09-19 DIAGNOSIS — H5500 Unspecified nystagmus: Secondary | ICD-10-CM | POA: Diagnosis not present

## 2016-09-19 LAB — HM DIABETES EYE EXAM

## 2016-09-29 ENCOUNTER — Ambulatory Visit: Payer: Self-pay | Admitting: Internal Medicine

## 2016-10-02 ENCOUNTER — Telehealth: Payer: Self-pay | Admitting: Dietician

## 2016-10-02 ENCOUNTER — Ambulatory Visit (INDEPENDENT_AMBULATORY_CARE_PROVIDER_SITE_OTHER): Payer: Medicare Other | Admitting: Internal Medicine

## 2016-10-02 ENCOUNTER — Encounter: Payer: Self-pay | Admitting: Internal Medicine

## 2016-10-02 VITALS — BP 117/80 | HR 85 | Temp 98.4°F | Wt 143.7 lb

## 2016-10-02 DIAGNOSIS — Z79899 Other long term (current) drug therapy: Secondary | ICD-10-CM | POA: Diagnosis not present

## 2016-10-02 DIAGNOSIS — E785 Hyperlipidemia, unspecified: Secondary | ICD-10-CM

## 2016-10-02 DIAGNOSIS — E1139 Type 2 diabetes mellitus with other diabetic ophthalmic complication: Secondary | ICD-10-CM

## 2016-10-02 DIAGNOSIS — R809 Proteinuria, unspecified: Secondary | ICD-10-CM | POA: Diagnosis not present

## 2016-10-02 DIAGNOSIS — E7849 Other hyperlipidemia: Secondary | ICD-10-CM

## 2016-10-02 DIAGNOSIS — H4010X0 Unspecified open-angle glaucoma, stage unspecified: Secondary | ICD-10-CM | POA: Diagnosis not present

## 2016-10-02 DIAGNOSIS — Z7984 Long term (current) use of oral hypoglycemic drugs: Secondary | ICD-10-CM | POA: Diagnosis not present

## 2016-10-02 DIAGNOSIS — H4010X4 Unspecified open-angle glaucoma, indeterminate stage: Secondary | ICD-10-CM

## 2016-10-02 DIAGNOSIS — E1129 Type 2 diabetes mellitus with other diabetic kidney complication: Secondary | ICD-10-CM

## 2016-10-02 DIAGNOSIS — E663 Overweight: Secondary | ICD-10-CM

## 2016-10-02 DIAGNOSIS — L718 Other rosacea: Secondary | ICD-10-CM | POA: Diagnosis not present

## 2016-10-02 DIAGNOSIS — I10 Essential (primary) hypertension: Secondary | ICD-10-CM

## 2016-10-02 DIAGNOSIS — Z6826 Body mass index (BMI) 26.0-26.9, adult: Secondary | ICD-10-CM

## 2016-10-02 LAB — GLUCOSE, CAPILLARY: Glucose-Capillary: 140 mg/dL — ABNORMAL HIGH (ref 65–99)

## 2016-10-02 LAB — POCT GLYCOSYLATED HEMOGLOBIN (HGB A1C): Hemoglobin A1C: 8.3

## 2016-10-02 MED ORDER — ACCU-CHEK GUIDE W/DEVICE KIT: 1.0000 | PACK | Freq: Every day | 1 refills | Status: DC

## 2016-10-02 MED ORDER — ACCU-CHEK FASTCLIX LANCETS MISC
5 refills | Status: DC
Start: 2016-10-02 — End: 2024-03-12

## 2016-10-02 MED ORDER — SITAGLIPTIN PHOSPHATE 100 MG PO TABS: 100.0000 mg | ORAL_TABLET | Freq: Every day | ORAL | 3 refills | Status: DC

## 2016-10-02 MED ORDER — GLUCOSE BLOOD VI STRP: ORAL_STRIP | 5 refills | Status: DC

## 2016-10-02 NOTE — Assessment & Plan Note (Signed)
Assessment  His diabetes remains less than ideally controlled with a hemoglobin A1c of 8.3 today. This is unchanged from 3 months ago. He will occasionally check his blood glucose and we have 5 readings over the last 3 months. 20% are within target and 80% or above target. There has been no symptomatic hypoglycemic episodes. His average glucose is 159. His weight has remained stable and he states that he has been much better about the portion sizes of mashed potatoes that he has been eating. He also states he's been eating less cookies. He had lemon flavored asparagus yesterday which he very much enjoyed. He has not been active since his inside dog died but there is a walking trail near his home that his sister say is relatively flat.  Plan  We will continue the glipizide XL 10 mg by mouth twice daily, metformin 1000 mg by mouth twice daily, and start Januvia 100 mg by mouth daily. I continued to encourage eating non-starchy vegetables, and praised him on his enjoyment of asparagus. His sisters will try to get him outside more to walk. We will reassess his diabetic control with all of these measures at the follow-up visit with a repeat hemoglobin A1c.

## 2016-10-02 NOTE — Assessment & Plan Note (Signed)
Assessment  His blood pressure control today was excellent at 117/80. This is on hydrochlorothiazide 12.5 mg by mouth daily and Toprol-XL 12.5 mg by mouth daily. He is tolerating this regimen well without side effects.  Plan  We will continue the hydrochlorothiazide and Toprol-XL both at 12.5 mg by mouth daily and reassess his blood pressure control at the follow-up visit.

## 2016-10-02 NOTE — Patient Instructions (Signed)
It was good to see you again as always!  1) Keep working on [employer] Holdingsyour diet.  I am glad you like the asparagus.  2) I started januvia 100 mg daily for your diabetes.  3) Keep taking your other medications as you are.  4) I checked your liver function today.  I will call next week when I get the results.  5) We will contact your insurance company to find out their preferred diabetes test strips and write for that brand and get you that particular meter.  I will see you back in 3 months, sooner if necessary.

## 2016-10-02 NOTE — Assessment & Plan Note (Signed)
Assessment  He was recently evaluated by his ophthalmologist Dr. Dione BoozeGroat and was maintained on the latanoprost 0.005% 1 drop each eye at bedtime. He was also given a prescription for erythromycin ointment 0.5% applied to the eyelids daily.  Plan  We will try to obtain the most recent progress note from his ophthalmologist for our records. In the meantime we will continue with the therapy as guided by the ophthalmologist.

## 2016-10-02 NOTE — Progress Notes (Signed)
   Subjective:    Patient ID: Larry Wiley, male    DOB: 1966-06-01, 50 y.o.   MRN: [id number]  HPI  Larry Wiley is here for follow-up of his diabetes, weight, and blood pressure. Please see the A&P for the status of the pt's chronic medical problems.  Review of Systems  Constitutional: Negative for activity change, appetite change and unexpected weight change.  Respiratory: Negative for shortness of breath and wheezing.   Cardiovascular: Negative for chest pain and leg swelling.  Gastrointestinal: Negative for abdominal pain, constipation, diarrhea, nausea and vomiting.  Musculoskeletal: Negative for arthralgias, back pain, gait problem, joint swelling and myalgias.      Objective:   Physical Exam  Constitutional: He is oriented to person, place, and time. He appears well-developed and well-nourished. No distress.  HENT:  Head: Normocephalic and atraumatic.  Eyes: Conjunctivae are normal. Right eye exhibits no discharge. Left eye exhibits no discharge. No scleral icterus.  Cardiovascular: Normal rate, regular rhythm and normal heart sounds.  Exam reveals no gallop and no friction rub.   No murmur heard. Pulmonary/Chest: Effort normal and breath sounds normal. No respiratory distress. He has no wheezes. He has no rales.  Abdominal: Soft. He exhibits no distension. There is no tenderness. There is no rebound.  Musculoskeletal: Normal range of motion. He exhibits no edema, tenderness or deformity.  Neurological: He is alert and oriented to person, place, and time. He exhibits normal muscle tone.  Skin: Skin is warm and dry. No rash noted. He is not diaphoretic. No erythema.  Psychiatric: He has a normal mood and affect. His behavior is normal. Judgment normal.  Nursing note and vitals reviewed.     Assessment & Plan:   Please see problem oriented charting.

## 2016-10-02 NOTE — Telephone Encounter (Signed)
Called to let patient/sisters know that I can help with meter and diabetes as needed. Call back information left on message.

## 2016-10-02 NOTE — Assessment & Plan Note (Addendum)
Assessment  He is tolerating the atorvastatin 40 mg by mouth daily without myalgias.  Plan  We will continue this high intensity statin at 40 mg by mouth daily and reassess for intolerances at the follow-up visit.

## 2016-10-02 NOTE — Assessment & Plan Note (Signed)
Assessment  His papular acne rosacea is clinically slightly improved from the last visit while continuing with the MetroGel. Nonetheless, it is still present and will require continued therapy at this point.  Plan  We will continue with the metronidazole 0.75% cream to the face daily for the papular acne rosacea and reassess clinically at the follow-up visit.

## 2016-10-02 NOTE — Assessment & Plan Note (Signed)
Assessment  His weight is the same as it was 3 months ago to the ounce. He was praised on his ability to maintain his weight. Our goal will be for some mild weight loss which may help control his diabetes. This is likely to be best accomplished with increased physical activity.  Plan  We will reassess his weight at the follow-up visit. In the interim his sisters will try to get him to walk more on the trail near his neighborhood.

## 2016-10-03 LAB — HEPATIC FUNCTION PANEL
ALBUMIN: 4.5 g/dL (ref 3.5–5.5)
ALT: 45 IU/L — ABNORMAL HIGH (ref 0–44)
AST: 25 IU/L (ref 0–40)
Alkaline Phosphatase: 50 IU/L (ref 39–117)
BILIRUBIN TOTAL: 0.6 mg/dL (ref 0.0–1.2)
BILIRUBIN, DIRECT: 0.21 mg/dL (ref 0.00–0.40)
TOTAL PROTEIN: 7 g/dL (ref 6.0–8.5)

## 2016-10-03 NOTE — Progress Notes (Signed)
Patient ID: Larry CousinJohn A Corrigan, male   DOB: [birth year], 50 y.o.   MRN: [id number]  LFTs: ALT 45 (ULN 44), otherwise unremarkable.  Improved transaminitis from the previous values.  I discussed the results with his sister over the phone.

## 2016-10-26 ENCOUNTER — Encounter: Payer: Self-pay | Admitting: *Deleted

## 2016-11-01 ENCOUNTER — Encounter: Payer: Self-pay | Admitting: *Deleted

## 2016-11-09 ENCOUNTER — Other Ambulatory Visit: Payer: Self-pay | Admitting: Internal Medicine

## 2016-12-28 ENCOUNTER — Ambulatory Visit (INDEPENDENT_AMBULATORY_CARE_PROVIDER_SITE_OTHER): Payer: Medicare Other | Admitting: Internal Medicine

## 2016-12-28 ENCOUNTER — Encounter: Payer: Self-pay | Admitting: Internal Medicine

## 2016-12-28 VITALS — BP 128/75 | HR 99 | Temp 98.2°F | Wt 147.0 lb

## 2016-12-28 DIAGNOSIS — E785 Hyperlipidemia, unspecified: Secondary | ICD-10-CM | POA: Diagnosis not present

## 2016-12-28 DIAGNOSIS — R200 Anesthesia of skin: Secondary | ICD-10-CM | POA: Diagnosis not present

## 2016-12-28 DIAGNOSIS — L718 Other rosacea: Secondary | ICD-10-CM

## 2016-12-28 DIAGNOSIS — Z0000 Encounter for general adult medical examination without abnormal findings: Secondary | ICD-10-CM

## 2016-12-28 DIAGNOSIS — Z7984 Long term (current) use of oral hypoglycemic drugs: Secondary | ICD-10-CM

## 2016-12-28 DIAGNOSIS — Z6826 Body mass index (BMI) 26.0-26.9, adult: Secondary | ICD-10-CM | POA: Diagnosis not present

## 2016-12-28 DIAGNOSIS — Z79899 Other long term (current) drug therapy: Secondary | ICD-10-CM | POA: Diagnosis not present

## 2016-12-28 DIAGNOSIS — E1129 Type 2 diabetes mellitus with other diabetic kidney complication: Secondary | ICD-10-CM | POA: Diagnosis not present

## 2016-12-28 DIAGNOSIS — E7849 Other hyperlipidemia: Secondary | ICD-10-CM

## 2016-12-28 DIAGNOSIS — R808 Other proteinuria: Secondary | ICD-10-CM | POA: Diagnosis not present

## 2016-12-28 DIAGNOSIS — I10 Essential (primary) hypertension: Secondary | ICD-10-CM | POA: Diagnosis not present

## 2016-12-28 DIAGNOSIS — H4010X0 Unspecified open-angle glaucoma, stage unspecified: Secondary | ICD-10-CM | POA: Diagnosis not present

## 2016-12-28 DIAGNOSIS — Z23 Encounter for immunization: Secondary | ICD-10-CM | POA: Diagnosis not present

## 2016-12-28 DIAGNOSIS — L719 Rosacea, unspecified: Secondary | ICD-10-CM

## 2016-12-28 DIAGNOSIS — H579 Unspecified disorder of eye and adnexa: Secondary | ICD-10-CM

## 2016-12-28 DIAGNOSIS — E663 Overweight: Secondary | ICD-10-CM | POA: Diagnosis not present

## 2016-12-28 DIAGNOSIS — R202 Paresthesia of skin: Secondary | ICD-10-CM

## 2016-12-28 DIAGNOSIS — E1139 Type 2 diabetes mellitus with other diabetic ophthalmic complication: Secondary | ICD-10-CM

## 2016-12-28 DIAGNOSIS — H4010X4 Unspecified open-angle glaucoma, indeterminate stage: Secondary | ICD-10-CM

## 2016-12-28 DIAGNOSIS — R809 Proteinuria, unspecified: Principal | ICD-10-CM

## 2016-12-28 LAB — GLUCOSE, CAPILLARY: Glucose-Capillary: 220 mg/dL — ABNORMAL HIGH (ref 65–99)

## 2016-12-28 LAB — POCT GLYCOSYLATED HEMOGLOBIN (HGB A1C): HEMOGLOBIN A1C: 6.7

## 2016-12-28 NOTE — Assessment & Plan Note (Signed)
Assessment  He notes the interim development of intermittent numbness and tingling of the left thumb. Nothing seems to bring this on and by moving the thumb he is able to resolve the numbness and tingling. He denies any trauma and exam for evidence of nerve entrapment or carpal tunnel syndrome was unremarkable. Therefore, the cause of this intermittent left thumb numbness is unclear at this time, but may be associated with left first metacarpal osteoarthritis that he may be developing.  Plan  We will continue to follow his symptoms over the next several months and reassess at the return visit.

## 2016-12-28 NOTE — Patient Instructions (Addendum)
It is always good to see you again!  Great job with your diabetes!  1) Keep taking the medications as you are.  2) When the weather gets better try to walk the trails more.  This will help your weight.  3) Keep eating your vegetables.  This hopefully will fill you up so you eat less sweets (an occasional sweet is OK).  I will see you back in 6 months, sooner if necessary.

## 2016-12-28 NOTE — Assessment & Plan Note (Signed)
He received the Prevnar-13 vaccination today to complete the series until he is [id number]. He is otherwise up-to-date on his health care maintenance.

## 2016-12-28 NOTE — Assessment & Plan Note (Signed)
Assessment  His weight is up 4 pounds in the last 3 months. He has been unable to walk secondary to the poor weather. His sisters note he has been good with his diet and is eating more vegetables and avoiding sweets more than before.  Plan  He was encouraged to continue with his dietary changes and walk on the trails around his home once the weather improves. We will reassess his weight at the follow-up visit.

## 2016-12-28 NOTE — Progress Notes (Signed)
   Subjective:    Patient ID: Larry Wiley, male    DOB: [birth year], 51 y.o.   MRN: [id number]  HPI  Larry Wiley is here for follow-up of his diabetes, hypertension, weight, and acne rosacea. Please see the A&P for the status of the pt's chronic medical problems.  Review of Systems  Constitutional: Negative for activity change, appetite change and unexpected weight change.  Eyes: Positive for discharge.       Eye discharge improving with morning wipes and hot compresses 2-3 times a day  Respiratory: Negative for shortness of breath.   Cardiovascular: Negative for chest pain and leg swelling.  Gastrointestinal: Negative for abdominal distention, abdominal pain, constipation, diarrhea, nausea and vomiting.  Musculoskeletal: Negative for arthralgias, back pain, gait problem, joint swelling and myalgias.  Skin: Positive for rash. Negative for wound.       Acne rosacia improving with the metrogel  Neurological: Positive for numbness.       Intermittent left thumb numbness      Objective:   Physical Exam  Constitutional: He is oriented to person, place, and time. He appears well-developed and well-nourished. No distress.  HENT:  Head: Normocephalic and atraumatic.  Cardiovascular: Normal rate and regular rhythm.  Exam reveals no gallop and no friction rub.   No murmur heard. Pulmonary/Chest: Effort normal and breath sounds normal. No respiratory distress. He has no wheezes. He has no rales.  Abdominal: Soft. He exhibits no distension.  Musculoskeletal: Normal range of motion. He exhibits no edema, tenderness or deformity.  Neurological: He is alert and oriented to person, place, and time. He exhibits normal muscle tone.  Skin: Skin is warm and dry. Rash noted. He is not diaphoretic. No erythema.  Marked improvement in acne rosacea with minimal rash at this time  Psychiatric: He has a normal mood and affect. His behavior is normal.  Nursing note and vitals reviewed.     Assessment &  Plan:   Please see problem oriented charting.

## 2016-12-28 NOTE — Assessment & Plan Note (Signed)
Assessment  He is tolerating his atorvastatin at 40 mg by mouth daily. He denies any myalgias with this therapy.  Plan  We will continue this high intensity statin and reassess for intolerances at the follow-up visit.

## 2016-12-28 NOTE — Assessment & Plan Note (Signed)
Assessment  His glycemic control is markedly improved with the initiation of Januvia. His hemoglobin A1c today is 6.7 down from 8.3 just 3 months ago. This is on glipizide XL 10 mg by mouth twice daily, metformin 1000 mg by mouth twice daily, and Januvia 100 mg by mouth daily. He also continues to work on eating more vegetables and less sweets. Because of the weather he has been unable to be more active. Review of his meter reveals 12 readings in the last 3 months with an average glucose of 134 and the highest being 270 which was after a meal and lowest being 65 which was mildly symptomatic. This occurred during a day where he did not eat lunch. He has had no other hypoglycemic episodes. We did not assess his microalbuminuria during this visit. He is up-to-date on his diabetic health care maintenance.  Plan  He was praised for his excellent diabetic control with the modification in his diet as well as his compliance with the medications. We will continue glipizide XL at 10 mg by mouth twice daily, metformin 1000 mg by mouth twice daily, Januvia 100 mg by mouth daily. I encouraged him to continue with the vegetables and portion control as well as avoiding sweets is much as possible. I also asked him to increase his activity by walking the trails near his home once the weather improves. We will reassess his diabetic control on this regimen and these lifestyle modifications at the follow-up visit with a repeat hemoglobin A1c.

## 2016-12-28 NOTE — Assessment & Plan Note (Signed)
Assessment  His blood pressure is well within target today 128/75 on hydrochlorothiazide for 12.5 mg by mouth daily and Toprol-XL 12.5 mg by mouth daily.  Plan  We will continue the hydrochlorothiazide at 12.5 mg by mouth daily and Toprol-XL 12.5 g by mouth daily and reassess his blood pressure at the follow-up visit.

## 2016-12-28 NOTE — Assessment & Plan Note (Signed)
Assessment  He is followed closely by ophthalmology and is tolerating the latanoprost 0.005% solution 1 drop each night without difficulty.  Plan  He will continue to follow with ophthalmology for his open angle glaucoma, and continue to take the latanoprost 0.005% solution 1 drop in each eye each night. We will reassess his open angle glaucoma and tolerance of this therapy at the follow-up visit.

## 2016-12-28 NOTE — Assessment & Plan Note (Signed)
Assessment  His acne rosacea has responded well to the as needed metronidazole cream.  Plan  We will continue the as needed metronidazole 0.75% cream. We will reassess the efficacy of this therapy at the follow-up visit.

## 2016-12-29 ENCOUNTER — Ambulatory Visit: Payer: Self-pay | Admitting: Internal Medicine

## 2017-02-07 ENCOUNTER — Other Ambulatory Visit: Payer: Self-pay | Admitting: Internal Medicine

## 2017-02-07 DIAGNOSIS — R809 Proteinuria, unspecified: Principal | ICD-10-CM

## 2017-02-07 DIAGNOSIS — I10 Essential (primary) hypertension: Secondary | ICD-10-CM

## 2017-02-07 DIAGNOSIS — E1129 Type 2 diabetes mellitus with other diabetic kidney complication: Secondary | ICD-10-CM

## 2017-06-01 ENCOUNTER — Other Ambulatory Visit: Payer: Self-pay | Admitting: Internal Medicine

## 2017-06-01 DIAGNOSIS — I10 Essential (primary) hypertension: Secondary | ICD-10-CM

## 2017-06-17 ENCOUNTER — Other Ambulatory Visit: Payer: Self-pay | Admitting: Internal Medicine

## 2017-06-17 DIAGNOSIS — R809 Proteinuria, unspecified: Principal | ICD-10-CM

## 2017-06-17 DIAGNOSIS — E785 Hyperlipidemia, unspecified: Secondary | ICD-10-CM

## 2017-06-17 DIAGNOSIS — E1129 Type 2 diabetes mellitus with other diabetic kidney complication: Secondary | ICD-10-CM

## 2017-07-27 ENCOUNTER — Encounter: Payer: Self-pay | Admitting: Internal Medicine

## 2017-07-27 ENCOUNTER — Ambulatory Visit (INDEPENDENT_AMBULATORY_CARE_PROVIDER_SITE_OTHER): Payer: Medicare Other | Admitting: Internal Medicine

## 2017-07-27 VITALS — BP 119/76 | HR 81 | Wt 145.7 lb

## 2017-07-27 DIAGNOSIS — L718 Other rosacea: Secondary | ICD-10-CM

## 2017-07-27 DIAGNOSIS — Z6826 Body mass index (BMI) 26.0-26.9, adult: Secondary | ICD-10-CM | POA: Diagnosis not present

## 2017-07-27 DIAGNOSIS — R809 Proteinuria, unspecified: Secondary | ICD-10-CM

## 2017-07-27 DIAGNOSIS — E663 Overweight: Secondary | ICD-10-CM

## 2017-07-27 DIAGNOSIS — L719 Rosacea, unspecified: Secondary | ICD-10-CM | POA: Diagnosis not present

## 2017-07-27 DIAGNOSIS — I10 Essential (primary) hypertension: Secondary | ICD-10-CM | POA: Diagnosis not present

## 2017-07-27 DIAGNOSIS — Z23 Encounter for immunization: Secondary | ICD-10-CM | POA: Diagnosis not present

## 2017-07-27 DIAGNOSIS — H4010X1 Unspecified open-angle glaucoma, mild stage: Secondary | ICD-10-CM

## 2017-07-27 DIAGNOSIS — E7800 Pure hypercholesterolemia, unspecified: Secondary | ICD-10-CM

## 2017-07-27 DIAGNOSIS — E1129 Type 2 diabetes mellitus with other diabetic kidney complication: Secondary | ICD-10-CM | POA: Diagnosis not present

## 2017-07-27 LAB — GLUCOSE, CAPILLARY: Glucose-Capillary: 167 mg/dL — ABNORMAL HIGH (ref 65–99)

## 2017-07-27 LAB — POCT GLYCOSYLATED HEMOGLOBIN (HGB A1C): Hemoglobin A1C: 7.2

## 2017-07-27 MED ORDER — LATANOPROST 0.005 % OP SOLN: 1.0000 [drp] | Freq: Every day | OPHTHALMIC | 3 refills | Status: DC

## 2017-07-27 NOTE — Assessment & Plan Note (Signed)
Assessment  He has lost 2 pounds since the last clinic visit. He was praised for this result given the effort both with activity and portion control that it required.  Plan  He was encouraged to continue to remain active while the weather remained good and to watch his portions and continue to eat more vegetables. We will reassess his weight at the follow-up visit.

## 2017-07-27 NOTE — Assessment & Plan Note (Signed)
Assessment  He is tolerating the atorvastatin 40 mg by mouth daily without myalgias.  Plan  We will continue with this high intensity statin and reassess for intolerances at the follow-up visit. 

## 2017-07-27 NOTE — Patient Instructions (Signed)
It was good to see you today.  You are doing a wonderful job with your health.  1) Keep eating vegetables and avoiding starches like potatoes.  2) Walk/exercise as much as you can.  3) Keep taking the medications as you are.  4) Follow-up with Dr. Dione Booze as planned.  5) We gave you the flu shot today.  6) I checked some blood and urine studies.  I will call early next week if there are any issues.  I will see you in 3 months, sooner if necessary.

## 2017-07-27 NOTE — Assessment & Plan Note (Signed)
Assessment  His diabetes is well controlled today with a hemoglobin A1c of 7.2. This is on metformin 1000 mg by mouth twice daily, glipizide XL 10 mg by mouth twice daily, and Januvia 100 mg by mouth daily. I reviewed his blood sugar log as well as his meter readings. The meter has 1 test which was done within the last month and was 159. A urine for microalbumin was obtained today and is pending at the time of this dictation.  Plan  We will continue the metformin at 1000 mg by mouth twice daily, glipizide XL 10 mg by mouth twice daily, and Januvia 100 mg by mouth daily. We will reassess his diabetic control with a repeat hemoglobin A1c at follow-up. We will also follow-up on the results of the urine microalbumin obtained today.

## 2017-07-27 NOTE — Progress Notes (Signed)
   Subjective:    Patient ID: Larry Wiley, male    DOB: 03/15/1966, 51 y.o.   MRN: [id number]  HPI  Larry Wiley is here for follow-up of his diabetes, essential hypertension, hyperlipidemia, acne rosacea, and overweight status. Please see the A&P for the status of the pt's chronic medical problems.  Review of Systems  Constitutional: Negative for activity change, appetite change, fatigue and unexpected weight change.  Respiratory: Negative for chest tightness and shortness of breath.   Cardiovascular: Negative for chest pain, palpitations and leg swelling.  Gastrointestinal: Negative for abdominal pain, constipation, diarrhea, nausea and vomiting.  Genitourinary: Negative for difficulty urinating.  Musculoskeletal: Negative for arthralgias, back pain, gait problem and myalgias.  Skin: Negative for rash and wound.      Objective:   Physical Exam  Constitutional: He is oriented to person, place, and time. He appears well-developed and well-nourished. No distress.  HENT:  Head: Normocephalic and atraumatic.  Eyes: Conjunctivae are normal. Right eye exhibits no discharge. Left eye exhibits no discharge. No scleral icterus.  Cardiovascular: Normal rate, regular rhythm and normal heart sounds.  Exam reveals no gallop and no friction rub.   No murmur heard. Pulmonary/Chest: Effort normal and breath sounds normal. No respiratory distress. He has no wheezes. He has no rales.  Abdominal: Soft. Bowel sounds are normal. He exhibits no distension. There is no tenderness. There is no rebound and no guarding.  Musculoskeletal: Normal range of motion. He exhibits no edema, tenderness or deformity.  Neurological: He is alert and oriented to person, place, and time. He exhibits normal muscle tone.  Skin: Skin is warm and dry. No rash noted. He is not diaphoretic. No erythema.  Psychiatric: He has a normal mood and affect. His behavior is normal. Thought content normal.  Nursing note and vitals  reviewed.     Assessment & Plan:   Please see problem oriented charting.

## 2017-07-27 NOTE — Assessment & Plan Note (Signed)
Assessment  He continues using the latanoprost 0.005% solution 1 drop at bedtime. He has follow-up scheduled with Dr. Dione Booze in the near future.  Plan  He was encouraged to continue to use the eyedrop as prescribed by Dr. Dione Booze and follow-up as scheduled. They will assure Dr. Dione Booze sends his progress note for our records.

## 2017-07-27 NOTE — Assessment & Plan Note (Signed)
Assessment  His blood pressure today is excellent at 119/76. This is on hydrochlorothiazide 12.5 mg by mouth daily and Toprol-XL 12.5 mg daily.  Plan  We will continue both the hydrochlorothiazide and the Toprol-XL at 12.5 mg by mouth daily and reassess blood pressure control at the follow-up visit. A basic metabolic panel was obtained today to assure the kidney function and potassium are unremarkable. The results are pending at the time of this dictation.

## 2017-07-27 NOTE — Assessment & Plan Note (Signed)
He received the flu vaccination today. His family is not interested in colon cancer screening. He is therefore up-to-date on his health care maintenance.

## 2017-07-27 NOTE — Assessment & Plan Note (Signed)
Assessment  His acne rosacea responds well to as needed metronidazole gel.  Plan  We will continue the as needed metronidazole gel and reassess the efficacy of this therapy at the follow-up visit.

## 2017-07-28 LAB — BMP8+ANION GAP
Anion Gap: 17 mmol/L (ref 10.0–18.0)
BUN / CREAT RATIO: 15 (ref 9–20)
BUN: 15 mg/dL (ref 6–24)
CHLORIDE: 96 mmol/L (ref 96–106)
CO2: 24 mmol/L (ref 20–29)
Calcium: 9.8 mg/dL (ref 8.7–10.2)
Creatinine, Ser: 1 mg/dL (ref 0.76–1.27)
GFR calc Af Amer: 99 mL/min/{1.73_m2} (ref >59)
GFR calc non Af Amer: 86 mL/min/{1.73_m2} (ref >59)
GLUCOSE: 164 mg/dL — AB (ref 65–99)
Potassium: 4 mmol/L (ref 3.5–5.2)
SODIUM: 137 mmol/L (ref 134–144)

## 2017-07-28 LAB — MICROALBUMIN / CREATININE URINE RATIO
CREATININE, UR: 170.6 mg/dL
MICROALBUM., U, RANDOM: 27.3 ug/mL
Microalb/Creat Ratio: 16 mg/g creat (ref 0.0–30.0)

## 2017-07-30 IMAGING — CR DG CHEST 2V
2 series · 2 of 2 positions shown · non-contrast
Comparison: None.

CLINICAL DATA: Vomiting, loss of appetite, lethargy

EXAM:
CHEST  2 VIEW

[chest lat]
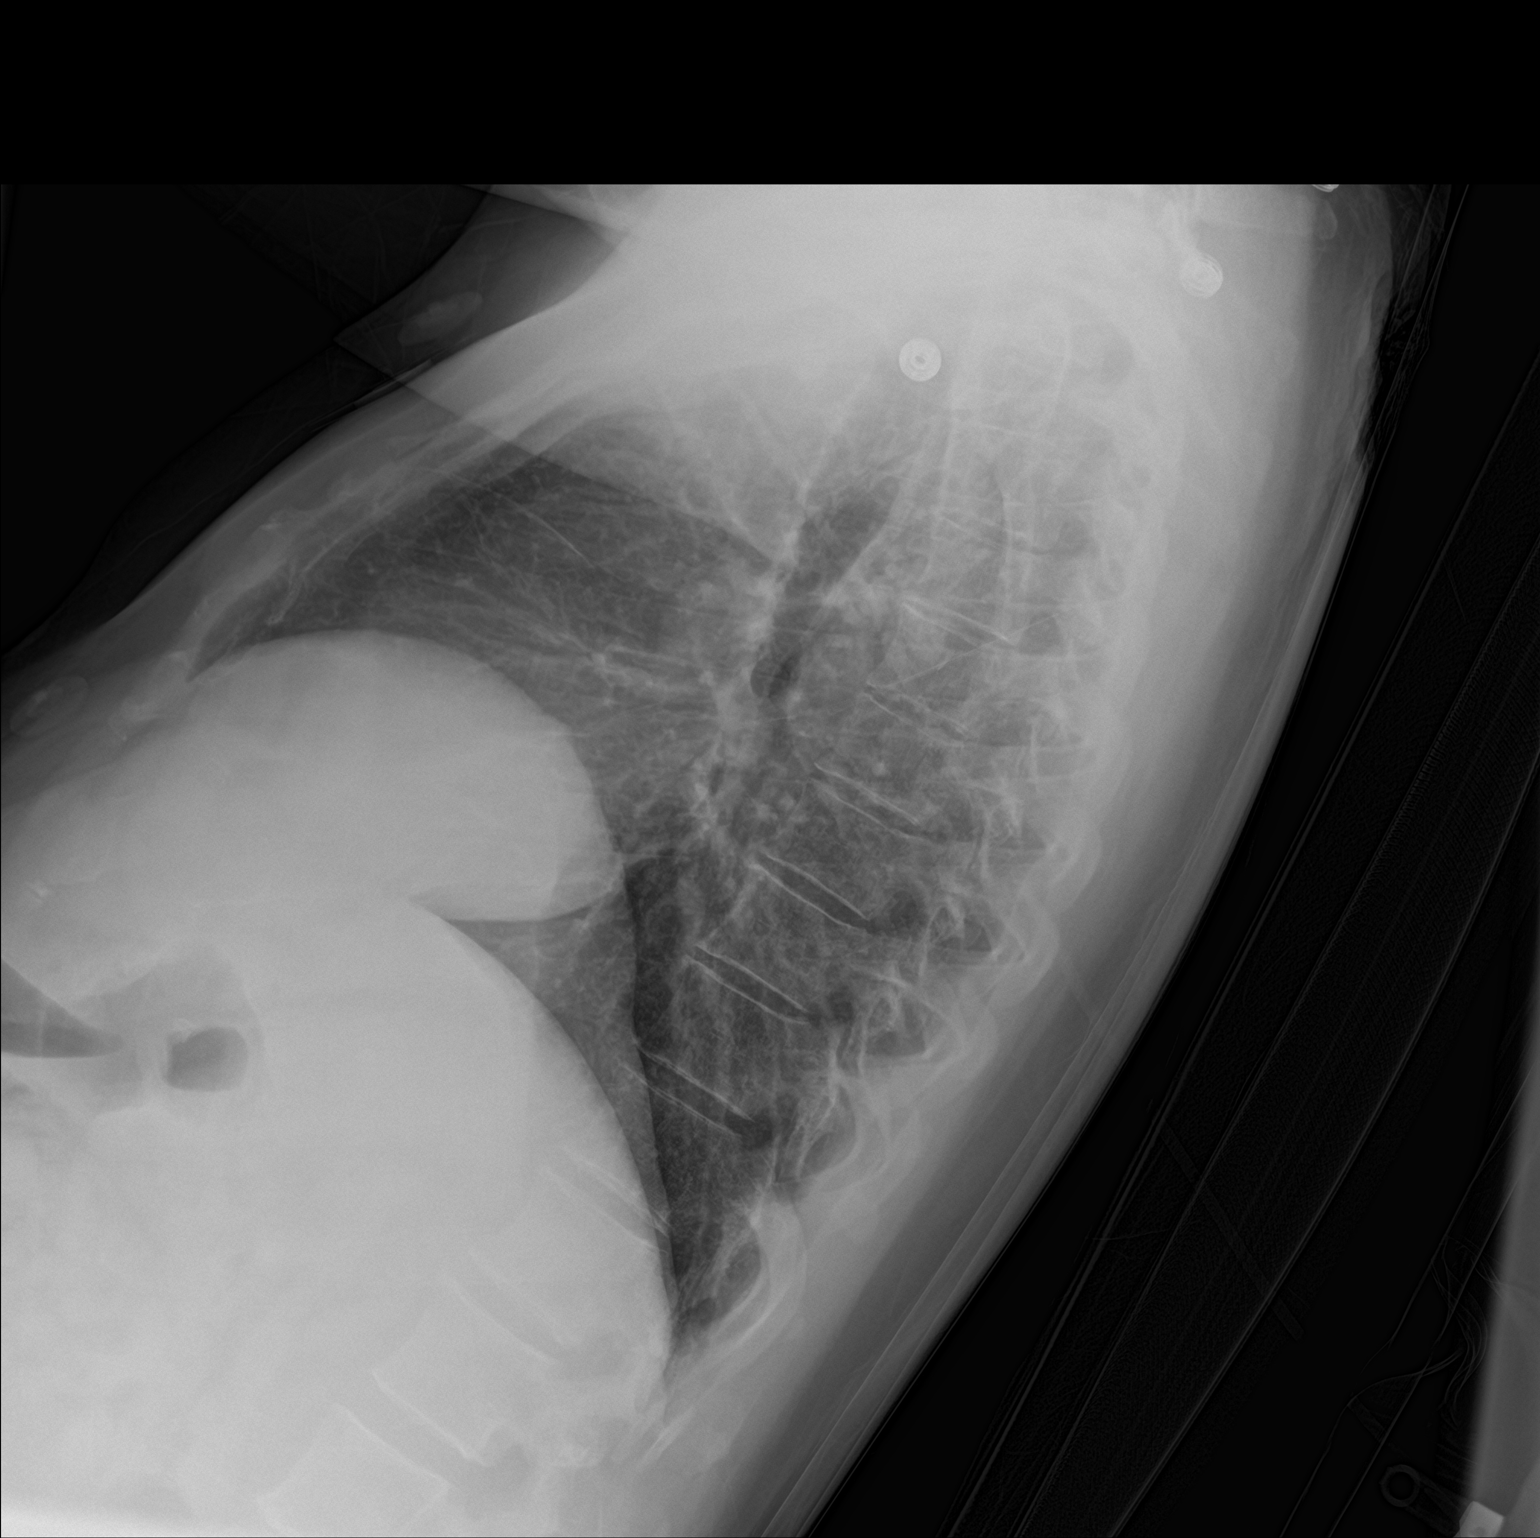

[chest ap]
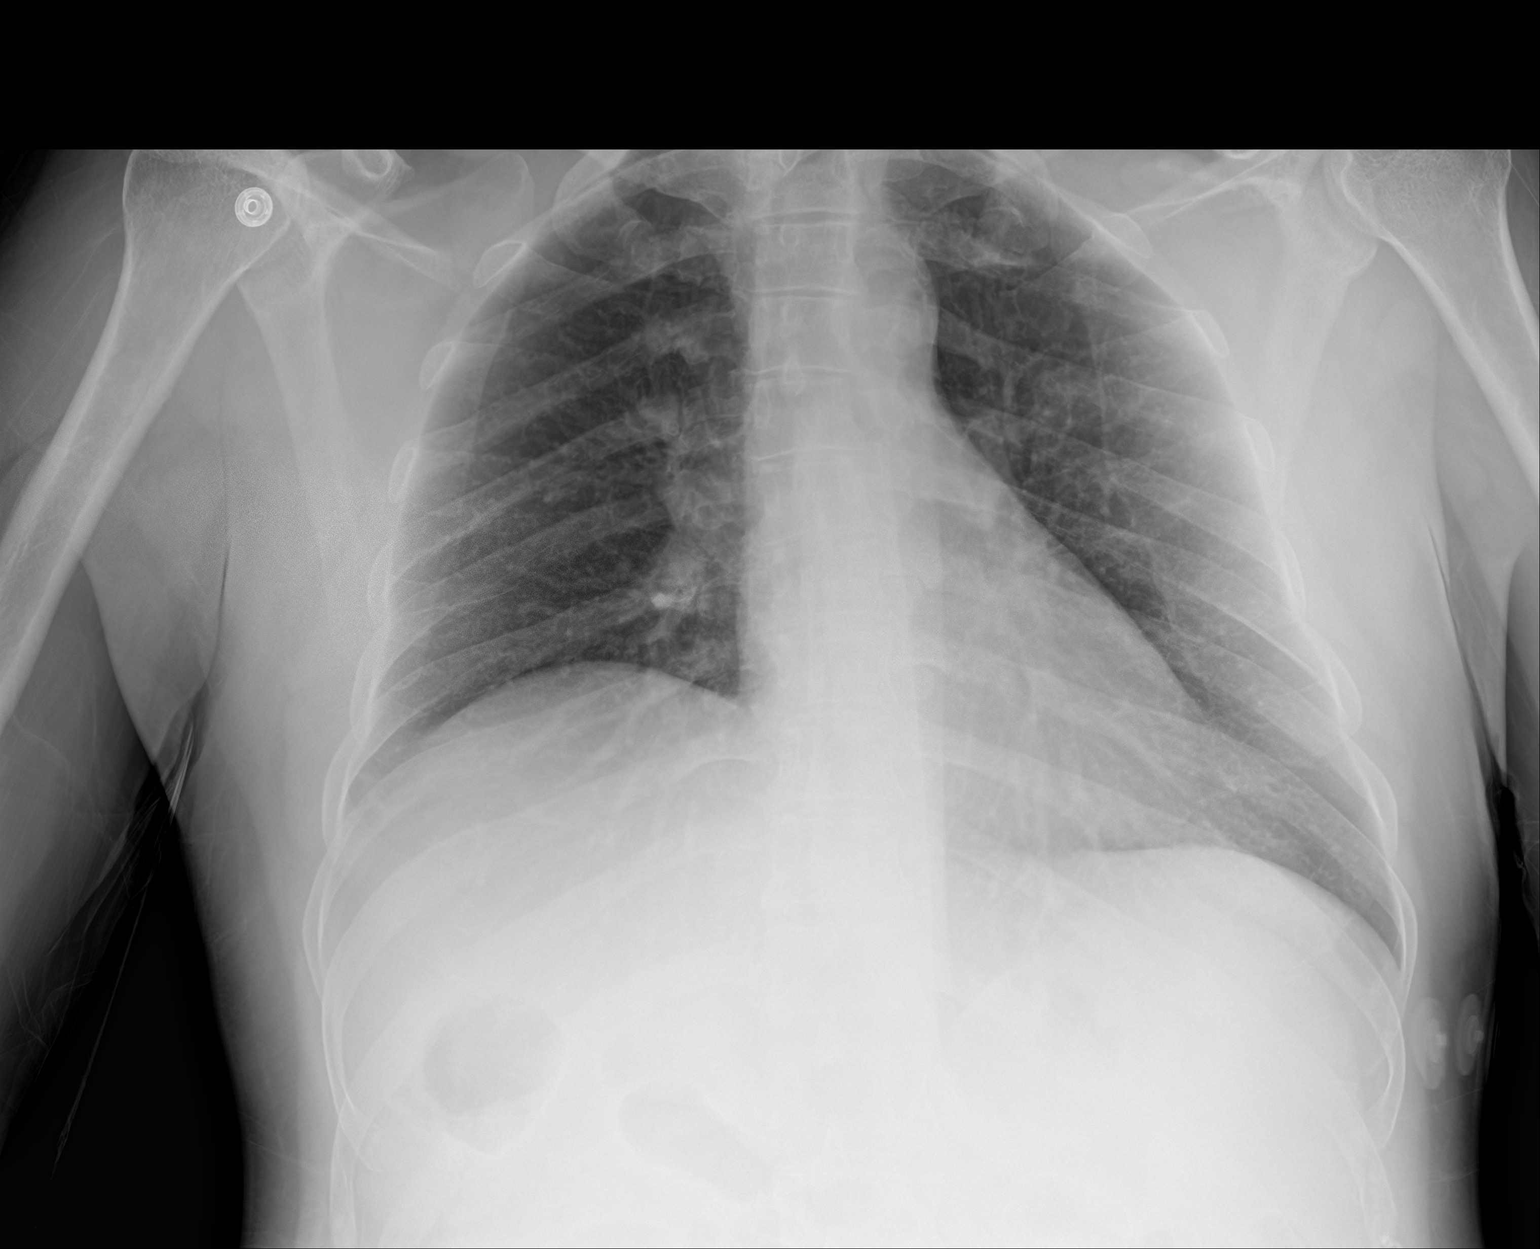

[2 of 2 positions shown; findings below may reference images not displayed]

FINDINGS: Lungs are clear.  No pleural effusion or pneumothorax.

The heart is normal in size.

Visualized osseous structures are within normal limits.
IMPRESSION: Normal chest radiographs.

## 2017-07-31 NOTE — Progress Notes (Signed)
Patient ID: Larry Wiley, male   DOB: Dec 08, 1965, 51 y.o.   MRN: [id number]  BMP unremarkable with normal K and eGFR.  Will continue current antihypertensives.  Urine microalbumin/creatinine ratio unremarkable on ACEI therapy.  We will continue the ACEI.

## 2017-09-01 ENCOUNTER — Other Ambulatory Visit: Payer: Self-pay | Admitting: Internal Medicine

## 2017-09-01 DIAGNOSIS — I10 Essential (primary) hypertension: Secondary | ICD-10-CM

## 2017-09-19 DIAGNOSIS — H40013 Open angle with borderline findings, low risk, bilateral: Secondary | ICD-10-CM | POA: Diagnosis not present

## 2017-09-19 DIAGNOSIS — H5500 Unspecified nystagmus: Secondary | ICD-10-CM | POA: Diagnosis not present

## 2017-09-19 DIAGNOSIS — E119 Type 2 diabetes mellitus without complications: Secondary | ICD-10-CM | POA: Diagnosis not present

## 2017-09-19 DIAGNOSIS — H2513 Age-related nuclear cataract, bilateral: Secondary | ICD-10-CM | POA: Diagnosis not present

## 2017-09-19 LAB — HM DIABETES EYE EXAM

## 2017-09-28 ENCOUNTER — Other Ambulatory Visit: Payer: Self-pay | Admitting: Internal Medicine

## 2017-09-28 DIAGNOSIS — E1129 Type 2 diabetes mellitus with other diabetic kidney complication: Secondary | ICD-10-CM

## 2017-09-28 DIAGNOSIS — R809 Proteinuria, unspecified: Principal | ICD-10-CM

## 2017-10-05 ENCOUNTER — Encounter: Payer: Self-pay | Admitting: *Deleted

## 2017-10-06 DIAGNOSIS — H6990 Unspecified Eustachian tube disorder, unspecified ear: Secondary | ICD-10-CM | POA: Diagnosis not present

## 2017-10-31 ENCOUNTER — Other Ambulatory Visit: Payer: Self-pay

## 2017-10-31 ENCOUNTER — Ambulatory Visit (INDEPENDENT_AMBULATORY_CARE_PROVIDER_SITE_OTHER): Payer: Medicare Other | Admitting: Internal Medicine

## 2017-10-31 ENCOUNTER — Encounter: Payer: Self-pay | Admitting: Internal Medicine

## 2017-10-31 DIAGNOSIS — H9041 Sensorineural hearing loss, unilateral, right ear, with unrestricted hearing on the contralateral side: Secondary | ICD-10-CM | POA: Insufficient documentation

## 2017-10-31 DIAGNOSIS — H9121 Sudden idiopathic hearing loss, right ear: Secondary | ICD-10-CM

## 2017-10-31 DIAGNOSIS — IMO0001 Reserved for inherently not codable concepts without codable children: Secondary | ICD-10-CM | POA: Insufficient documentation

## 2017-10-31 NOTE — Progress Notes (Signed)
   CC: Trouble hearing   HPI:  [name] is a 52 y.o. M with a past medical history as described below presents to the clinic for hearing difficulty.  He is accompanied by his sisters.  Patient states about 1 month ago he noticed decreased hearing from his right ear within 1 day.  He feels like sounds are muffled and also notes a "roaring" sound with varying intensity that occurs intermittently.  He denies ear pain, drainage from the ear, vertigo, fever, rhinorrhea.  He uses Q-tips and try to clean his ears shortly after symptom onset.  He presented to a urgent care around December 15 who provided fluticasone nasal spray and cetirizine which have not changed his symptoms, no cerumen impaction noted at the time.  He uses headphones to listen to music.  Past Medical History:  Diagnosis Date  . Acne rosacea, papular type 02/25/2016  . Bilateral inguinal hernia   . Essential hypertension 08/18/2006  . Gastroesophageal reflux disease 09/27/2012   Intermittent symptoms, does not want therapy   . Hyperlipidemia LDL goal < 100 05/03/2007  . Mental retardation 08/18/2006  . Open-angle glaucoma 07/25/2013  . Overweight (BMI 25.0-29.9) 09/27/2012  . Type 2 diabetes mellitus with proteinuria or microalbuminuria 08/18/2006   Review of Systems:  Review of Systems  Constitutional: Negative for fever.  HENT: Positive for hearing loss. Negative for congestion, ear discharge and ear pain.      Physical Exam:  Vitals:   10/31/17 0930  BP: (!) 145/87  Pulse: 75  Temp: 97.8 F (36.6 C)  TempSrc: Oral  SpO2: 100%  Weight: 149 lb 3.2 oz (67.7 kg)  Height: 5\' 2"  (1.575 m)  General: Sitting on exam table comfortably, no acute distress HEENT: R TM difficult to fully visualize but no erythema or fluid bulging, does appear more dull in color compared to L, no perforation appreciated. Hearing grossly decreased on R compared to left -> Weber test localizes to L, bone conduction > air on R.  CV:  RRR Resp: Clear breath sounds bilaterally, normal work of breathing, no distress  Neuro: Alert and oriented x3 Skin: Warm, dry       Assessment & Plan:   See Encounters Tab for problem based charting.  Patient discussed with Dr. Heide SparkNarendra \

## 2017-10-31 NOTE — Patient Instructions (Addendum)
Nice to meet you today Larry Wiley.  We are not sure exactly what is causing your hearing loss.  It does not look like your ear is infected, and it does not sound like Mnire's disease like her sister.  On testing your hearing with the tuning fork, it is difficult to say whether it is a problem with the nerves in your ear or the bones in your ear.  We are going to send you to an ear doctor to see if they are able to provide more of an explanation.  In the meantime, try to avoid using Q-tips or putting other things in your ear.

## 2017-10-31 NOTE — Assessment & Plan Note (Addendum)
The pt presents with the sudden onset of R sided hearing loss with no inciting incident. His exam is not consistent with otitis media. Physical exam testing with the tuning fork using Weber and Rinne test are contradictory as Weber indicates sensorineural while Rinne indicates a conductive issue. No trauma noted obvious on exam today. He is not having any vertigo sx to suggest a process such as vestibular neuritis or Meniere's disease which was a concern of his family. At this point, will refer to ENT for further evaluation given the contradictory findings on exam and lack of clear diagnosis. Encouraged to avoid cotton swabs   --ENT referral- his sister has had a good experience with Dr. Ezzard StandingNewman

## 2017-11-01 NOTE — Progress Notes (Signed)
[facility] Attending  Case discussed with Dr. Harden at the time of the visit.  We reviewed the resident's history and exam and pertinent patient test results.  I agree with the assessment, diagnosis, and plan of care documented in the resident's note.  

## 2017-11-07 DIAGNOSIS — H90A31 Mixed conductive and sensorineural hearing loss, unilateral, right ear with restricted hearing on the contralateral side: Secondary | ICD-10-CM | POA: Diagnosis not present

## 2017-11-07 DIAGNOSIS — H90A22 Sensorineural hearing loss, unilateral, left ear, with restricted hearing on the contralateral side: Secondary | ICD-10-CM | POA: Diagnosis not present

## 2017-11-07 DIAGNOSIS — H9123 Sudden idiopathic hearing loss, bilateral: Secondary | ICD-10-CM | POA: Diagnosis not present

## 2017-11-09 ENCOUNTER — Other Ambulatory Visit: Payer: Self-pay

## 2017-11-09 ENCOUNTER — Encounter: Payer: Self-pay | Admitting: Internal Medicine

## 2017-11-09 ENCOUNTER — Ambulatory Visit (INDEPENDENT_AMBULATORY_CARE_PROVIDER_SITE_OTHER): Payer: Medicare Other | Admitting: Internal Medicine

## 2017-11-09 VITALS — BP 139/78 | HR 80 | Temp 97.3°F | Wt 146.9 lb

## 2017-11-09 DIAGNOSIS — L718 Other rosacea: Secondary | ICD-10-CM | POA: Diagnosis not present

## 2017-11-09 DIAGNOSIS — Z79899 Other long term (current) drug therapy: Secondary | ICD-10-CM

## 2017-11-09 DIAGNOSIS — I10 Essential (primary) hypertension: Secondary | ICD-10-CM

## 2017-11-09 DIAGNOSIS — E1129 Type 2 diabetes mellitus with other diabetic kidney complication: Secondary | ICD-10-CM | POA: Diagnosis present

## 2017-11-09 DIAGNOSIS — IMO0001 Reserved for inherently not codable concepts without codable children: Secondary | ICD-10-CM

## 2017-11-09 DIAGNOSIS — E785 Hyperlipidemia, unspecified: Secondary | ICD-10-CM | POA: Diagnosis not present

## 2017-11-09 DIAGNOSIS — Z7984 Long term (current) use of oral hypoglycemic drugs: Secondary | ICD-10-CM | POA: Diagnosis not present

## 2017-11-09 DIAGNOSIS — H9041 Sensorineural hearing loss, unilateral, right ear, with unrestricted hearing on the contralateral side: Secondary | ICD-10-CM

## 2017-11-09 DIAGNOSIS — R809 Proteinuria, unspecified: Secondary | ICD-10-CM

## 2017-11-09 DIAGNOSIS — E663 Overweight: Secondary | ICD-10-CM

## 2017-11-09 DIAGNOSIS — Z6826 Body mass index (BMI) 26.0-26.9, adult: Secondary | ICD-10-CM

## 2017-11-09 DIAGNOSIS — E7800 Pure hypercholesterolemia, unspecified: Secondary | ICD-10-CM

## 2017-11-09 LAB — GLUCOSE, CAPILLARY: Glucose-Capillary: 249 mg/dL — ABNORMAL HIGH (ref 65–99)

## 2017-11-09 LAB — POCT GLYCOSYLATED HEMOGLOBIN (HGB A1C): Hemoglobin A1C: 6.9

## 2017-11-09 MED ORDER — ASPIRIN EC 81 MG PO TBEC: 81.0000 mg | DELAYED_RELEASE_TABLET | Freq: Every day | ORAL | 0 refills | Status: DC

## 2017-11-09 NOTE — Progress Notes (Signed)
   Subjective:    Patient ID: Larry Wiley, male    DOB: Aug 01, 1966, 52 y.o.   MRN: [id number]  HPI  Larry Wiley is here for follow-up of his type 2 diabetes, essential hypertension, hyperlipidemia, acne rosacea, overweight status, and asymmetrical right sensorineural hearing loss. Please see the A&P for the status of the pt's chronic medical problems.  In mid December he developed the sudden onset of decreased hearing on the right. [facility] and referred to ENT. Although I do not have the notes from ENT, the family states that he developed bilateral, although asymmetric, sensorineural hearing loss. He was placed on a steroid burst with taper starting at 60 mg by mouth daily and tapering by 10 mg every day until off. Today's dose is 40 mg. He has yet to experience any improvement in his hearing on this therapy that was started the other day. He has noted a marked increase in his glucoses to 500 initially and now down in the mid 200s. Prior to the initiation of prednisone his blood sugars were much better. He is without other acute complaints  Review of Systems  Constitutional: Positive for appetite change. Negative for activity change, fatigue and unexpected weight change.       Marked increase in his appetite since starting the prednisone  Respiratory: Negative for cough, chest tightness, shortness of breath and wheezing.   Cardiovascular: Negative for chest pain, palpitations and leg swelling.  Gastrointestinal: Negative for abdominal pain, constipation, diarrhea, nausea and vomiting.  Musculoskeletal: Negative for arthralgias and myalgias.  Skin: Positive for rash. Negative for wound.  Psychiatric/Behavioral: Negative for agitation, behavioral problems, confusion and sleep disturbance. The patient is not nervous/anxious and is not hyperactive.       Objective:   Physical Exam  Constitutional: He is oriented to person, place, and time. He appears  well-developed and well-nourished. No distress.  HENT:  Head: Normocephalic and atraumatic.  Eyes: Conjunctivae are normal. Right eye exhibits no discharge. Left eye exhibits no discharge. No scleral icterus.  Cardiovascular: Normal rate, regular rhythm and normal heart sounds. Exam reveals no gallop and no friction rub.  No murmur heard. Pulmonary/Chest: Effort normal and breath sounds normal. No respiratory distress. He has no wheezes. He has no rales.  Abdominal: Soft. Bowel sounds are normal. He exhibits no distension. There is no tenderness. There is no rebound and no guarding.  Musculoskeletal: Normal range of motion. He exhibits no edema, tenderness or deformity.  Neurological: He is alert and oriented to person, place, and time. He exhibits normal muscle tone.  Skin: Skin is warm and dry. Rash noted. He is not diaphoretic. There is erythema.  Rosacea rash slightly worse than last visit  Psychiatric: He has a normal mood and affect. His behavior is normal. Judgment and thought content normal.  Nursing note and vitals reviewed.     Assessment & Plan:   Please see problem oriented charting.

## 2017-11-09 NOTE — Patient Instructions (Signed)
You are doing great.  I am so sorry about your hearing loss.  1) Keep taking your medications as you are.  I will see you back in 4 months, sooner if necessary.

## 2017-11-09 NOTE — Assessment & Plan Note (Signed)
Assessment  He is tolerating the atorvastatin at 40 mg by mouth daily without myalgias.  Plan  We will continue this high intensity statin and reassess for intolerances at the follow-up visit.

## 2017-11-09 NOTE — Assessment & Plan Note (Signed)
Assessment  He was seen in ENT by Dr. Ezzard StandingNewman who prescribed a steroid burst with taper. This was begun 3 days ago and he has yet to perceive an improvement in his hearing loss.  Plan  He will continue with the steroid burst with taper and is scheduled for follow-up in ENT on January 30. At the follow-up visit we will reassess the success of this therapy in restoring his right-sided hearing.

## 2017-11-09 NOTE — Assessment & Plan Note (Signed)
Assessment  His weight today was 146 pounds up from 145 pounds 3 months ago. This is despite several days of high-dose prednisone with a voracious appetite as a result. His sisters state they have been very careful and limiting the sweets that are brought into the house and that he has access to. They were praised for the stability in his weight.  Plan  He was encouraged to continue with his current lifestyle modifications and continue to work on weight loss which will help both his blood pressure and his diabetes.

## 2017-11-09 NOTE — Assessment & Plan Note (Signed)
Assessment  His blood pressure today was 139/78 despite the high-dose prednisone. This is on metoprolol XL 12.5 mg by mouth daily and hydrochlorothiazide 12.5 mg by mouth daily.  Plan  We will continue the metoprolol XL 12.5 mg by mouth daily and hydrochlorothiazide 12.5 mg by mouth daily. We will reassess the efficacy of this therapy in controlling his blood pressure at the follow-up visit.

## 2017-11-09 NOTE — Assessment & Plan Note (Signed)
Assessment  He's had a slight exacerbation of his pustular type acne rosacea. He has continued to take the metronidazole 0.75% cream daily but has done so on an as-needed basis.  Plan  We will continue with the metronidazole 0.75% cream daily and reinstitute it more consistently, at least acutely, so that we can get a better handle on the most recent exacerbation. We will reassess the efficacy of this therapy at the follow-up visit.

## 2017-11-09 NOTE — Assessment & Plan Note (Addendum)
Assessment  His hemoglobin A1c today is 6.9 which is improved from 7.2 three months ago. This is on Glucotrol XL 10 mg twice daily, metformin 1000 mg twice daily, and Januvia 100 mg by mouth daily. His most recent worsening is all related to the high-dose prednisone for his sudden sensorineural hearing loss. Review of his Accu-Chek report reveals 8 tests with an average of 249 although half of these were after starting the prednisone. He had no hypoglycemic episodes.  His last microalbumin was checked just over 3 months ago, and therefore was not repeated.  Plan  We will continue the Glucotrol XL, metformin, and Januvia at the current doses and reassess the efficacy in controlling his diabetes with a repeat hemoglobin A1c at the follow-up visit in 4 months. We decided against 3 months because of this short steroid burst with taper that well obviously throw off the hemoglobin A1c if checked sooner.

## 2017-11-09 NOTE — Assessment & Plan Note (Signed)
His family has decided against colon cancer screening. He was due for the Prevnar 13 vaccination today but because of the high dose steroids for his acute sensorineural hearing loss we will defer this to the next visit. He is otherwise up-to-date on his health care maintenance.

## 2017-11-21 DIAGNOSIS — H9121 Sudden idiopathic hearing loss, right ear: Secondary | ICD-10-CM | POA: Diagnosis not present

## 2017-11-21 DIAGNOSIS — H9123 Sudden idiopathic hearing loss, bilateral: Secondary | ICD-10-CM | POA: Diagnosis not present

## 2017-12-05 DIAGNOSIS — H903 Sensorineural hearing loss, bilateral: Secondary | ICD-10-CM | POA: Diagnosis not present

## 2017-12-05 DIAGNOSIS — H9123 Sudden idiopathic hearing loss, bilateral: Secondary | ICD-10-CM | POA: Diagnosis not present

## 2018-01-22 ENCOUNTER — Encounter: Payer: Self-pay | Admitting: *Deleted

## 2018-02-08 ENCOUNTER — Other Ambulatory Visit: Payer: Self-pay | Admitting: Internal Medicine

## 2018-02-08 DIAGNOSIS — R809 Proteinuria, unspecified: Secondary | ICD-10-CM

## 2018-02-08 DIAGNOSIS — I10 Essential (primary) hypertension: Secondary | ICD-10-CM

## 2018-02-08 DIAGNOSIS — E1129 Type 2 diabetes mellitus with other diabetic kidney complication: Secondary | ICD-10-CM

## 2018-03-21 ENCOUNTER — Ambulatory Visit (INDEPENDENT_AMBULATORY_CARE_PROVIDER_SITE_OTHER): Payer: Medicare Other | Admitting: Internal Medicine

## 2018-03-21 ENCOUNTER — Encounter: Payer: Self-pay | Admitting: Internal Medicine

## 2018-03-21 ENCOUNTER — Other Ambulatory Visit: Payer: Self-pay

## 2018-03-21 VITALS — BP 128/73 | HR 83 | Temp 98.2°F | Wt 148.7 lb

## 2018-03-21 DIAGNOSIS — E663 Overweight: Secondary | ICD-10-CM

## 2018-03-21 DIAGNOSIS — Z23 Encounter for immunization: Secondary | ICD-10-CM | POA: Diagnosis not present

## 2018-03-21 DIAGNOSIS — H9041 Sensorineural hearing loss, unilateral, right ear, with unrestricted hearing on the contralateral side: Secondary | ICD-10-CM | POA: Diagnosis not present

## 2018-03-21 DIAGNOSIS — E7800 Pure hypercholesterolemia, unspecified: Secondary | ICD-10-CM

## 2018-03-21 DIAGNOSIS — I10 Essential (primary) hypertension: Secondary | ICD-10-CM

## 2018-03-21 DIAGNOSIS — E785 Hyperlipidemia, unspecified: Secondary | ICD-10-CM | POA: Diagnosis not present

## 2018-03-21 DIAGNOSIS — E1129 Type 2 diabetes mellitus with other diabetic kidney complication: Secondary | ICD-10-CM

## 2018-03-21 DIAGNOSIS — R809 Proteinuria, unspecified: Secondary | ICD-10-CM

## 2018-03-21 DIAGNOSIS — L718 Other rosacea: Secondary | ICD-10-CM | POA: Diagnosis not present

## 2018-03-21 DIAGNOSIS — IMO0001 Reserved for inherently not codable concepts without codable children: Secondary | ICD-10-CM

## 2018-03-21 LAB — POCT GLYCOSYLATED HEMOGLOBIN (HGB A1C): Hemoglobin A1C: 7.4 % — AB (ref 4.0–5.6)

## 2018-03-21 LAB — GLUCOSE, CAPILLARY: Glucose-Capillary: 182 mg/dL — ABNORMAL HIGH (ref 65–99)

## 2018-03-21 NOTE — Assessment & Plan Note (Signed)
Assessment  He put on 2 pounds in the last 4 months, most of this likely related to the high-dose steroids that were used temporarily for his sensorineural hearing loss. He is committed to be more active now that the weather has improved. He has also been experimenting more with vegetables and really likes asparagus.  Plan  He was encouraged to continue to walk on a daily basis with his family and eat more vegetables. I praised him for these changes in his lifestyle we will reassess the efficacy of these lifestyle changes on his weight at the follow-up visit.

## 2018-03-21 NOTE — Patient Instructions (Signed)
You always make my day when I get to see you.  1) Keep taking the medications as you are.  2) Keep working on your weight with walking and eating those vegetables.  3) We gave you a pneumonia shot today.  I will see you back in 6 months, sooner if necessary.

## 2018-03-21 NOTE — Assessment & Plan Note (Signed)
Assessment  He is tolerating the atorvastatin 40 mg by mouth daily without myalgias.  Plan  We will continue with this high intensity statin and reassess for intolerances at the follow-up visit. 

## 2018-03-21 NOTE — Assessment & Plan Note (Signed)
Assessment  His blood pressure is at target today at 128/73. This is on hydrochlorothiazide 12.5 mg by mouth every morning and Toprol-XL 12.5 mg by mouth daily.  Plan  We will continue the hydrochlorothiazide at 12.5 mg by mouth every morning and Toprol-XL at 12.5 mg by mouth daily. We will reassess efficacy of this therapy at the follow-up visit.

## 2018-03-21 NOTE — Assessment & Plan Note (Signed)
He was given a prevnar 13 vaccination today. This was noted to be needed in his overdue health maintenance tab. Unfortunately, after reviewing the chart through another tab after the vaccination was given it appears he may already receive the Prevnar 13. We will report this in hopes of fixing the electronic medical record and avoiding future unnecessary vaccinations. He is otherwise up-to-date in his health care maintenance.

## 2018-03-21 NOTE — Progress Notes (Signed)
72

## 2018-03-21 NOTE — Assessment & Plan Note (Signed)
Assessment  His diabetes is less well controlled today with a hemoglobin A1c of 7.4 up from 6.9 approximately 4 months ago. Review of his blood glucose log demonstrates 12 tests with an average of 179, the highest being 232 and the lowest being 131. A third of the readings were above range and two thirds were within range. He stopped the high-dose steroids several months ago so I do not believe much of this is related to the steroids for sensorineural hearing loss. Some of the decreased control is related to his 2 pound weight gain in the last 4 months and the decrease in activity he has had although he is now starting to walk every evening with his family. In addition, he has been more curious about other vegetables and has been eating broccoli and asparagus more than in the past. In fact, he has a real liking for the asparagus which may allow for more vegetable eating and a decrease in carbs.  Plan  We will continue his current regimen which includes metformin 1000 mg by mouth twice daily, glipizide XL 10 mg by mouth twice daily, and Januvia 100 mg by mouth daily.  His sisters would like an opportunity to improve his hemoglobin A1c using lifestyle modifications prior to adding a fourth agent. They are not interested in injection therapy at this time. We will reassess efficacy of his current antihyperglycemic regimen as well as the lifestyle modifications he continues to pursue at the follow-up visit with a repeat hemoglobin A1c.

## 2018-03-21 NOTE — Assessment & Plan Note (Signed)
Assessment  His pustular type acne rosacea looks better today than I've seen in a while. He has been using the metronidazole cream 0.75% daily.  Plan  He will continue to use the metronidazole cream 0.75% daily as needed for his acne rosacea. We will reassess the efficacy of this therapy at the follow-up visit.

## 2018-03-21 NOTE — Assessment & Plan Note (Signed)
Assessment  Unfortunately, his asymmetric sensorineural hearing loss did not respond to high-dose steroids. His sisters report that a recent hearing test demonstrated no improvement and that he may be a candidate for hearing aid. Unfortunately these are quite expensive and may be out of his reach.  Plan  He will continue to follow with ENT. They are assessing for options with regards to obtaining a hearing aid. We will follow the progress at the return visit.

## 2018-03-21 NOTE — Progress Notes (Signed)
   Subjective:    Patient ID: Larry Wiley, male    DOB: Feb 27, 1966, 52 y.o.   MRN: [id number]  HPI  Larry Wiley is here for follow-up of his type II diabetes, asymmetrical sensorineural hearing loss, hyperlipidemia, essential hypertension, acne rosacea, and overweight status. Please see the A&P for the status of the pt's chronic medical problems.  Review of Systems  Constitutional: Negative for activity change, appetite change and unexpected weight change.  Respiratory: Negative for chest tightness, shortness of breath and wheezing.   Cardiovascular: Negative for chest pain, palpitations and leg swelling.  Gastrointestinal: Negative for abdominal pain, constipation, diarrhea, nausea and vomiting.  Genitourinary: Negative for difficulty urinating.  Musculoskeletal: Negative for arthralgias, back pain, gait problem and myalgias.  Skin: Positive for rash.  Neurological: Negative for dizziness, weakness and light-headedness.      Objective:   Physical Exam  Constitutional: He is oriented to person, place, and time. He appears well-developed and well-nourished. No distress.  HENT:  Head: Atraumatic.  Eyes: Conjunctivae are normal. Right eye exhibits no discharge. Left eye exhibits no discharge. No scleral icterus.  Cardiovascular: Normal rate, regular rhythm and normal heart sounds. Exam reveals no gallop and no friction rub.  No murmur heard. Pulmonary/Chest: Effort normal and breath sounds normal. No stridor. No respiratory distress. He has no wheezes. He has no rales.  Abdominal: Soft. Bowel sounds are normal. He exhibits no distension. There is no tenderness. There is no rebound and no guarding.  Musculoskeletal: Normal range of motion. He exhibits no edema, tenderness or deformity.  Neurological: He is alert and oriented to person, place, and time. He exhibits normal muscle tone.  Skin: Skin is warm and dry. Rash noted. He is not diaphoretic.  Acne rosacea rash looks better than  usual today  Psychiatric: He has a normal mood and affect. His behavior is normal. Judgment and thought content normal.  Nursing note and vitals reviewed.     Assessment & Plan:   Please see problem oriented charting.

## 2018-03-22 ENCOUNTER — Encounter: Payer: Self-pay | Admitting: Internal Medicine

## 2018-05-31 ENCOUNTER — Other Ambulatory Visit: Payer: Self-pay | Admitting: Internal Medicine

## 2018-05-31 DIAGNOSIS — I10 Essential (primary) hypertension: Secondary | ICD-10-CM

## 2018-06-06 ENCOUNTER — Other Ambulatory Visit: Payer: Self-pay | Admitting: Internal Medicine

## 2018-06-06 DIAGNOSIS — E785 Hyperlipidemia, unspecified: Secondary | ICD-10-CM

## 2018-06-06 DIAGNOSIS — E1129 Type 2 diabetes mellitus with other diabetic kidney complication: Secondary | ICD-10-CM

## 2018-06-06 DIAGNOSIS — R809 Proteinuria, unspecified: Secondary | ICD-10-CM

## 2018-06-19 ENCOUNTER — Encounter: Payer: Self-pay | Admitting: Internal Medicine

## 2018-06-19 ENCOUNTER — Other Ambulatory Visit: Payer: Self-pay

## 2018-06-19 ENCOUNTER — Ambulatory Visit (INDEPENDENT_AMBULATORY_CARE_PROVIDER_SITE_OTHER): Payer: Medicare Other | Admitting: Internal Medicine

## 2018-06-19 VITALS — BP 129/72 | HR 95 | Temp 98.2°F | Wt 145.0 lb

## 2018-06-19 DIAGNOSIS — E785 Hyperlipidemia, unspecified: Secondary | ICD-10-CM

## 2018-06-19 DIAGNOSIS — R809 Proteinuria, unspecified: Secondary | ICD-10-CM

## 2018-06-19 DIAGNOSIS — R200 Anesthesia of skin: Secondary | ICD-10-CM | POA: Diagnosis not present

## 2018-06-19 DIAGNOSIS — T783XXA Angioneurotic edema, initial encounter: Secondary | ICD-10-CM

## 2018-06-19 DIAGNOSIS — E119 Type 2 diabetes mellitus without complications: Secondary | ICD-10-CM | POA: Diagnosis not present

## 2018-06-19 DIAGNOSIS — K219 Gastro-esophageal reflux disease without esophagitis: Secondary | ICD-10-CM

## 2018-06-19 DIAGNOSIS — I10 Essential (primary) hypertension: Secondary | ICD-10-CM

## 2018-06-19 DIAGNOSIS — R220 Localized swelling, mass and lump, head: Secondary | ICD-10-CM | POA: Diagnosis not present

## 2018-06-19 DIAGNOSIS — E1129 Type 2 diabetes mellitus with other diabetic kidney complication: Secondary | ICD-10-CM

## 2018-06-19 DIAGNOSIS — L719 Rosacea, unspecified: Secondary | ICD-10-CM | POA: Diagnosis not present

## 2018-06-19 DIAGNOSIS — H905 Unspecified sensorineural hearing loss: Secondary | ICD-10-CM | POA: Diagnosis not present

## 2018-06-19 HISTORY — DX: Angioneurotic edema, initial encounter: T78.3XXA

## 2018-06-19 LAB — GLUCOSE, CAPILLARY: Glucose-Capillary: 176 mg/dL — ABNORMAL HIGH (ref 70–99)

## 2018-06-19 NOTE — Progress Notes (Signed)
   CC: Mouth swelling   HPI:  Larry Wiley is a 52 y.o. with a PMHx of HTN, HLD, type II DM, GERD, sensorineural hearing loss, acne rosacea presenting with right sided mouth swelling and numbness for one day.  For details of today's visit and the status of his medical issues please refer to the assessment and plan.  Past Medical History:  Diagnosis Date  . Acne rosacea, papular type 02/25/2016  . Bilateral inguinal hernia   . Essential hypertension 08/18/2006  . Gastroesophageal reflux disease 09/27/2012   Intermittent symptoms, does not want therapy   . Hyperlipidemia LDL goal < 100 05/03/2007  . Mental retardation 08/18/2006  . Open-angle glaucoma 07/25/2013  . Overweight (BMI 25.0-29.9) 09/27/2012  . Type 2 diabetes mellitus with proteinuria or microalbuminuria 08/18/2006   Review of Systems:    Review of Systems  Constitutional: Negative for chills and fever.  Respiratory: Negative for cough, shortness of breath and wheezing.   Cardiovascular: Negative for chest pain.  Gastrointestinal: Negative for abdominal pain, nausea and vomiting.  Skin: Negative for rash.  Neurological: Positive for sensory change (right sided mouth numbness). Negative for speech change and weakness.    Physical Exam:  Vitals:   06/19/18 1541  BP: 129/72  Pulse: 95  Temp: 98.2 F (36.8 C)  TempSrc: Oral  SpO2: 97%  Weight: 145 lb (65.8 kg)   Physical Exam  Constitutional: No distress.  HENT:  Right sided mouth swelling, no abrasions, pus or drainage   Cardiovascular: Normal rate, regular rhythm and normal heart sounds.  No murmur heard. Pulmonary/Chest: Effort normal and breath sounds normal. No respiratory distress. He has no wheezes.  Neurological: No cranial nerve deficit.  Skin: Skin is warm and dry. He is not diaphoretic.      Assessment & Plan:   See Encounters Tab for problem based charting.  Patient seen with Dr. Rogelia BogaButcher

## 2018-06-19 NOTE — Patient Instructions (Signed)
Mr. Duva,  Continue to take benadryl for your mouth swelling for 1-2 more days as needed for the swelling. Please come to the ED immediately if you notice any trouble breathing or swallowing.  Thank you for allowing [place] to be a part of your care!    Anaphylactic Reaction An anaphylactic reaction (anaphylaxis) is a sudden allergic reaction that is very bad (severe). It also affects more than one part of the body. This condition can be life-threatening. If you have an anaphylactic reaction, you need to get medical help right away. You may need to stay in the hospital. Your doctor may teach you how to use an allergy kit (anaphylaxis kit) and how to give yourself an allergy shot (epinephrine injection). You can give yourself an allergy shot with what is commonly called an auto-injector "pen." Symptoms of an anaphylactic reaction may include:  A stuffy nose (nasal congestion).  Headache.  Tingling in your mouth.  A flushed face.  An itchy, red rash.  Swelling of your eyes, lips, face, or tongue.  Swelling of the back of your mouth and your throat.  Breathing loudly (wheezing).  A hoarse voice.  Itchy, red, swollen areas of skin (hives).  Dizziness or light-headedness.  Passing out (fainting).  Feeling worried or nervous (anxiety).  Feeling confused.  Pain in your belly (abdomen) or chest.  Trouble with breathing, talking, or swallowing.  A tight feeling in your chest or throat.  Fast or uneven heartbeats (palpitations).  Throwing up (vomiting).  Watery poop (diarrhea).  Follow these instructions at home: Safety  Always keep an auto-injector pen or your allergy kit with you. These could save your life. Use them as told by your doctor.  Do not drive until your doctor says that it is safe.  Make sure that you, the people who live with you, and your employer know: ? How to use your allergy kit. ? How to use an auto-injector pen to give you an allergy shot.  If  you used your auto-injector pen: ? Get more medicine for it right away. This is important in case you have another reaction. ? Get help right away.  Wear a bracelet or necklace that says you have an allergy, if your doctor tells you to do this.  Learn the signs of a very bad allergic reaction.  Work with your doctors to make a plan for what to do if you have a very bad allergic reaction. Being prepared is important. General instructions  Take over-the-counter and prescription medicines only as told by your doctor.  If you have itchy, red, swollen areas of skin or a rash: ? Use over-the-counter medicine (antihistamine) as told by your doctor. ? Put cold, wet cloths (cold compresses) on your skin. ? Take baths or showers in cool water. Avoid hot water.  If you had tests done, it is up to you to get your test results. Ask your doctor when your results will be ready.  Tell any doctors who care for you that you have an allergy.  Keep all follow-up visits as told by your doctor. This is important. How is this prevented?  Avoid things (allergens) that gave you a very bad allergic reaction before.  If you have a food allergy and you go to a restaurant, tell your server about your allergy. If you are not sure if your meal was made with food that you are allergic to, ask your server before you eat it. Contact a doctor if:  You have  symptoms of an allergic reaction. You may notice them soon after being around whatever it is that you are allergic to. Symptoms may include: ? A rash. ? A headache. ? Sneezing or a runny nose. ? Swelling. ? Feeling sick to your stomach. ? Watery poop. Get help right away if:  You had to use your auto-injector pen. You must go to the emergency room even if the medicine seems to be working.  You have any of these: ? A tight feeling in your chest or your throat. ? Loud breathing. ? Trouble with breathing. ? Itchy, red, swollen areas of skin. ? Red skin or  itching all over your body. ? Swelling in your lips, tongue, or the back of your throat.  You have throwing up that gets very bad.  You have watery poop that gets very bad.  You pass out or feel like you might pass out. These symptoms may be an emergency. Do not wait to see if the symptoms will go away. Use your auto-injector pen or allergy kit as you have been told. Get medical help right away. Call your local emergency services (911 in the [place] Do not drive yourself to the hospital. Summary  An anaphylactic reaction (anaphylaxis) is a sudden allergic reaction that is very bad (severe).  This condition can be life-threatening. If you have an anaphylactic reaction, you need to get medical help right away.  Your doctor may teach you how to use an allergy kit (anaphylaxis kit) and how to give yourself an allergy shot (epinephrine injection) with an auto-injector "pen."  Always keep an auto-injector pen or your allergy kit with you. These could save your life. Use them as told by your doctor.  If you had to use your auto-injector pen, you must go to the emergency room even if the medicine seems to be working. This information is not intended to replace advice given to you by your health care provider. Make sure you discuss any questions you have with your health care provider. Document Released: 03/27/2008 Document Revised: 06/02/2016 Document Reviewed: 06/02/2016 Elsevier Interactive Patient Education  2017 [employer].

## 2018-06-19 NOTE — Assessment & Plan Note (Addendum)
Patient reported right sided mouth swelling and numbness for one day. He noted the swelling last night after eating orange chicken stir fry and popcorn, both of which he has had before. He also ate poptarts earlier in the day. He denied any pain, trouble breathing, trouble swallowing, fevers, rashes, nausea or vomiting. His sisters noted the swelling this morning and gave him some benadryl which decreased the swelling. He reported one similar episode ~3 years ago after starting an ACE inhibitor. He does not know of any other allergies. He has had no changes to his medications or supplements. No s/sx of TIA, cranial nerves and sensation intact. No recent illnesses, sick contacts or dental procedures. Most likely an allergic reaction to an unknown substance. Recommend continuing benadryl and further work up if allergic reactions continue or ED if he has any s/sx of troubled breathing/swallowing.  -continue benadryl for 1-2 days -go to the ED if any s/sx of trouble breathing/swalloing

## 2018-06-21 NOTE — Progress Notes (Signed)
Internal Medicine Clinic Attending  I saw and evaluated the patient.  I personally confirmed the key portions of the history and exam documented by Dr.  Rehman  and I reviewed pertinent patient test results.  The assessment, diagnosis, and plan were formulated together and I agree with the documentation in the resident's note.  

## 2018-07-18 ENCOUNTER — Ambulatory Visit (INDEPENDENT_AMBULATORY_CARE_PROVIDER_SITE_OTHER): Payer: Medicare Other | Admitting: *Deleted

## 2018-07-18 DIAGNOSIS — Z23 Encounter for immunization: Secondary | ICD-10-CM | POA: Diagnosis not present

## 2018-07-31 ENCOUNTER — Telehealth: Payer: Self-pay | Admitting: *Deleted

## 2018-07-31 ENCOUNTER — Encounter (HOSPITAL_COMMUNITY): Payer: Self-pay | Admitting: *Deleted

## 2018-07-31 ENCOUNTER — Emergency Department (HOSPITAL_COMMUNITY)
Admission: EM | Admit: 2018-07-31 | Discharge: 2018-07-31 | Disposition: A | Discharge status: Home / Self Care | Payer: Medicare Other | Attending: Emergency Medicine | Admitting: Emergency Medicine

## 2018-07-31 ENCOUNTER — Other Ambulatory Visit: Payer: Self-pay

## 2018-07-31 DIAGNOSIS — T783XXA Angioneurotic edema, initial encounter: Secondary | ICD-10-CM | POA: Diagnosis not present

## 2018-07-31 DIAGNOSIS — E119 Type 2 diabetes mellitus without complications: Secondary | ICD-10-CM | POA: Insufficient documentation

## 2018-07-31 DIAGNOSIS — Z79899 Other long term (current) drug therapy: Secondary | ICD-10-CM | POA: Insufficient documentation

## 2018-07-31 DIAGNOSIS — Z7982 Long term (current) use of aspirin: Secondary | ICD-10-CM | POA: Diagnosis not present

## 2018-07-31 DIAGNOSIS — Z7984 Long term (current) use of oral hypoglycemic drugs: Secondary | ICD-10-CM | POA: Insufficient documentation

## 2018-07-31 MED ORDER — DIPHENHYDRAMINE HCL 25 MG PO CAPS
25.0000 mg | ORAL_CAPSULE | Freq: Once | ORAL | Status: AC
  Administered 2018-07-31: 25 mg via ORAL
  Filled 2018-07-31: qty 1

## 2018-07-31 MED ORDER — EPINEPHRINE 0.3 MG/0.3ML IJ SOAJ: 0.3000 mg | INTRAMUSCULAR | 0 refills | Status: DC | PRN

## 2018-07-31 NOTE — Telephone Encounter (Signed)
Pt in ED at 1113

## 2018-07-31 NOTE — Telephone Encounter (Signed)
Thank you Helen.. I agree 

## 2018-07-31 NOTE — ED Provider Notes (Signed)
[facility] EMERGENCY DEPARTMENT Provider Note   CSN: [id number] Arrival date & time: 07/31/18  1113     History   Chief Complaint Chief Complaint  Patient presents with  . Facial Swelling    HPI Larry Wiley is a 52 y.o. male with HTN, DM type 2, GERD who presents for evaluation of facial swelling. Family is at bedside assists with historical narrative.  After eating breakfast this morning, the right half of his bottom lip began to swell. He took benadryl with mild improvement but came to the ED because the swelling was still significant. He denies associated chest pain, sob, wheezing, cough, diarrhea, abdominal pain. He does endorse pruritis over his abdomen.   Family states that he's had episodes like these weekly for the past several months. They involve is lips and respond to benadryl. This episode was more severe than prior. Denies involvement of tongue or throat. Has history of angioedema from ACEi but ACEi was discontinued several years ago with resolution of symptoms. Denies food or environmental injuries.   ROS negative for fevers, chills, diarrhea, chest pain, sob, cough, recent illness or sick contacts.    HPI  Past Medical History:  Diagnosis Date  . Acne rosacea, papular type 02/25/2016  . Bilateral inguinal hernia   . Essential hypertension 08/18/2006  . Gastroesophageal reflux disease 09/27/2012   Intermittent symptoms, does not want therapy   . Hyperlipidemia LDL goal < 100 05/03/2007  . Mental retardation 08/18/2006  . Open-angle glaucoma 07/25/2013  . Overweight (BMI 25.0-29.9) 09/27/2012  . Type 2 diabetes mellitus with proteinuria or microalbuminuria 08/18/2006    Patient Active Problem List   Diagnosis Date Noted  . Mouth swelling 06/19/2018  . Asymmetrical right sensorineural hearing loss 10/31/2017  . Acne rosacea, papular type 02/25/2016  . Open-angle glaucoma 07/25/2013  . Gastroesophageal reflux disease 09/27/2012  . Overweight  (BMI 25.0-29.9) 09/27/2012  . Healthcare maintenance 09/27/2012  . Hyperlipidemia 05/03/2007  . Type 2 diabetes mellitus with microalbuminuria, without long-term current use of insulin ([place]) 08/18/2006  . Mental retardation 08/18/2006  . Essential hypertension 08/18/2006    History reviewed. No pertinent surgical history.      Home Medications    Prior to Admission medications   Medication Sig Start Date End Date Taking? Authorizing Provider  ACCU-CHEK FASTCLIX LANCETS MISC check blood sugar up to 1 time a day as instructed 10/02/16   Oval Linsey, MD  aspirin EC 81 MG tablet Take 1 tablet (81 mg total) by mouth daily. 11/09/17 11/09/18  Oval Linsey, MD  atorvastatin (LIPITOR) 40 MG tablet Take 1 tablet (40 mg total) by mouth daily. 06/06/18   Oval Linsey, MD  Blood Glucose Monitoring Suppl (ACCU-CHEK GUIDE) w/Device KIT 1 each by Does not apply route daily. check blood sugar up to 1 time a day as instructed. 10/02/16   Oval Linsey, MD  EPINEPHrine 0.3 mg/0.3 mL IJ SOAJ injection Inject 0.3 mLs (0.3 mg total) into the muscle as needed (for difficulty breathing). 07/31/18   Isabelle Course, MD  glipiZIDE (GLUCOTROL XL) 10 MG 24 hr tablet Take 1 tablet (10 mg total) by mouth 2 (two) times daily. 06/06/18   Oval Linsey, MD  glucose blood (ACCU-CHEK GUIDE) test strip check blood sugar up to 1 time a day as instructed 10/02/16   Oval Linsey, MD  hydrochlorothiazide (MICROZIDE) 12.5 MG capsule Take 1 capsule (12.5 mg total) by mouth daily. 02/11/18   Oval Linsey, MD  latanoprost (XALATAN) 0.005 %  ophthalmic solution Place 1 drop into both eyes at bedtime. 07/27/17 07/27/18  Oval Linsey, MD  metFORMIN (GLUCOPHAGE) 1000 MG tablet Take 1 tablet (1,000 mg total) by mouth 2 (two) times daily with a meal. 02/11/18   Oval Linsey, MD  metoprolol succinate (TOPROL-XL) 25 MG 24 hr tablet Take 0.5 tablets (12.5 mg total) by mouth daily. 06/04/18   Oval Linsey, MD  metroNIDAZOLE  (METROCREAM) 0.75 % cream Apply topically daily. Apply only to the rash 02/25/16   Oval Linsey, MD  sitaGLIPtin (JANUVIA) 100 MG tablet Take 1 tablet (100 mg total) by mouth daily. 10/01/17   Oval Linsey, MD    Family History Family History  Problem Relation Age of Onset  . Hyperlipidemia Mother   . Hypertension Mother   . Liver cancer Mother   . Diabetes Father   . Heart attack Father     Social History Social History   Tobacco Use  . Smoking status: Never Smoker  . Smokeless tobacco: Never Used  Substance Use Topics  . Alcohol use: No    Alcohol/week: 0.0 standard drinks  . Drug use: No     Allergies   Ace inhibitors; Amoxicillin; and Amoxicillin-pot clavulanate   Review of Systems Review of Systems See HPI  Physical Exam Updated Vital Signs BP 121/77 (BP Location: Right Arm)   Pulse 94   Temp 98.2 F (36.8 C) (Oral)   Resp 16   SpO2 98%   Physical Exam Vitals:   07/31/18 1121 07/31/18 1303 07/31/18 1500  BP: (!) 142/82 (!) 141/77 121/77  Pulse: (!) 102 94 94  Resp: '20 16 16  ' Temp: 98.2 F (36.8 C)    TempSrc: Oral    SpO2: 100% 98% 98%   General: Vital signs reviewed.  Patient is well-developed and well-nourished, in no acute distress and cooperative with exam.  Head: Normocephalic and atraumatic. Eyes: EOMI, conjunctivae normal, chronic left eye lid drooping   Mouth: slight swelling of right lower lip. Oropharynx is non erythematous without exudate or obstruction. No swelling of tongue.  Neck: Supple, trachea midline, normal ROM Cardiovascular: RRR, S1 normal, S2 normal, no murmurs, gallops, or rubs. Pulmonary/Chest: Clear to auscultation bilaterally, no wheezes, rales, or rhonchi. Abdominal: Soft, non-tender, non-distended Musculoskeletal: No joint deformities, erythema, or stiffness, ROM full and nontender. Skin: Warm, dry and intact. Scattered, pruruitic, well demarcated, erythematous wheals on abdomen.    ED Treatments / Results   Labs (all labs ordered are listed, but only abnormal results are displayed) Labs Reviewed - No data to display  EKG None  Radiology No results found.  Procedures Procedures (including critical care time)  Medications Ordered in ED Medications  diphenhydrAMINE (BENADRYL) capsule 25 mg (25 mg Oral Given 07/31/18 1350)     Initial Impression / Assessment and Plan / ED Course  I have reviewed the triage vital signs and the nursing notes.  Pertinent labs & imaging results that were available during my care of the patient were reviewed by me and considered in my medical decision making (see chart for details).     Hemodynamically stable without signs or symptoms of respiratory distress. Has pruritis and urticaria on abdomen. History of angioedema to ACEi but ACEi was discontinued several years ago with resolution of symptoms. No family history of angioedema. No signs or symptoms of underlying infection. Most likely allergic angioedema. Denies recent medication changes, new foods, detergents or pets and denies insect sting. Less likely hereditary angioedema due to lack of family history. Could be  acquired angioedema but doubtful since symptoms respond to antihistamines.   Will treat with benadryl and monitor. Discharge with follow up in Allergy and Immunology. Given prescription for epi pen and instructed to start daily cetirizine.   Patient was monitored and did not experience worsening of symptoms or respiratory distress. He is safe to discharge.   Final Clinical Impressions(s) / ED Diagnoses   Final diagnoses:  Allergic angioedema, initial encounter    ED Discharge Orders         Ordered    EPINEPHrine 0.3 mg/0.3 mL IJ SOAJ injection  As needed     07/31/18 1353    Ambulatory referral to Allergy    Comments:  Allergic angioedema   07/31/18 1435           Isabelle Course, MD 07/31/18 4753    Jola Schmidt, MD 07/31/18 [phone]

## 2018-07-31 NOTE — Telephone Encounter (Signed)
Called ED spoke w/ karen, chg rn, made her aware

## 2018-07-31 NOTE — Telephone Encounter (Signed)
Pt's sister calls and states pt is having "that swelling of face hes had before", ask for her to explain, she states we just gave him a benadryl. She states his face is getting swollen, his lip is swelling. She is ask to call 911, she states she will bring him to ED they are coming to [facility]. Will call ED Chg Nurse to make aware

## 2018-07-31 NOTE — ED Triage Notes (Signed)
Pt in with family c/o facial swelling that first started this morning, lower lip swelling noted, pt has been having episodes like this for the last several months that seem to be getting progressively worse, took benadryl this am, no airway swelling, denies shortness of breath

## 2018-07-31 NOTE — Discharge Instructions (Addendum)
Please start taking a daily allergy medicine such as Claritin or Zyrtec. You can buy this medicine over the counter and the generic formulation is fine to use. I have sent a referral to the Allergy Specialist. Please call their office to schedule an appointment for allergy testing.   If you experience another episode of swelling, take 25mg  benadryl. If symptoms do not improve, take another 25mg  benadryl. If you start to experience shortness of breath, difficulty breathing, chest pain, cough, wheezing, immediately administer the Epi pen as prescribed and go to the emergency department immediately.

## 2018-08-23 ENCOUNTER — Ambulatory Visit (INDEPENDENT_AMBULATORY_CARE_PROVIDER_SITE_OTHER): Payer: Medicare Other | Admitting: Internal Medicine

## 2018-08-23 ENCOUNTER — Encounter: Payer: Self-pay | Admitting: Internal Medicine

## 2018-08-23 VITALS — BP 132/73 | HR 80 | Temp 98.2°F | Wt 145.1 lb

## 2018-08-23 DIAGNOSIS — E663 Overweight: Secondary | ICD-10-CM | POA: Diagnosis not present

## 2018-08-23 DIAGNOSIS — E1129 Type 2 diabetes mellitus with other diabetic kidney complication: Secondary | ICD-10-CM

## 2018-08-23 DIAGNOSIS — I10 Essential (primary) hypertension: Secondary | ICD-10-CM

## 2018-08-23 DIAGNOSIS — R809 Proteinuria, unspecified: Secondary | ICD-10-CM | POA: Diagnosis not present

## 2018-08-23 DIAGNOSIS — H9041 Sensorineural hearing loss, unilateral, right ear, with unrestricted hearing on the contralateral side: Secondary | ICD-10-CM

## 2018-08-23 DIAGNOSIS — IMO0001 Reserved for inherently not codable concepts without codable children: Secondary | ICD-10-CM

## 2018-08-23 DIAGNOSIS — T783XXD Angioneurotic edema, subsequent encounter: Secondary | ICD-10-CM

## 2018-08-23 DIAGNOSIS — Z974 Presence of external hearing-aid: Secondary | ICD-10-CM | POA: Diagnosis not present

## 2018-08-23 DIAGNOSIS — Z79899 Other long term (current) drug therapy: Secondary | ICD-10-CM | POA: Diagnosis not present

## 2018-08-23 DIAGNOSIS — L718 Other rosacea: Secondary | ICD-10-CM | POA: Diagnosis not present

## 2018-08-23 DIAGNOSIS — Z7984 Long term (current) use of oral hypoglycemic drugs: Secondary | ICD-10-CM

## 2018-08-23 DIAGNOSIS — E7800 Pure hypercholesterolemia, unspecified: Secondary | ICD-10-CM

## 2018-08-23 DIAGNOSIS — E785 Hyperlipidemia, unspecified: Secondary | ICD-10-CM | POA: Diagnosis not present

## 2018-08-23 DIAGNOSIS — Z6826 Body mass index (BMI) 26.0-26.9, adult: Secondary | ICD-10-CM | POA: Diagnosis not present

## 2018-08-23 LAB — GLUCOSE, CAPILLARY: GLUCOSE-CAPILLARY: 107 mg/dL — AB (ref 70–99)

## 2018-08-23 LAB — POCT GLYCOSYLATED HEMOGLOBIN (HGB A1C): Hemoglobin A1C: 6.6 % — AB (ref 4.0–5.6)

## 2018-08-23 MED ORDER — METRONIDAZOLE 0.75 % EX CREA: TOPICAL_CREAM | Freq: Every day | CUTANEOUS | 1 refills | Status: DC

## 2018-08-23 NOTE — Assessment & Plan Note (Addendum)
Assessment  His diabetes is well controlled today with a hemoglobin A1c of 6.6.  This is on glipizide XL 10 mg by mouth twice daily, metformin 1000 mg by mouth twice daily, and Januvia 100 mg by mouth daily.  He is tolerating this regimen well and he and his family are not interested in injection therapy at this time.  He is unable to tolerate ACE inhibitor secondary to angioedema.  Plan  We will continue glipizide XL 10 mg by mouth twice daily, metformin 1000 mg by mouth twice daily, and Januvia 100 mg by mouth daily.  He was encouraged to maintain his diet, avoid the sweets as he has apparently been doing, and continuing to take walks with his sister.  We will reassess the efficacy of this regimen at the follow-up visit.  A basic metabolic panel and urine for microalbuminuria were obtained today and are pending at the time of this dictation.

## 2018-08-23 NOTE — Progress Notes (Signed)
   Subjective:    Patient ID: Larry Wiley, male    DOB: [birth year], 52 y.o.   MRN: [id number]  HPI  Larry Wiley is here for follow-up of his type 2 diabetes with microalbuminuria, essential hypertension, hyperlipidemia, papular type acne rosacea, asymmetrical sensorineural hearing loss, angioedema, and overweight status. Please see the A&P for the status of the pt's chronic medical problems.  He has no acute complaints at this time.  Review of Systems  Constitutional: Negative for activity change, appetite change and unexpected weight change.  HENT: Negative for drooling, facial swelling, mouth sores, sore throat and trouble swallowing.   Respiratory: Negative for chest tightness, shortness of breath and wheezing.   Cardiovascular: Negative for chest pain and leg swelling.  Gastrointestinal: Negative for abdominal pain, constipation, diarrhea, nausea and vomiting.  Genitourinary: Negative for difficulty urinating.  Musculoskeletal: Negative for arthralgias, back pain, gait problem, joint swelling and myalgias.  Skin: Positive for rash.      Objective:   Physical Exam  Constitutional: He appears well-developed and well-nourished. No distress.  HENT:  Head: Normocephalic and atraumatic.  Eyes: Right eye exhibits no discharge. Left eye exhibits no discharge. No scleral icterus.  Cardiovascular: Normal rate, regular rhythm and normal heart sounds. Exam reveals no gallop and no friction rub.  No murmur heard. Pulmonary/Chest: Effort normal and breath sounds normal. No stridor. No respiratory distress. He has no wheezes. He has no rales.  Musculoskeletal: Normal range of motion. He exhibits no edema, tenderness or deformity.  Neurological: He is alert. He exhibits normal muscle tone.  Skin: Skin is warm and dry. Rash noted. He is not diaphoretic.  Acne rosacea of the nose is minimal  Psychiatric: He has a normal mood and affect. His behavior is normal.  Nursing note and vitals  reviewed.     Assessment & Plan:   Please see problem oriented charting.

## 2018-08-23 NOTE — Assessment & Plan Note (Signed)
Assessment  He is tolerating the atorvastatin 40 mg by mouth daily without myalgias.  Plan  We will continue with this high intensity statin and reassess for intolerances at the follow-up visit. 

## 2018-08-23 NOTE — Assessment & Plan Note (Signed)
His family remains uninterested in colonoscopic or colon cancer screening.  He is otherwise up-to-date on his healthcare maintenance.

## 2018-08-23 NOTE — Assessment & Plan Note (Signed)
Assessment  A few weeks ago he had an episode of lower lip swelling spontaneously.  It began to spread up the right side of his cheek.  There was no associated welts or pruritus.  He called the clinic and was asked to present to the emergency department.  Prior to arrival he had taken a Benadryl.  Upon arrival to the emergency department he was also given another Benadryl with resolution of his symptoms.  It should be noted that he had angioedema in the past when taking lisinopril.  It also should be noted that he has never had welts associated with his angioedema.  That said, he has also not had crampy abdominal pain and does not have a family history of angioedema.  Thus, it is unclear to me if this is an allergic angioedema or a hereditary angioedema.  The lack of pruritus and welts makes me lean towards a hereditary angioedema that will require further evaluation.  Plan  A C1 esterase inhibitor and C4 complement were obtained during this visit.  The results are pending at the time of this dictation.  If these lab tests suggest a hereditary angioedema we will proceed as appropriate.  If they are not suggestive of a hereditary angioedema we will refer to an allergist per the family's request.  This referral is pending the results of the complement tests drawn today.  In the meantime, he has Benadryl 25 mg by mouth as needed on hand as well as an EpiPen.  Obviously, we are also avoiding ACE inhibitors and angiotensin receptor blockers.

## 2018-08-23 NOTE — Assessment & Plan Note (Signed)
Assessment  His family was able to pull together some money to purchase the hearing aids that he required for his sensorineural hearing loss.  With these hearing aids he has been much improved and has required markedly decreased volume in order to hear the TV.  Plan  He will continue to use these hearing aids, which have been adjusted, has made he and his family happy.  We will reassess the efficacy of the hearing aids at the follow-up visit.

## 2018-08-23 NOTE — Assessment & Plan Note (Signed)
Assessment  His blood pressure today is at target at 132/73.  This is on hydrochlorothiazide 12.5 mg by mouth daily and Toprol-XL 12.5 mg by mouth daily.  He is tolerating this regimen well.  Plan  We will continue the hydrochlorothiazide 12.5 mg by mouth daily and Toprol-XL at 12.5 mg by mouth daily.  A basic metabolic panel was obtained during this visit and is pending at the time of this dictation.  We will reassess the efficacy of this antihypertensive regimen at the follow-up visit.

## 2018-08-23 NOTE — Patient Instructions (Signed)
It was wonderful to see you again.  1) I drew some blood today to check on the swelling of the lips.  I will call you with the results when I get them and we can proceed to the next step.  2) Keep taking the medications as you are.  3) You are doing wonderful with your diabetes.  Keep up with your diet and exercise.  I will see you in 6 months, sooner if necessary.

## 2018-08-23 NOTE — Assessment & Plan Note (Signed)
Assessment  Since the last clinic visit he has lost 3 pounds.  He was complemented on this success.  Plan  He was asked to continue to follow his diet which is heavy in vegetables and lower in sweets.  He was also asked to continue to walk on a regular basis with his sister.  We will continue to assess the effects of this lifestyle modification on his weight at the follow-up visit.

## 2018-08-23 NOTE — Assessment & Plan Note (Signed)
Assessment  His acne rosacea is well controlled on as needed MetroGel.  His sister asked for a refill of this medication.  Plan  We will continue the MetroGel for the acne rosacea.  A repeat prescription was sent to his pharmacy.  We will reassess the efficacy of this therapy at the follow-up visit.

## 2018-08-24 LAB — MICROALBUMIN / CREATININE URINE RATIO
CREATININE, UR: 48.2 mg/dL
MICROALB/CREAT RATIO: 9.1 mg/g{creat} (ref 0.0–30.0)
Microalbumin, Urine: 4.4 ug/mL

## 2018-08-26 LAB — C1 ESTERASE INHIBITOR: C1 ESTERASE INH: 23 mg/dL (ref 21–39)

## 2018-08-26 LAB — BMP8+ANION GAP
Anion Gap: 17 mmol/L (ref 10.0–18.0)
BUN/Creatinine Ratio: 14 (ref 9–20)
BUN: 12 mg/dL (ref 6–24)
CALCIUM: 9.8 mg/dL (ref 8.7–10.2)
CHLORIDE: 101 mmol/L (ref 96–106)
CO2: 24 mmol/L (ref 20–29)
Creatinine, Ser: 0.85 mg/dL (ref 0.76–1.27)
GFR calc Af Amer: 114 mL/min/{1.73_m2} (ref >59)
GFR calc non Af Amer: 99 mL/min/{1.73_m2} (ref >59)
GLUCOSE: 93 mg/dL (ref 65–99)
POTASSIUM: 4.3 mmol/L (ref 3.5–5.2)
Sodium: 142 mmol/L (ref 134–144)

## 2018-08-26 LAB — C4 COMPLEMENT: Complement C4, Serum: 29 mg/dL (ref 14–44)

## 2018-09-23 NOTE — Progress Notes (Signed)
Patient ID: Larry CousinJohn A Wiley, male   DOB: 04-21-1966, 52 y.o.   MRN: [id number]  C1NH and C4: Normal.  Does not entirely rule out hereditary angioedema, but is reassuring.  I called with the results and spoke with his sister.  She said he has had one episode in the interim which was swelling of the chin and very mild.  It resolved with benadryl.  She confirmed he is not taking NSAIDs.  Rather than empirically starting a double dose non-sedating antihistamine on a daily basis to clinically assess response (idiopathic histaminergic angioedema) we decided to refer to an allergist for a second opinion.  We also decided to stop the Asprin as the risks may outweigh the benefits if this is playing a role in his symptoms.  BMP was normal  Urine microalbumin was improved to the normal range despite no ACEI use.

## 2018-09-23 NOTE — Addendum Note (Signed)
Addended by: Doneen PoissonKLIMA, Janeva Peaster D on: 09/23/2018 11:28 AM   Modules accepted: Orders

## 2018-09-24 DIAGNOSIS — H01021 Squamous blepharitis right upper eyelid: Secondary | ICD-10-CM | POA: Diagnosis not present

## 2018-09-24 DIAGNOSIS — E119 Type 2 diabetes mellitus without complications: Secondary | ICD-10-CM | POA: Diagnosis not present

## 2018-09-24 DIAGNOSIS — H40013 Open angle with borderline findings, low risk, bilateral: Secondary | ICD-10-CM | POA: Diagnosis not present

## 2018-09-24 DIAGNOSIS — H01024 Squamous blepharitis left upper eyelid: Secondary | ICD-10-CM | POA: Diagnosis not present

## 2018-09-24 DIAGNOSIS — H5500 Unspecified nystagmus: Secondary | ICD-10-CM | POA: Diagnosis not present

## 2018-09-24 DIAGNOSIS — H01025 Squamous blepharitis left lower eyelid: Secondary | ICD-10-CM | POA: Diagnosis not present

## 2018-09-24 DIAGNOSIS — H01022 Squamous blepharitis right lower eyelid: Secondary | ICD-10-CM | POA: Diagnosis not present

## 2018-09-24 DIAGNOSIS — H2513 Age-related nuclear cataract, bilateral: Secondary | ICD-10-CM | POA: Diagnosis not present

## 2018-09-24 LAB — HM DIABETES EYE EXAM

## 2018-09-30 ENCOUNTER — Other Ambulatory Visit: Payer: Self-pay | Admitting: Internal Medicine

## 2018-09-30 DIAGNOSIS — R809 Proteinuria, unspecified: Principal | ICD-10-CM

## 2018-09-30 DIAGNOSIS — E1129 Type 2 diabetes mellitus with other diabetic kidney complication: Secondary | ICD-10-CM

## 2018-10-08 ENCOUNTER — Encounter: Payer: Self-pay | Admitting: Allergy

## 2018-10-08 ENCOUNTER — Ambulatory Visit (INDEPENDENT_AMBULATORY_CARE_PROVIDER_SITE_OTHER): Payer: Medicare Other | Admitting: Allergy

## 2018-10-08 VITALS — BP 122/80 | HR 88 | Resp 16 | Ht 61.0 in | Wt 145.2 lb

## 2018-10-08 DIAGNOSIS — J3089 Other allergic rhinitis: Secondary | ICD-10-CM

## 2018-10-08 DIAGNOSIS — T783XXD Angioneurotic edema, subsequent encounter: Secondary | ICD-10-CM | POA: Diagnosis not present

## 2018-10-08 MED ORDER — CETIRIZINE HCL 10 MG PO TABS: ORAL_TABLET | ORAL | 5 refills | Status: AC

## 2018-10-08 MED ORDER — TRIAMCINOLONE ACETONIDE 55 MCG/ACT NA AERO: INHALATION_SPRAY | NASAL | 5 refills | Status: DC

## 2018-10-08 NOTE — Progress Notes (Signed)
New Patient Note  RE: Larry Wiley MRN: [id number] DOB: 08-Jul-1966 Date of Office Visit: 10/08/2018  Referring provider: Oval Linsey, MD Primary care provider: Oval Linsey, MD  Chief Complaint: swelling  History of present illness: Larry Wiley is a 52 y.o. male presenting today for consultation for angioedema.  He has history of autism and presents with his 2 sisters.    He states he would develop lip swelling sometime after supper.  The swelling typically has involved one side of his lip extending to chin/jaw area on same side.  He has taken benadryl which would help and by the next day the swelling was resolved.  The swelling episodes started on 06/18/18 and has had subsequent episodes on 9/4, 9/11, 9/15, 10/1, 10/9 and 11/27.  Sisters provided journal of the swelling episodes and some of the episodes did happen different times of the day.  He denies difficulty breathing or swallowing with the swelling.  No tongue involvement.   No rash or itch with the swelling.  No GI, respiratory or CV related symptoms with the swelling.  They deny any preceding illness prior to onset of swelling, no stings, no new foods or change in diet, no change in body products/soaps/detergents/face products.  Sister states they are pretty consistent in the foods that they eat.  They do eat red meat products relatively regularly.  Review of his medications does not reveal any known medications to cause angioedema.  He used to take an ACE inhibitor but stopped this many years ago after having some type of reaction to the medication.  He also recently was recommended to stop his aspirin as he does not have any reason to need the cardioprotection.  Sister reports he has developed a lot of nasal congestion and drainage as he has aged and it seems to be worsening over the past several years.  He has not tried to use any nasal sprays or antihistamines up to this point to help with this.  He has no history of asthma,  eczema or food allergy.    Review of systems: Review of Systems  Constitutional: Negative for chills, fever and malaise/fatigue.  HENT: Positive for congestion. Negative for ear discharge, ear pain, nosebleeds, sinus pain and sore throat.   Eyes: Negative for pain, discharge and redness.  Respiratory: Negative for cough, shortness of breath and wheezing.   Cardiovascular: Negative for chest pain.  Gastrointestinal: Negative for abdominal pain, constipation, diarrhea, heartburn, nausea and vomiting.  Musculoskeletal: Negative for joint pain.  Skin: Negative for itching and rash.  Neurological: Negative for headaches.    All other systems negative unless noted above in HPI  Past medical history: Past Medical History:  Diagnosis Date  . Acne rosacea, papular type 02/25/2016  . Bilateral inguinal hernia   . Essential hypertension 08/18/2006  . Gastroesophageal reflux disease 09/27/2012   Intermittent symptoms, does not want therapy   . Hyperlipidemia LDL goal < 100 05/03/2007  . Mental retardation 08/18/2006  . Open-angle glaucoma 07/25/2013  . Overweight (BMI 25.0-29.9) 09/27/2012  . Type 2 diabetes mellitus with proteinuria or microalbuminuria 08/18/2006    Past surgical history: History reviewed. No pertinent surgical history.  Family history:  Family History  Problem Relation Age of Onset  . Hyperlipidemia Mother   . Hypertension Mother   . Liver cancer Mother   . Diabetes Father   . Heart attack Father     Social history:  Social History Narrative   Lives with sisters who  help him with meals, medications, etc. the home has carpeting with electric heating and central cooling.  There is not been any pets in the home since 2016.  There is no concern for water damage, mildew or roaches in the home.  He is disabled and thus does not work.  Sister does state he will help his brother-in-law with  mowing lawn in the warmer months.  He has no smoking history or smoke exposure.     Medication List: Allergies as of 10/08/2018      Reactions   Ace Inhibitors Other (See Comments)    angiodema   Amoxicillin Hives   Has patient had a PCN reaction causing immediate rash, facial/tongue/throat swelling, SOB or lightheadedness with hypotension: Yes Has patient had a PCN reaction causing severe rash involving mucus membranes or skin necrosis: No Has patient had a PCN reaction that required hospitalization No Has patient had a PCN reaction occurring within the last 10 years: Yes If all of the above answers are "NO", then may proceed with Cephalosporin use.   Amoxicillin-pot Clavulanate Hives      Medication List       Accurate as of October 08, 2018 11:18 AM. Always use your most recent med list.        ACCU-CHEK FASTCLIX LANCETS Misc check blood sugar up to 1 time a day as instructed   ACCU-CHEK GUIDE w/Device Kit 1 each by Does not apply route daily. check blood sugar up to 1 time a day as instructed.   aspirin EC 81 MG tablet Take 1 tablet (81 mg total) by mouth daily.   atorvastatin 40 MG tablet Commonly known as:  LIPITOR Take 1 tablet (40 mg total) by mouth daily.   cetirizine 10 MG tablet Commonly known as:  ZYRTEC ALLERGY Take 1 tablet by mouth once daily   EPINEPHrine 0.3 mg/0.3 mL Soaj injection Commonly known as:  EPI-PEN Inject 0.3 mLs (0.3 mg total) into the muscle as needed (for difficulty breathing).   glipiZIDE 10 MG 24 hr tablet Commonly known as:  GLUCOTROL XL Take 1 tablet (10 mg total) by mouth 2 (two) times daily.   glucose blood test strip Commonly known as:  ACCU-CHEK GUIDE check blood sugar up to 1 time a day as instructed   hydrochlorothiazide 12.5 MG capsule Commonly known as:  MICROZIDE Take 1 capsule (12.5 mg total) by mouth daily.   latanoprost 0.005 % ophthalmic solution Commonly known as:  XALATAN Place 1 drop into both eyes at bedtime.   metFORMIN 1000 MG tablet Commonly known as:  GLUCOPHAGE Take 1 tablet  (1,000 mg total) by mouth 2 (two) times daily with a meal.   metoprolol succinate 25 MG 24 hr tablet Commonly known as:  TOPROL-XL Take 0.5 tablets (12.5 mg total) by mouth daily.   metroNIDAZOLE 0.75 % cream Commonly known as:  METROCREAM Apply topically daily. Apply only to the rash   sitaGLIPtin 100 MG tablet Commonly known as:  JANUVIA Take 1 tablet (100 mg total) by mouth daily.   triamcinolone 55 MCG/ACT Aero nasal inhaler Commonly known as:  NASACORT ALLERGY 24HR Use 1-2 sprays in each nostril daily       Known medication allergies: Allergies  Allergen Reactions  . Ace Inhibitors Other (See Comments)     angiodema  . Amoxicillin Hives    Has patient had a PCN reaction causing immediate rash, facial/tongue/throat swelling, SOB or lightheadedness with hypotension: Yes Has patient had a PCN reaction causing severe rash  involving mucus membranes or skin necrosis: No Has patient had a PCN reaction that required hospitalization No Has patient had a PCN reaction occurring within the last 10 years: Yes If all of the above answers are "NO", then may proceed with Cephalosporin use.  Marland Kitchen Amoxicillin-Pot Clavulanate Hives     Physical examination: Blood pressure 122/80, pulse 88, resp. rate 16, height '5\' 1"'  (1.549 m), weight 145 lb 3.2 oz (65.9 kg).  General: Alert, interactive, in no acute distress. HEENT: PERRLA, TMs pearly gray, turbinates moderately edematous without discharge, post-pharynx non erythematous, missing 2 front upper teeth. Neck: Supple without lymphadenopathy. Lungs: Clear to auscultation without wheezing, rhonchi or rales. {no increased work of breathing. CV: Normal S1, S2 without murmurs. Abdomen: Nondistended, nontender. Skin: Warm and dry, without lesions or rashes. Extremities:  No clubbing, cyanosis or edema. Neuro:   Grossly intact.  Diagnositics/Labs: Labs:  Component     Latest Ref Rng & Units 08/23/2018  Glucose     65 - 99 mg/dL 93  BUN      6 - 24 mg/dL 12  Creatinine     0.76 - 1.27 mg/dL 0.85  GFR, Est Non African American     >59 mL/min/1.73 99  GFR, Est African American     >59 mL/min/1.73 114  BUN/Creatinine Ratio     9 - 20 14  Sodium     134 - 144 mmol/L 142  Potassium     3.5 - 5.2 mmol/L 4.3  Chloride     96 - 106 mmol/L 101  CO2     20 - 29 mmol/L 24  Anion gap     10.0 - 18.0 mmol/L 17.0  Calcium     8.7 - 10.2 mg/dL 9.8  Creatinine, Urine     Not Estab. mg/dL 48.2  Microalbumin, Urine     Not Estab. ug/mL 4.4  MICROALB/CREAT RATIO     0.0 - 30.0 mg/g creat 9.1  Hemoglobin A1C     4.0 - 5.6 % 6.6 (A)  Glucose-Capillary     70 - 99 mg/dL 107 (H)  Complement C4, Serum     14 - 44 mg/dL 29  C1INH SerPl-mCnc     21 - 39 mg/dL 23    Assessment and plan:   Angioedema   - swelling episodes can be caused by a variety of different reasons including hereditary form vs triggered by an allergen.  He has had improvement in the swelling with use of benadryl which likely means the swelling is being driven by histamine.  Histamine gets released for allergy cells after being triggered by allergen or could also be idiopathic (unknown trigger).  Will screen for possible allergens, completely hereditary angioedema work-up as well as screen for potential mast cell activating syndrome.  Labs as follows: C1q and C1 esterase inhibitor function as well as tryptase, alpha gal panel and environmental allergy panel.  - to help prevent swelling episodes recommend starting daily Zyrtec 21m daily.    - recommend use of benadryl as needed for breakthrough symptoms  Rhinitis  - as above will obtain environmental panel  - zyrtec as above  - provided with sample of Nasacort to use 1-2 sprays each nostril daily.  Use for 1-2 weeks at a time before stopping once symptoms improve  - can use nasal saline gel to help keep nose moisturized  Follow-up 3 months or sooner if needed   I appreciate the opportunity to take part in  Balthazar's care. Please  do not hesitate to contact me with questions.  Sincerely,   Prudy Feeler, MD Allergy/Immunology [facility]

## 2018-10-08 NOTE — Patient Instructions (Addendum)
Angioedema (swelling)  - swelling episodes can be caused by a variety of different reasons including hereditary form vs triggered by an allergen.  He has had improvement in the swelling with use of benadryl which likely means the swelling is being driven by histamine.  Histamine gets released for allergy cells after being triggered by allergen or could also be idiopathic (unknown trigger).    - will complete work-up for Hereditary angioedema with C1q and C1 esterase inhibitor function as well as tryptase, alpha gal panel and environmental allergy panel.  - to help prevent swelling episodes recommend starting daily Zyrtec 10mg  daily.    - recommend use of benadryl as needed for breakthrough symptoms  Nasal congestion  - as above will obtain environmental panel  - zyrtec as above  - provided with sample of Nasacort to use 1-2 sprays each nostril daily.  Use for 1-2 weeks at a time before stopping once symptoms improve  - can use nasal saline gel to help keep nose moisturized  Follow-up 3 months or sooner if needed

## 2018-10-10 ENCOUNTER — Encounter: Payer: Self-pay | Admitting: *Deleted

## 2018-10-16 LAB — ALLERGENS W/TOTAL IGE AREA 2
Bermuda Grass IgE: <0.1 kU/L
Cat Dander IgE: 0.18 kU/L — AB
Cladosporium Herbarum IgE: <0.1 kU/L
Cockroach, German IgE: <0.1 kU/L
Cottonwood IgE: <0.1 kU/L
D Farinae IgE: 7.2 kU/L — AB
D001-IGE D PTERONYSSINUS: 4.87 kU/L — AB
Dog Dander IgE: <0.1 kU/L
Elm, American IgE: <0.1 kU/L
IGE (IMMUNOGLOBULIN E), SERUM: 88 [IU]/mL (ref 6–495)
Johnson Grass IgE: <0.1 kU/L
Maple/Box Elder IgE: <0.1 kU/L
Oak, White IgE: <0.1 kU/L
Pecan, Hickory IgE: <0.1 kU/L
Penicillium Chrysogen IgE: <0.1 kU/L
Pigweed, Rough IgE: <0.1 kU/L
Timothy Grass IgE: <0.1 kU/L
White Mulberry IgE: <0.1 kU/L

## 2018-10-16 LAB — C1 ESTERASE INHIBITOR, FUNCTIONAL: C1INH Functional/C1INH Total MFr SerPl: 80 %mean normal

## 2018-10-16 LAB — ALPHA-GAL PANEL
Alpha Gal IgE*: <0.1 kU/L (ref <0.10)
BEEF CLASS INTERPRETATION: 0
Beef (Bos spp) IgE: <0.1 kU/L (ref <0.35)
Class Interpretation: 0
Lamb/Mutton (Ovis spp) IgE: <0.1 kU/L (ref <0.35)
PORK CLASS INTERPRETATION: 0

## 2018-10-16 LAB — COMPLEMENT COMPONENT C1Q: Complement C1Q: 11.3 mg/dL (ref 10.2–20.3)

## 2018-10-16 LAB — TRYPTASE: Tryptase: 7.1 ug/L (ref 2.2–13.2)

## 2019-01-20 ENCOUNTER — Encounter: Payer: Self-pay | Admitting: *Deleted

## 2019-02-18 ENCOUNTER — Other Ambulatory Visit: Payer: Self-pay | Admitting: *Deleted

## 2019-02-18 DIAGNOSIS — R809 Proteinuria, unspecified: Principal | ICD-10-CM

## 2019-02-18 DIAGNOSIS — E1129 Type 2 diabetes mellitus with other diabetic kidney complication: Secondary | ICD-10-CM

## 2019-02-18 DIAGNOSIS — I10 Essential (primary) hypertension: Secondary | ICD-10-CM

## 2019-02-18 MED ORDER — HYDROCHLOROTHIAZIDE 12.5 MG PO CAPS: 12.5000 mg | ORAL_CAPSULE | Freq: Every day | ORAL | 3 refills | Status: DC

## 2019-02-18 MED ORDER — METFORMIN HCL 1000 MG PO TABS: 1000.0000 mg | ORAL_TABLET | Freq: Two times a day (BID) | ORAL | 3 refills | Status: DC

## 2019-06-04 ENCOUNTER — Other Ambulatory Visit: Payer: Self-pay | Admitting: *Deleted

## 2019-06-04 DIAGNOSIS — E785 Hyperlipidemia, unspecified: Secondary | ICD-10-CM

## 2019-06-04 MED ORDER — ATORVASTATIN CALCIUM 40 MG PO TABS: 40.0000 mg | ORAL_TABLET | Freq: Every day | ORAL | 3 refills | Status: DC

## 2019-06-16 ENCOUNTER — Other Ambulatory Visit: Payer: Self-pay | Admitting: *Deleted

## 2019-06-16 DIAGNOSIS — I10 Essential (primary) hypertension: Secondary | ICD-10-CM

## 2019-06-16 MED ORDER — METOPROLOL SUCCINATE ER 25 MG PO TB24: 12.5000 mg | ORAL_TABLET | Freq: Every day | ORAL | 3 refills | Status: DC

## 2019-07-01 ENCOUNTER — Ambulatory Visit (INDEPENDENT_AMBULATORY_CARE_PROVIDER_SITE_OTHER): Payer: Medicare Other | Admitting: Internal Medicine

## 2019-07-01 ENCOUNTER — Encounter: Payer: Self-pay | Admitting: Internal Medicine

## 2019-07-01 ENCOUNTER — Other Ambulatory Visit: Payer: Self-pay

## 2019-07-01 VITALS — BP 111/67 | HR 87 | Temp 98.1°F | Ht 61.0 in | Wt 146.0 lb

## 2019-07-01 DIAGNOSIS — Z23 Encounter for immunization: Secondary | ICD-10-CM

## 2019-07-01 DIAGNOSIS — Z7984 Long term (current) use of oral hypoglycemic drugs: Secondary | ICD-10-CM

## 2019-07-01 DIAGNOSIS — T783XXD Angioneurotic edema, subsequent encounter: Secondary | ICD-10-CM | POA: Diagnosis not present

## 2019-07-01 DIAGNOSIS — Z79899 Other long term (current) drug therapy: Secondary | ICD-10-CM

## 2019-07-01 DIAGNOSIS — E785 Hyperlipidemia, unspecified: Secondary | ICD-10-CM

## 2019-07-01 DIAGNOSIS — Z91048 Other nonmedicinal substance allergy status: Secondary | ICD-10-CM | POA: Diagnosis not present

## 2019-07-01 DIAGNOSIS — Z974 Presence of external hearing-aid: Secondary | ICD-10-CM | POA: Diagnosis not present

## 2019-07-01 DIAGNOSIS — E1129 Type 2 diabetes mellitus with other diabetic kidney complication: Secondary | ICD-10-CM

## 2019-07-01 DIAGNOSIS — I10 Essential (primary) hypertension: Secondary | ICD-10-CM | POA: Diagnosis not present

## 2019-07-01 DIAGNOSIS — H9041 Sensorineural hearing loss, unilateral, right ear, with unrestricted hearing on the contralateral side: Secondary | ICD-10-CM

## 2019-07-01 DIAGNOSIS — L718 Other rosacea: Secondary | ICD-10-CM | POA: Diagnosis not present

## 2019-07-01 DIAGNOSIS — R809 Proteinuria, unspecified: Secondary | ICD-10-CM

## 2019-07-01 DIAGNOSIS — E7800 Pure hypercholesterolemia, unspecified: Secondary | ICD-10-CM

## 2019-07-01 DIAGNOSIS — Z0000 Encounter for general adult medical examination without abnormal findings: Secondary | ICD-10-CM

## 2019-07-01 DIAGNOSIS — IMO0001 Reserved for inherently not codable concepts without codable children: Secondary | ICD-10-CM

## 2019-07-01 LAB — GLUCOSE, CAPILLARY: Glucose-Capillary: 191 mg/dL — ABNORMAL HIGH (ref 70–99)

## 2019-07-01 LAB — POCT GLYCOSYLATED HEMOGLOBIN (HGB A1C): Hemoglobin A1C: 7.2 % — AB (ref 4.0–5.6)

## 2019-07-01 MED ORDER — ACCU-CHEK GUIDE VI STRP: ORAL_STRIP | 5 refills | Status: DC

## 2019-07-01 NOTE — Progress Notes (Signed)
   Subjective:    Patient ID: Larry Wiley, male    DOB: 07-Jun-1966, 53 y.o.   MRN: [id number]  HPI I have seen and examined this patient.  Patient is here for routine follow-up of his hypertension and diabetes.  Patient states that he feels well today and is compliant with all his medications.  He is accompanied by his sister who states that he is doing well.  He denies any new complaints today.   Review of Systems  Constitutional: Negative.   HENT: Negative.   Respiratory: Negative.   Cardiovascular: Negative.   Gastrointestinal: Negative.   Musculoskeletal: Negative.   Neurological: Negative.   Psychiatric/Behavioral: Negative.        Objective:   Physical Exam Constitutional:      Appearance: Normal appearance.  HENT:     Head: Normocephalic and atraumatic.     Right Ear: Ear canal and external ear normal.     Left Ear: Ear canal and external ear normal.     Mouth/Throat:     Mouth: Mucous membranes are moist.     Pharynx: Oropharynx is clear.  Neck:     Musculoskeletal: Neck supple. No muscular tenderness.  Cardiovascular:     Rate and Rhythm: Normal rate and regular rhythm.     Heart sounds: Normal heart sounds.  Pulmonary:     Effort: Pulmonary effort is normal.     Breath sounds: Normal breath sounds. No wheezing or rales.  Abdominal:     General: Bowel sounds are normal. There is no distension.     Palpations: Abdomen is soft.     Tenderness: There is no abdominal tenderness.  Musculoskeletal:        General: No swelling or tenderness.  Lymphadenopathy:     Cervical: No cervical adenopathy.  Neurological:     General: No focal deficit present.     Mental Status: He is alert and oriented to person, place, and time.  Psychiatric:        Mood and Affect: Mood normal.        Behavior: Behavior normal.           Assessment & Plan:  Please see problem based charting for assessment and plan:

## 2019-07-01 NOTE — Assessment & Plan Note (Signed)
-  This problem is chronic and stable -Patient sister states that when he has a flare of his acne rosacea they use the MetroGel and that they have had to use this more frequently during the summer especially since he started wearing a mask -We will continue with MetroGel as needed for his acne rosacea for now -No further work-up at this time

## 2019-07-01 NOTE — Assessment & Plan Note (Signed)
-  Flu shot given today -Patient also to do the FIT test at home.  Patient sister is concerned about the patient getting nervous if he required a colonoscopy and I explained to them that doing the fit test is a good way to assess for any issues and if it does come back positive then he would require a colonoscopy and they are agreeable to this at this time

## 2019-07-01 NOTE — Assessment & Plan Note (Signed)
Lab Results  Component Value Date   HGBA1C 7.2 (A) 07/01/2019   HGBA1C 6.6 (A) 08/23/2018   HGBA1C 7.4 (A) 03/21/2018     Assessment: Diabetes control:  Fair Progress toward A1C goal:   Deteriorated Comments: Patient is compliant with glipizide 10 mg twice daily, metformin 1000 mg twice daily and Januvia 100 mg daily  Plan: Medications:  continue current medications Home glucose monitoring: Frequency:   Timing:   Instruction/counseling given: reminded to bring medications to each visit Educational resources provided:   Self management tools provided:   Other plans: We will continue with current medications for now.  I discussed with the patient and his sister about possibly transitioning the patient off glipizide given the risk for hypoglycemic episodes and onto an alternative medication (likely in SGLT2 inhibitor).  They are agreeable to this and we will discuss this in more detail on the next visit

## 2019-07-01 NOTE — Assessment & Plan Note (Signed)
-  Patient has had no further episodes of angioedema -He is taking cetirizine daily -He did follow-up with the allergist and work-up for an acquired C1 esterase inhibitor deficiency was negative.  They did tell him that he is allergic to cats and dust mites and his family and him have been careful about him being around this -No further work-up at this time

## 2019-07-01 NOTE — Patient Instructions (Signed)
-  It was a pleasure seeing you today -We will check some blood work on you today -We will give you a stool test for you to check at home and mail back to [place] -We will check a urine test on you today as well -I have put in a refill for your test strips. -Please call me if you have any questions or concerns or need any refills on your medication

## 2019-07-01 NOTE — Assessment & Plan Note (Signed)
-  This problem is chronic and stable -We will continue with atorvastatin 40 mg daily for now -No further work-up at this time

## 2019-07-01 NOTE — Assessment & Plan Note (Signed)
BP Readings from Last 3 Encounters:  07/01/19 111/67  10/08/18 122/80  08/23/18 132/73    Lab Results  Component Value Date   NA 142 08/23/2018   K 4.3 08/23/2018   CREATININE 0.85 08/23/2018    Assessment: Blood pressure control:  Well controlled Progress toward BP goal:   At goal Comments: Continue with metoprolol XL 12.5 mg daily as well as hydrochlorothiazide 12.5 mg daily  Plan: Medications:  continue current medications Educational resources provided:   Self management tools provided:   Other plans: We will check BMP today

## 2019-07-01 NOTE — Assessment & Plan Note (Signed)
-  This problem is chronic and stable -Patient states that his hearing is much improved after getting his hearing aids -He will continue with his hearing aids for now -No further work-up at this time

## 2019-07-02 ENCOUNTER — Telehealth: Payer: Self-pay | Admitting: Internal Medicine

## 2019-07-02 LAB — BMP8+ANION GAP
Anion Gap: 17 mmol/L (ref 10.0–18.0)
BUN/Creatinine Ratio: 11 (ref 9–20)
BUN: 10 mg/dL (ref 6–24)
CO2: 20 mmol/L (ref 20–29)
Calcium: 9.9 mg/dL (ref 8.7–10.2)
Chloride: 99 mmol/L (ref 96–106)
Creatinine, Ser: 0.88 mg/dL (ref 0.76–1.27)
GFR calc Af Amer: 113 mL/min/{1.73_m2} (ref >59)
GFR calc non Af Amer: 97 mL/min/{1.73_m2} (ref >59)
Glucose: 175 mg/dL — ABNORMAL HIGH (ref 65–99)
Potassium: 4.3 mmol/L (ref 3.5–5.2)
Sodium: 136 mmol/L (ref 134–144)

## 2019-07-02 LAB — MICROALBUMIN / CREATININE URINE RATIO
Creatinine, Urine: 90.2 mg/dL
Microalb/Creat Ratio: 11 mg/g creat (ref 0–29)
Microalbumin, Urine: 10.3 ug/mL

## 2019-07-02 NOTE — Telephone Encounter (Signed)
I called the patient to discuss the blood work results with him.  I spoke with the patient sister Sandy Salaam).  Patient's BMP was within normal limits except for mildly elevated glucose.  I explained to the sister that we are still awaiting his urine microalbumin results.  No further work-up required at this time.  Sister expresses understanding and is in agreement with plan.

## 2019-07-03 ENCOUNTER — Other Ambulatory Visit: Payer: Self-pay | Admitting: *Deleted

## 2019-07-03 DIAGNOSIS — E1129 Type 2 diabetes mellitus with other diabetic kidney complication: Secondary | ICD-10-CM

## 2019-07-03 MED ORDER — GLIPIZIDE ER 10 MG PO TB24: 10.0000 mg | ORAL_TABLET | Freq: Two times a day (BID) | ORAL | 3 refills | Status: DC

## 2019-11-03 ENCOUNTER — Other Ambulatory Visit: Payer: Self-pay | Admitting: *Deleted

## 2019-11-03 DIAGNOSIS — E1129 Type 2 diabetes mellitus with other diabetic kidney complication: Secondary | ICD-10-CM

## 2019-11-03 DIAGNOSIS — R809 Proteinuria, unspecified: Secondary | ICD-10-CM

## 2019-11-03 MED ORDER — SITAGLIPTIN PHOSPHATE 100 MG PO TABS: 100.0000 mg | ORAL_TABLET | Freq: Every day | ORAL | 3 refills | Status: DC

## 2019-11-12 DIAGNOSIS — H2513 Age-related nuclear cataract, bilateral: Secondary | ICD-10-CM | POA: Diagnosis not present

## 2019-11-12 DIAGNOSIS — H0102B Squamous blepharitis left eye, upper and lower eyelids: Secondary | ICD-10-CM | POA: Diagnosis not present

## 2019-11-12 DIAGNOSIS — H5501 Congenital nystagmus: Secondary | ICD-10-CM | POA: Diagnosis not present

## 2019-11-12 DIAGNOSIS — H40013 Open angle with borderline findings, low risk, bilateral: Secondary | ICD-10-CM | POA: Diagnosis not present

## 2019-11-12 DIAGNOSIS — H0102A Squamous blepharitis right eye, upper and lower eyelids: Secondary | ICD-10-CM | POA: Diagnosis not present

## 2019-11-12 DIAGNOSIS — E119 Type 2 diabetes mellitus without complications: Secondary | ICD-10-CM | POA: Diagnosis not present

## 2019-11-12 LAB — HM DIABETES EYE EXAM

## 2019-11-14 ENCOUNTER — Encounter: Payer: Self-pay | Admitting: *Deleted

## 2019-12-23 ENCOUNTER — Ambulatory Visit (INDEPENDENT_AMBULATORY_CARE_PROVIDER_SITE_OTHER): Payer: Medicare Other | Admitting: Internal Medicine

## 2019-12-23 ENCOUNTER — Encounter: Payer: Self-pay | Admitting: Internal Medicine

## 2019-12-23 VITALS — BP 130/72 | HR 87 | Temp 97.8°F | Ht 61.0 in | Wt 145.2 lb

## 2019-12-23 DIAGNOSIS — Z79899 Other long term (current) drug therapy: Secondary | ICD-10-CM

## 2019-12-23 DIAGNOSIS — I10 Essential (primary) hypertension: Secondary | ICD-10-CM | POA: Diagnosis not present

## 2019-12-23 DIAGNOSIS — E1129 Type 2 diabetes mellitus with other diabetic kidney complication: Secondary | ICD-10-CM | POA: Diagnosis not present

## 2019-12-23 DIAGNOSIS — Z7984 Long term (current) use of oral hypoglycemic drugs: Secondary | ICD-10-CM

## 2019-12-23 DIAGNOSIS — Z6827 Body mass index (BMI) 27.0-27.9, adult: Secondary | ICD-10-CM | POA: Diagnosis not present

## 2019-12-23 DIAGNOSIS — E663 Overweight: Secondary | ICD-10-CM

## 2019-12-23 DIAGNOSIS — R809 Proteinuria, unspecified: Secondary | ICD-10-CM

## 2019-12-23 DIAGNOSIS — Z0000 Encounter for general adult medical examination without abnormal findings: Secondary | ICD-10-CM

## 2019-12-23 DIAGNOSIS — K219 Gastro-esophageal reflux disease without esophagitis: Secondary | ICD-10-CM | POA: Diagnosis not present

## 2019-12-23 LAB — POCT GLYCOSYLATED HEMOGLOBIN (HGB A1C): Hemoglobin A1C: 6.9 % — AB (ref 4.0–5.6)

## 2019-12-23 LAB — GLUCOSE, CAPILLARY: Glucose-Capillary: 166 mg/dL — ABNORMAL HIGH (ref 70–99)

## 2019-12-23 MED ORDER — PANTOPRAZOLE SODIUM 20 MG PO TBEC: 20.0000 mg | DELAYED_RELEASE_TABLET | Freq: Every day | ORAL | 1 refills | Status: DC

## 2019-12-23 NOTE — Assessment & Plan Note (Signed)
-  This problem is chronic and stable -Patient's weight has been stable over the last year -He continues to walk to try to lose weight -We will consider starting the patient on initial T2 inhibitor given his history of diabetes as well as being overweight.  We will discuss this at his follow-up visit

## 2019-12-23 NOTE — Assessment & Plan Note (Signed)
-  Patient is due for fit test at home.  Patient to get FIT test from lab today and will send the stool card in

## 2019-12-23 NOTE — Assessment & Plan Note (Signed)
-  Patient complains of burning abdominal pain especially at night which goes up to the back of his throat consistent with reflux symptoms -We will start patient on Protonix 20 mg daily for this -No further work-up at this time.

## 2019-12-23 NOTE — Assessment & Plan Note (Signed)
BP Readings from Last 3 Encounters:  12/23/19 130/72  07/01/19 111/67  10/08/18 122/80    Lab Results  Component Value Date   NA 136 07/01/2019   K 4.3 07/01/2019   CREATININE 0.88 07/01/2019    Assessment: Blood pressure control:  Well-controlled Progress toward BP goal:   At goal Comments: Patient is compliant with metoprolol 12.5 mg daily as well as hydrochlorothiazide 12.5 mg daily  Plan: Medications:  continue current medications Educational resources provided:   Self management tools provided:   Other plans:

## 2019-12-23 NOTE — Progress Notes (Signed)
   Subjective:    Patient ID: Larry Wiley, male    DOB: Feb 02, 1966, 54 y.o.   MRN: [id number]  HPI  I have seen and examined this patient.  Patient is here for routine follow-up of his hypertension and diabetes.  Patient states that he feels well and that he is compliant with all his medications.  He does complain of reflux symptoms at night and would like medication for this.  He denies any other complaints at this time.   Review of Systems  Constitutional: Negative.   HENT: Negative.   Respiratory: Negative.   Cardiovascular: Negative.   Gastrointestinal:       Complains of reflux  Musculoskeletal: Negative.   Skin: Negative.   Neurological: Negative.   Psychiatric/Behavioral: Negative.        Objective:   Physical Exam Vitals reviewed.  Constitutional:      Appearance: Normal appearance.  HENT:     Head: Normocephalic and atraumatic.     Right Ear: Ear canal and external ear normal.     Left Ear: Ear canal and external ear normal.     Mouth/Throat:     Mouth: Mucous membranes are moist.     Pharynx: Oropharynx is clear.  Cardiovascular:     Rate and Rhythm: Normal rate and regular rhythm.     Heart sounds: Normal heart sounds.  Pulmonary:     Effort: Pulmonary effort is normal.     Breath sounds: Normal breath sounds. No wheezing or rales.  Abdominal:     General: Bowel sounds are normal. There is no distension.     Palpations: Abdomen is soft.     Tenderness: There is no abdominal tenderness.  Musculoskeletal:        General: No swelling or tenderness.     Cervical back: Neck supple.  Lymphadenopathy:     Cervical: No cervical adenopathy.  Skin:    General: Skin is warm and dry.  Neurological:     General: No focal deficit present.     Mental Status: He is alert and oriented to person, place, and time.  Psychiatric:        Mood and Affect: Mood normal.        Behavior: Behavior normal.           Assessment & Plan:  Please see problem based  charting for assessment and plan:

## 2019-12-23 NOTE — Patient Instructions (Addendum)
-  It was a pleasure seeing you today -Your blood pressure is well controlled.  Keep up the great work -Your A1c today is 6.9.  This is at goal.  Continue taking your diabetes medications.  If it continues to improve we will try to come down on some of these medications. -You are due for foot exam today.  We will do this here -You are also due for stool card test.  We will give this to you today. -Please call me with any questions or concerns

## 2019-12-23 NOTE — Assessment & Plan Note (Signed)
Lab Results  Component Value Date   HGBA1C 6.9 (A) 12/23/2019   HGBA1C 7.2 (A) 07/01/2019   HGBA1C 6.6 (A) 08/23/2018     Assessment: Diabetes control:  Well-controlled Progress toward A1C goal:   At goal Comments: Patient is compliant with glipizide 10 mg twice daily, Metformin 1000 mg twice daily and Januvia 100 mg daily  Plan: Medications:  continue current medications Home glucose monitoring: Frequency:   Timing:   Instruction/counseling given: discussed foot care and discussed the need for weight loss Educational resources provided:   Self management tools provided:   Other plans: We will continue with current medications for now.  I am inclined to start the patient on SGLT2 inhibitor to help with weight and take the patient off glipizide and Januvia.  Will discuss this with the patient and his sister on his follow-up visit.

## 2020-01-15 ENCOUNTER — Ambulatory Visit: Payer: Medicare Other | Attending: Internal Medicine

## 2020-01-15 DIAGNOSIS — Z23 Encounter for immunization: Secondary | ICD-10-CM

## 2020-01-15 NOTE — Progress Notes (Signed)
   Covid-19 Vaccination Clinic  Name:  Larry Wiley    MRN: [id number] DOB: 1966/04/01  01/15/2020  [name] was observed post Covid-19 immunization for 15 minutes without incident. He was provided with Vaccine Information Sheet and instruction to access the V-Safe system.   [name] was instructed to call 911 with any severe reactions post vaccine: Marland Kitchen Difficulty breathing  . Swelling of face and throat  . A fast heartbeat  . A bad rash all over body  . Dizziness and weakness   Immunizations Administered    Name Date Dose VIS Date Route   Pfizer COVID-19 Vaccine 01/15/2020  9:34 AM 0.3 mL 10/03/2019 Intramuscular   Manufacturer: [employer], [employer]   Lot: [id number]   NDC: [address]-3

## 2020-01-16 ENCOUNTER — Other Ambulatory Visit: Payer: Self-pay | Admitting: Internal Medicine

## 2020-01-16 DIAGNOSIS — K219 Gastro-esophageal reflux disease without esophagitis: Secondary | ICD-10-CM

## 2020-02-14 ENCOUNTER — Other Ambulatory Visit: Payer: Self-pay | Admitting: Internal Medicine

## 2020-02-14 DIAGNOSIS — I10 Essential (primary) hypertension: Secondary | ICD-10-CM

## 2020-02-14 DIAGNOSIS — E1129 Type 2 diabetes mellitus with other diabetic kidney complication: Secondary | ICD-10-CM

## 2020-02-16 ENCOUNTER — Ambulatory Visit: Payer: Medicare Other | Attending: Internal Medicine

## 2020-02-16 DIAGNOSIS — Z23 Encounter for immunization: Secondary | ICD-10-CM

## 2020-02-16 NOTE — Progress Notes (Signed)
   Covid-19 Vaccination Clinic  Name:  Larry Wiley    MRN: [id number] DOB: 01-Feb-1966  02/16/2020  [name] was observed post Covid-19 immunization for 15 minutes without incident. He was provided with Vaccine Information Sheet and instruction to access the V-Safe system.   [name] was instructed to call 911 with any severe reactions post vaccine: Marland Kitchen Difficulty breathing  . Swelling of face and throat  . A fast heartbeat  . A bad rash all over body  . Dizziness and weakness   Immunizations Administered    Name Date Dose VIS Date Route   Pfizer COVID-19 Vaccine 02/16/2020  8:53 AM 0.3 mL 12/17/2018 Intramuscular   Manufacturer: [employer], [employer]   Lot: [id number]   NDC: [address]-1

## 2020-03-16 ENCOUNTER — Other Ambulatory Visit: Payer: Self-pay | Admitting: Internal Medicine

## 2020-03-16 DIAGNOSIS — E785 Hyperlipidemia, unspecified: Secondary | ICD-10-CM

## 2020-03-18 ENCOUNTER — Other Ambulatory Visit: Payer: Self-pay | Admitting: *Deleted

## 2020-03-18 DIAGNOSIS — L718 Other rosacea: Secondary | ICD-10-CM

## 2020-03-19 MED ORDER — METRONIDAZOLE 0.75 % EX CREA: TOPICAL_CREAM | Freq: Every day | CUTANEOUS | 1 refills | Status: DC

## 2020-05-04 ENCOUNTER — Encounter: Payer: Self-pay | Admitting: *Deleted

## 2020-05-25 ENCOUNTER — Ambulatory Visit (INDEPENDENT_AMBULATORY_CARE_PROVIDER_SITE_OTHER): Payer: Medicare Other | Admitting: Internal Medicine

## 2020-05-25 VITALS — BP 128/82 | HR 90 | Temp 98.0°F | Ht 61.0 in | Wt 144.4 lb

## 2020-05-25 DIAGNOSIS — Z23 Encounter for immunization: Secondary | ICD-10-CM

## 2020-05-25 DIAGNOSIS — K219 Gastro-esophageal reflux disease without esophagitis: Secondary | ICD-10-CM

## 2020-05-25 DIAGNOSIS — Z0000 Encounter for general adult medical examination without abnormal findings: Secondary | ICD-10-CM

## 2020-05-25 DIAGNOSIS — E1169 Type 2 diabetes mellitus with other specified complication: Secondary | ICD-10-CM

## 2020-05-25 DIAGNOSIS — R809 Proteinuria, unspecified: Secondary | ICD-10-CM

## 2020-05-25 DIAGNOSIS — E1129 Type 2 diabetes mellitus with other diabetic kidney complication: Secondary | ICD-10-CM | POA: Diagnosis not present

## 2020-05-25 DIAGNOSIS — E7800 Pure hypercholesterolemia, unspecified: Secondary | ICD-10-CM

## 2020-05-25 DIAGNOSIS — I10 Essential (primary) hypertension: Secondary | ICD-10-CM | POA: Diagnosis not present

## 2020-05-25 DIAGNOSIS — E785 Hyperlipidemia, unspecified: Secondary | ICD-10-CM

## 2020-05-25 LAB — POCT GLYCOSYLATED HEMOGLOBIN (HGB A1C): Hemoglobin A1C: 8.3 % — AB (ref 4.0–5.6)

## 2020-05-25 LAB — GLUCOSE, CAPILLARY: Glucose-Capillary: 140 mg/dL — ABNORMAL HIGH (ref 70–99)

## 2020-05-25 MED ORDER — DAPAGLIFLOZIN PROPANEDIOL 5 MG PO TABS: 5.0000 mg | ORAL_TABLET | Freq: Every day | ORAL | 3 refills | Status: DC

## 2020-05-25 NOTE — Progress Notes (Signed)
   Subjective:    Patient ID: Larry Wiley, male    DOB: Oct 18, 1966, 54 y.o.   MRN: [id number]  HPI  I have seen and examined this patient.  Patient is here for routine follow-up of his hypertension and diabetes.  Patient states that he feels well currently and denies any new complaints.  He is compliant with all his medications.  He does state that he has not been compliant with a diabetic diet and has been "sneaking sweets".   Review of Systems  Constitutional: Negative.   HENT: Negative.   Respiratory: Negative.   Cardiovascular: Negative.   Gastrointestinal: Negative.   Musculoskeletal: Negative.   Neurological: Negative.   Psychiatric/Behavioral: Negative.        Objective:   Physical Exam Vitals reviewed.  Constitutional:      Appearance: Normal appearance.  HENT:     Head: Normocephalic and atraumatic.  Cardiovascular:     Rate and Rhythm: Normal rate and regular rhythm.     Heart sounds: Normal heart sounds.  Pulmonary:     Breath sounds: Normal breath sounds. No wheezing or rales.  Abdominal:     General: Bowel sounds are normal. There is no distension.     Palpations: Abdomen is soft.     Tenderness: There is no abdominal tenderness.  Musculoskeletal:        General: No swelling or tenderness.     Cervical back: Neck supple.  Lymphadenopathy:     Cervical: No cervical adenopathy.  Neurological:     Mental Status: He is alert and oriented to person, place, and time.  Psychiatric:        Mood and Affect: Mood normal.        Behavior: Behavior normal.           Assessment & Plan:  Please see problem based charting for assessment and plan:

## 2020-05-25 NOTE — Assessment & Plan Note (Signed)
-  This problem is chronic and stable -We will continue atorvastatin 40 mg daily for now and consider rechecking lipid panel at next visit -No further work-up at this time

## 2020-05-25 NOTE — Assessment & Plan Note (Signed)
Lab Results  Component Value Date   HGBA1C 8.3 (A) 05/25/2020   HGBA1C 6.9 (A) 12/23/2019   HGBA1C 7.2 (A) 07/01/2019     Assessment: Diabetes control:  Fair Progress toward A1C goal:   Deteriorated Comments: Patient is compliant with Januvia 100 mg daily, Metformin 1000 mg twice daily as well as glipizide 10 mg twice daily.  He has not been compliant with his diabetic diet and I suspect this is the reason for his increase in A1c to 8.3  Plan: Medications:  continue current medications, We will continue with the glipizide 10 mg twice daily as well as Januvia 100 mg daily and Metformin 1000 mg twice daily.  We will add Farxiga 5 mg daily to his regimen Home glucose monitoring: Frequency:   Timing:   Instruction/counseling given: discussed the need for weight loss Educational resources provided:   Self management tools provided:   Other plans: We will check BMP at next visit

## 2020-05-25 NOTE — Assessment & Plan Note (Signed)
-  At patient's last visit he was complaining of burning abdominal pain consistent with reflux -We had started him on Protonix 20 mg daily at that time -Patient states that this is helped a lot with his symptoms and his burning pain has resolved -No further work-up at this time

## 2020-05-25 NOTE — Assessment & Plan Note (Signed)
BP Readings from Last 3 Encounters:  05/25/20 128/82  12/23/19 130/72  07/01/19 111/67    Lab Results  Component Value Date   NA 136 07/01/2019   K 4.3 07/01/2019   CREATININE 0.88 07/01/2019    Assessment: Blood pressure control:  Well-controlled Progress toward BP goal:   At goal Comments: Patient is compliant with metoprolol 12.5 mg daily as well as hydrochlorothiazide 12.5 mg daily  Plan: Medications:  continue current medications Educational resources provided:   Self management tools provided:   Other plans: We will check BMP at follow-up visit

## 2020-05-25 NOTE — Assessment & Plan Note (Signed)
-  Patient is due for Tdap today which we will give him -Patient will also take the flu vaccine once it is available -Patient sister requests that patient get Cologuard instead of fit test.  We will attempt obtain this for the patient

## 2020-05-25 NOTE — Patient Instructions (Addendum)
-  It was a pleasure seeing you again today -I have started you on dapagliflozin for your diabetes and sent it into your pharmacy for you -Please follow-up in 3 months for repeat A1c -Your blood pressure is well controlled.  Keep up the great work! -Please attempt to diet and exercise as possible and attempt to lose weight and improve your diabetes -I will also put in a request to [facility] to send you the kit.  You will have to mail it back to them once he obtains sample -Please call me if you have any questions or concerns or if you need any refills

## 2020-05-26 ENCOUNTER — Telehealth: Payer: Self-pay | Admitting: Internal Medicine

## 2020-05-26 LAB — MICROALBUMIN / CREATININE URINE RATIO
Creatinine, Urine: 60.4 mg/dL
Microalb/Creat Ratio: 18 mg/g creat (ref 0–29)
Microalbumin, Urine: 11 ug/mL

## 2020-05-26 NOTE — Telephone Encounter (Signed)
I called the patient to discuss the results of his urine test with him.  I spoke with the patient's Sister Tyrez Berrios.  I explained that the patient's urine test showed that he had a normal level of protein in his urine.  We did discuss his elevated A1c of 8.3 and that we started him on a new medication for this and we did talk about improving his diet and avoiding sweets.  Sister expressed understanding and is in agreement with plan.

## 2020-06-08 ENCOUNTER — Encounter: Payer: Medicare Other | Admitting: Internal Medicine

## 2020-06-11 ENCOUNTER — Other Ambulatory Visit: Payer: Self-pay | Admitting: Internal Medicine

## 2020-06-11 DIAGNOSIS — I10 Essential (primary) hypertension: Secondary | ICD-10-CM

## 2020-06-22 ENCOUNTER — Other Ambulatory Visit: Payer: Self-pay | Admitting: Internal Medicine

## 2020-06-22 DIAGNOSIS — E1129 Type 2 diabetes mellitus with other diabetic kidney complication: Secondary | ICD-10-CM

## 2020-07-14 ENCOUNTER — Other Ambulatory Visit: Payer: Self-pay | Admitting: Internal Medicine

## 2020-07-14 DIAGNOSIS — Z1211 Encounter for screening for malignant neoplasm of colon: Secondary | ICD-10-CM | POA: Diagnosis not present

## 2020-07-14 DIAGNOSIS — Z1212 Encounter for screening for malignant neoplasm of rectum: Secondary | ICD-10-CM | POA: Diagnosis not present

## 2020-07-14 DIAGNOSIS — K219 Gastro-esophageal reflux disease without esophagitis: Secondary | ICD-10-CM

## 2020-07-17 LAB — COLOGUARD: COLOGUARD: NEGATIVE

## 2020-07-26 LAB — COLOGUARD

## 2020-09-01 ENCOUNTER — Other Ambulatory Visit: Payer: Self-pay | Admitting: Internal Medicine

## 2020-09-01 DIAGNOSIS — E1129 Type 2 diabetes mellitus with other diabetic kidney complication: Secondary | ICD-10-CM

## 2020-09-15 ENCOUNTER — Other Ambulatory Visit: Payer: Self-pay | Admitting: Internal Medicine

## 2020-09-15 DIAGNOSIS — E1129 Type 2 diabetes mellitus with other diabetic kidney complication: Secondary | ICD-10-CM

## 2020-09-15 DIAGNOSIS — L718 Other rosacea: Secondary | ICD-10-CM

## 2020-09-21 ENCOUNTER — Encounter: Payer: Self-pay | Admitting: Internal Medicine

## 2020-09-21 ENCOUNTER — Telehealth: Payer: Self-pay | Admitting: Internal Medicine

## 2020-09-21 ENCOUNTER — Ambulatory Visit (INDEPENDENT_AMBULATORY_CARE_PROVIDER_SITE_OTHER): Payer: Medicare Other | Admitting: Internal Medicine

## 2020-09-21 VITALS — BP 155/81 | HR 76 | Temp 97.0°F | Ht 61.0 in | Wt 143.8 lb

## 2020-09-21 DIAGNOSIS — I10 Essential (primary) hypertension: Secondary | ICD-10-CM

## 2020-09-21 DIAGNOSIS — R809 Proteinuria, unspecified: Secondary | ICD-10-CM | POA: Diagnosis not present

## 2020-09-21 DIAGNOSIS — E7800 Pure hypercholesterolemia, unspecified: Secondary | ICD-10-CM | POA: Diagnosis not present

## 2020-09-21 DIAGNOSIS — Z7984 Long term (current) use of oral hypoglycemic drugs: Secondary | ICD-10-CM

## 2020-09-21 DIAGNOSIS — Z0000 Encounter for general adult medical examination without abnormal findings: Secondary | ICD-10-CM

## 2020-09-21 DIAGNOSIS — Z23 Encounter for immunization: Secondary | ICD-10-CM | POA: Diagnosis not present

## 2020-09-21 DIAGNOSIS — E1129 Type 2 diabetes mellitus with other diabetic kidney complication: Secondary | ICD-10-CM | POA: Diagnosis not present

## 2020-09-21 DIAGNOSIS — L718 Other rosacea: Secondary | ICD-10-CM

## 2020-09-21 LAB — POCT GLYCOSYLATED HEMOGLOBIN (HGB A1C): Hemoglobin A1C: 6.9 % — AB (ref 4.0–5.6)

## 2020-09-21 LAB — GLUCOSE, CAPILLARY: Glucose-Capillary: 118 mg/dL — ABNORMAL HIGH (ref 70–99)

## 2020-09-21 NOTE — Assessment & Plan Note (Signed)
-  This problem is chronic and stable -We will continue with atorvastatin 40 mg daily -We will recheck lipid panel today -No further work-up for now

## 2020-09-21 NOTE — Assessment & Plan Note (Signed)
-  Flu shot given today -Cologuard results were negative for occult blood

## 2020-09-21 NOTE — Progress Notes (Signed)
   Subjective:    Patient ID: Larry Wiley, male    DOB: August 02, 1966, 54 y.o.   MRN: [id number]  HPI I have seen and examined this patient.  Patient is here for routine follow-up of his hypertension and diabetes.  Patient states that he feels well today and denies any new complaints.  Patient is compliant with all his medications.  He is accompanied by his sister states that patient's blood sugars have been okay at home.   Review of Systems  Constitutional: Negative.   HENT: Negative.   Respiratory: Negative.   Cardiovascular: Negative.   Gastrointestinal: Negative.   Musculoskeletal: Negative.   Neurological: Negative.   Psychiatric/Behavioral: Negative.        Objective:   Physical Exam Constitutional:      Appearance: Normal appearance.  HENT:     Head: Normocephalic and atraumatic.  Cardiovascular:     Rate and Rhythm: Normal rate and regular rhythm.     Heart sounds: Normal heart sounds.  Pulmonary:     Breath sounds: Normal breath sounds. No wheezing or rales.  Abdominal:     General: Bowel sounds are normal. There is no distension.     Palpations: Abdomen is soft.     Tenderness: There is no abdominal tenderness.  Musculoskeletal:        General: No swelling or tenderness.     Cervical back: Neck supple.  Lymphadenopathy:     Cervical: No cervical adenopathy.  Neurological:     Mental Status: He is alert and oriented to person, place, and time.  Psychiatric:        Mood and Affect: Mood normal.        Behavior: Behavior normal.           Assessment & Plan:  Please see problem based charting for assessment and plan:

## 2020-09-21 NOTE — Patient Instructions (Signed)
-  It was a pleasure seeing you today -Have a great holiday season! -Your A1c is down to 6.9 today.  Keep up the great work! -We will give you your flu shot today -We will check your kidney function as well as your cholesterol levels -Please call me if you have any questions or concerns or if you need any refills

## 2020-09-21 NOTE — Telephone Encounter (Signed)
Pt's care giver calling back to confirm the results were faxed back to the [facility].  Please see result notes in Media for DOS scanned in on 07/14/2020 for this patient.

## 2020-09-21 NOTE — Assessment & Plan Note (Addendum)
-  This problem is chronic and stable -Patient sister states that they have a refill of the MetroGel at home and to use this whenever he has a flare -We will continue metronidazole gel as needed for his acne rosacea -no further work-up at this time

## 2020-09-21 NOTE — Progress Notes (Signed)
69 

## 2020-09-21 NOTE — Assessment & Plan Note (Signed)
BP Readings from Last 3 Encounters:  09/21/20 (!) 155/81  05/25/20 128/82  12/23/19 130/72    Lab Results  Component Value Date   NA 136 07/01/2019   K 4.3 07/01/2019   CREATININE 0.88 07/01/2019    Assessment: Blood pressure control:  Uncontrolled Progress toward BP goal:   Deteriorated Comments: Patient is compliant with Toprol-XL 25 mg as well as hydrochlorothiazide 12.5 mg daily  Plan: Medications:  continue current medications Educational resources provided:   Self management tools provided:   Other plans: Patient blood pressure has been well controlled for now.  We will not change his medications at this time.  He has been off his diet due to Thanksgiving and I suspect this may be playing a role in his elevated blood pressure.  If his blood pressure remains elevated at the next visit will increase his hydrochlorothiazide to 25 mg

## 2020-09-21 NOTE — Telephone Encounter (Signed)
Thank you :)

## 2020-09-21 NOTE — Assessment & Plan Note (Signed)
Lab Results  Component Value Date   HGBA1C 6.9 (A) 09/21/2020   HGBA1C 8.3 (A) 05/25/2020   HGBA1C 6.9 (A) 12/23/2019     Assessment: Diabetes control:  Well-controlled Progress toward A1C goal:   At goal Comments: Patient is compliant with Januvia 100 mg daily, Metformin 1000 mg twice daily, glipizide 10 mg twice daily as well as Farxiga 5 mg daily  Plan: Medications:  continue current medications Home glucose monitoring: Frequency:   Timing:   Instruction/counseling given: discussed diet Educational resources provided:   Self management tools provided:   Other plans: We will check BMP today

## 2020-09-22 ENCOUNTER — Telehealth: Payer: Self-pay | Admitting: Internal Medicine

## 2020-09-22 LAB — BMP8+ANION GAP
Anion Gap: 15 mmol/L (ref 10.0–18.0)
BUN/Creatinine Ratio: 10 (ref 9–20)
BUN: 9 mg/dL (ref 6–24)
CO2: 25 mmol/L (ref 20–29)
Calcium: 10 mg/dL (ref 8.7–10.2)
Chloride: 101 mmol/L (ref 96–106)
Creatinine, Ser: 0.93 mg/dL (ref 0.76–1.27)
GFR calc Af Amer: 106 mL/min/{1.73_m2} (ref >59)
GFR calc non Af Amer: 91 mL/min/{1.73_m2} (ref >59)
Glucose: 111 mg/dL — ABNORMAL HIGH (ref 65–99)
Potassium: 4.6 mmol/L (ref 3.5–5.2)
Sodium: 141 mmol/L (ref 134–144)

## 2020-09-22 LAB — LIPID PANEL
Chol/HDL Ratio: 3 ratio (ref 0.0–5.0)
Cholesterol, Total: 86 mg/dL — ABNORMAL LOW (ref 100–199)
HDL: 29 mg/dL — ABNORMAL LOW (ref >39)
LDL Chol Calc (NIH): 40 mg/dL (ref 0–99)
Triglycerides: 84 mg/dL (ref 0–149)
VLDL Cholesterol Cal: 17 mg/dL (ref 5–40)

## 2020-09-22 NOTE — Telephone Encounter (Signed)
I called and spoke to the patient's sister and discussed the patient's blood work with her.  Patient BMP was within normal limits except for mildly elevated glucose and his LDL was at goal but he also was noted to have low HDL.  We will not change medications at this time.  No further work-up for now.  Sister expresses understanding and is in agreement with plan.

## 2020-10-06 DIAGNOSIS — Z23 Encounter for immunization: Secondary | ICD-10-CM | POA: Diagnosis not present

## 2020-10-12 ENCOUNTER — Other Ambulatory Visit: Payer: Self-pay | Admitting: Internal Medicine

## 2020-10-12 DIAGNOSIS — E1129 Type 2 diabetes mellitus with other diabetic kidney complication: Secondary | ICD-10-CM

## 2020-11-18 ENCOUNTER — Encounter: Payer: Self-pay | Admitting: *Deleted

## 2020-11-18 DIAGNOSIS — H2513 Age-related nuclear cataract, bilateral: Secondary | ICD-10-CM | POA: Diagnosis not present

## 2020-11-18 DIAGNOSIS — H40013 Open angle with borderline findings, low risk, bilateral: Secondary | ICD-10-CM | POA: Diagnosis not present

## 2020-11-18 DIAGNOSIS — H0102B Squamous blepharitis left eye, upper and lower eyelids: Secondary | ICD-10-CM | POA: Diagnosis not present

## 2020-11-18 DIAGNOSIS — H5501 Congenital nystagmus: Secondary | ICD-10-CM | POA: Diagnosis not present

## 2020-11-18 DIAGNOSIS — H0102A Squamous blepharitis right eye, upper and lower eyelids: Secondary | ICD-10-CM | POA: Diagnosis not present

## 2020-11-18 DIAGNOSIS — E119 Type 2 diabetes mellitus without complications: Secondary | ICD-10-CM | POA: Diagnosis not present

## 2020-11-18 LAB — HM DIABETES EYE EXAM

## 2020-11-25 ENCOUNTER — Encounter: Payer: Self-pay | Admitting: *Deleted

## 2020-11-25 NOTE — Progress Notes (Unsigned)

## 2020-12-13 LAB — HM DIABETES EYE EXAM

## 2020-12-13 NOTE — Progress Notes (Unsigned)
Things That May Be Affecting Your Health:  Alcohol  Hearing loss  Pain    Depression  Home Safety  Sexual Health  x Diabetes  Lack of physical activity  Stress   Difficulty with daily activities  Loneliness  Tiredness   Drug use  Medicines  Tobacco use   Falls  Motor Vehicle Safety  Weight  x Food choices  Oral Health  Other    YOUR PERSONALIZED HEALTH PLAN : 1. Schedule your next subsequent Medicare Wellness visit in one year 2. Attend all of your regular appointments to address your medical issues 3. Complete the preventative screenings and services   Annual Wellness Visit   Medicare Covered Preventative Screenings and Services  Services & Screenings Men and Women Who How Often Need? Date of Last Service Action  Abdominal Aortic Aneurysm Adults with AAA risk factors Once      Alcohol Misuse and Counseling All Adults Screening once a year if no alcohol misuse. Counseling up to 4 face to face sessions.     Bone Density Measurement  Adults at risk for osteoporosis Once every 2 yrs      Lipid Panel Z13.6 All adults without CV disease Once every 5 yrs       Colorectal Cancer   Stool sample or  Colonoscopy All adults 50 and older   Once every year  Every 10 years        Depression All Adults Once a year  Today   Diabetes Screening Blood glucose, post glucose load, or GTT Z13.1  All adults at risk  Pre-diabetics  Once per year  Twice per year      Diabetes  Self-Management Training All adults Diabetics 10 hrs first year; 2 hours subsequent years. Requires Copay     Glaucoma  Diabetics  Family history of glaucoma  African Americans 50 yrs +  Hispanic Americans 65 yrs + Annually - requires coppay      Hepatitis C Z72.89 or F19.20  High Risk for HCV  Born between 1945 and 1965  Annually  Once      HIV Z11.4 All adults based on risk  Annually btw ages 30 & 52 regardless of risk  Annually > 65 yrs if at increased risk      Lung Cancer Screening  Asymptomatic adults aged 31-77 with 30 pack yr history and current smoker OR quit within the last 15 yrs Annually Must have counseling and shared decision making documentation before first screen x     Medical Nutrition Therapy Adults with   Diabetes  Renal disease  Kidney transplant within past 3 yrs 3 hours first year; 2 hours subsequent years     Obesity and Counseling All adults Screening once a year Counseling if BMI 30 or higher  Today   Tobacco Use Counseling Adults who use tobacco  Up to 8 visits in one year     Vaccines Z23  Hepatitis B  Influenza   Pneumonia  Adults   Once  Once every flu season  Two different vaccines separated by one year x Covid booster   Next Annual Wellness Visit People with Medicare Every year  Today     Services & Screenings Women Who How Often Need  Date of Last Service Action  Mammogram  Z12.31 Women over 40 One baseline ages 7-39. Annually ager 40 yrs+      Pap tests All women Annually if high risk. Every 2 yrs for normal risk women  Screening for cervical cancer with   Pap (Z01.419 nl or Z01.411abnl) &  HPV Z11.51 Women aged 21 to 72 Once every 5 yrs     Screening pelvic and breast exams All women Annually if high risk. Every 2 yrs for normal risk women     Sexually Transmitted Diseases  Chlamydia  Gonorrhea  Syphilis All at risk adults Annually for non pregnant females at increased risk         Services & Screenings Men Who How Ofter Need  Date of Last Service Action  Prostate Cancer - DRE & PSA Men over 50 Annually.  DRE might require a copay.        Sexually Transmitted Diseases  Syphilis All at risk adults Annually for men at increased risk      Health Maintenance List Health Maintenance  Topic Date Due  . COVID-19 Vaccine (3 - Booster for Pfizer series) 08/17/2020  . COLONOSCOPY (Pts 45-49yrs Insurance coverage will need to be confirmed)  09/21/2021 (Originally 07/21/2009)  . HEMOGLOBIN A1C   12/20/2020  . FOOT EXAM  12/22/2020  . URINE MICROALBUMIN  05/25/2021  . COLON CANCER SCREENING ANNUAL FOBT  07/14/2021  . OPHTHALMOLOGY EXAM  11/18/2021  . TETANUS/TDAP  05/25/2030  . INFLUENZA VACCINE  Completed  . PNEUMOCOCCAL POLYSACCHARIDE VACCINE AGE 85-64 HIGH RISK  Completed  . Hepatitis C Screening  Completed  . HIV Screening  Completed

## 2020-12-19 ENCOUNTER — Other Ambulatory Visit: Payer: Self-pay | Admitting: Internal Medicine

## 2020-12-19 DIAGNOSIS — L718 Other rosacea: Secondary | ICD-10-CM

## 2020-12-21 ENCOUNTER — Ambulatory Visit (INDEPENDENT_AMBULATORY_CARE_PROVIDER_SITE_OTHER): Payer: Medicare Other | Admitting: Internal Medicine

## 2020-12-21 VITALS — BP 137/82 | HR 79 | Temp 97.6°F | Ht 61.0 in | Wt 142.8 lb

## 2020-12-21 DIAGNOSIS — E1129 Type 2 diabetes mellitus with other diabetic kidney complication: Secondary | ICD-10-CM

## 2020-12-21 DIAGNOSIS — K219 Gastro-esophageal reflux disease without esophagitis: Secondary | ICD-10-CM

## 2020-12-21 DIAGNOSIS — R200 Anesthesia of skin: Secondary | ICD-10-CM

## 2020-12-21 DIAGNOSIS — Z7984 Long term (current) use of oral hypoglycemic drugs: Secondary | ICD-10-CM | POA: Diagnosis not present

## 2020-12-21 DIAGNOSIS — I10 Essential (primary) hypertension: Secondary | ICD-10-CM

## 2020-12-21 DIAGNOSIS — R809 Proteinuria, unspecified: Secondary | ICD-10-CM

## 2020-12-21 DIAGNOSIS — G5600 Carpal tunnel syndrome, unspecified upper limb: Secondary | ICD-10-CM | POA: Insufficient documentation

## 2020-12-21 DIAGNOSIS — L718 Other rosacea: Secondary | ICD-10-CM

## 2020-12-21 LAB — POCT GLYCOSYLATED HEMOGLOBIN (HGB A1C): Hemoglobin A1C: 6.6 % — AB (ref 4.0–5.6)

## 2020-12-21 LAB — GLUCOSE, CAPILLARY: Glucose-Capillary: 142 mg/dL — ABNORMAL HIGH (ref 70–99)

## 2020-12-21 NOTE — Assessment & Plan Note (Signed)
BP Readings from Last 3 Encounters:  12/21/20 137/82  09/21/20 (!) 155/81  05/25/20 128/82    Lab Results  Component Value Date   NA 141 09/21/2020   K 4.6 09/21/2020   CREATININE 0.93 09/21/2020    Assessment: Blood pressure control:  Well-controlled Progress toward BP goal:   At goal Comments: Patient is compliant with Toprol-XL 25 mg daily as well as hydrochlorothiazide 12.5 mg daily  Plan: Medications:  continue current medications Educational resources provided:   Self management tools provided:   Other plans: We will check BMP at next visit

## 2020-12-21 NOTE — Assessment & Plan Note (Signed)
Lab Results  Component Value Date   HGBA1C 6.6 (A) 12/21/2020   HGBA1C 6.9 (A) 09/21/2020   HGBA1C 8.3 (A) 05/25/2020     Assessment: Diabetes control:  Well-controlled Progress toward A1C goal:   At goal Comments: Patient is compliant with Metformin 1000 mg twice daily, Januvia 100 mg daily, glipizide 10 mg daily as well as Farxiga 5 mg daily  Plan: Medications:  continue current medications Home glucose monitoring: Frequency:   Timing:   Instruction/counseling given: discussed the need for weight loss Educational resources provided:   Self management tools provided:   Other plans: If patient diabetes continues to remain well controlled will attempt to titrate his Farxiga up and decrease his glipizide with the hopes that we can eventually take him off his glipizide and continue with Marcelline Deist, Januvia and Metformin

## 2020-12-21 NOTE — Assessment & Plan Note (Signed)
-  This problem is chronic and stable -Patient did have a flare of his acne rosacea but has started using the metronidazole gel and this is improved -No further work-up at this time -We will continue to monitor closely

## 2020-12-21 NOTE — Assessment & Plan Note (Signed)
-  This problem is chronic and stable -Patient states that his symptoms have resolved since he started taking the Protonix -We will continue Protonix for now -No further work-up at this time

## 2020-12-21 NOTE — Patient Instructions (Addendum)
-  It was a pleasure seeing you again today -I have put in referral for neurology for your right hand numbness -Your blood pressure is well controlled.  Keep up the great work -Diabetes is also well controlled currently.  Keep up the good work -Please call me if you have any questions or concerns or if you need any refills of medications -Please follow-up with me in 3 months

## 2020-12-21 NOTE — Assessment & Plan Note (Signed)
-  Patient complains of intermittent tingling and numbness in his right hand and fingers over the last couple of months -Patient states that this happens when he is attempting to do the dishes and resolves quickly when he shakes his hand -Patient's grip strength is 5 out of 5 bilaterally and he currently has no tingling or numbness in his hands.  Sensation is intact in his right hand and Tinel's and Phalen sign were negative.   -I do not think that he has carpal tunnel syndrome given his negative Tinel's and Phalen's.  I am unsure of why he has tingling and numbness intermittently in his right hand.  He does not appear to be consistent with diabetic neuropathy either -We will refer patient to neurology for possible nerve conduction and EMG studies

## 2020-12-21 NOTE — Progress Notes (Signed)
   Subjective:    Patient ID: Larry Wiley, male    DOB: Sep 03, 1966, 55 y.o.   MRN: [id number]  HPI  I have seen and examined this patient.  Patient is here for routine follow-up of his hypertension and diabetes.  Patient does complain of some intermittent tingling and numbness in his right hand which he states involves all his fingers especially when he is doing the dishes.  He denies any other complaints at this time states that he is compliant with all his medications.  Review of Systems  Constitutional: Negative.   HENT: Negative.   Respiratory: Negative.   Cardiovascular: Negative.   Gastrointestinal: Negative.   Musculoskeletal: Negative.   Neurological: Positive for numbness.       Patient complains of right hand numbness  Psychiatric/Behavioral: Negative.        Objective:   Physical Exam Constitutional:      Appearance: Normal appearance.  HENT:     Head: Normocephalic and atraumatic.  Cardiovascular:     Rate and Rhythm: Normal rate and regular rhythm.     Heart sounds: Normal heart sounds.  Pulmonary:     Breath sounds: Normal breath sounds. No wheezing or rales.  Abdominal:     General: Bowel sounds are normal. There is no distension.     Palpations: Abdomen is soft.     Tenderness: There is no abdominal tenderness.  Musculoskeletal:        General: No swelling or tenderness.     Cervical back: Neck supple.  Lymphadenopathy:     Cervical: No cervical adenopathy.  Neurological:     Mental Status: He is alert and oriented to person, place, and time.     Comments: -Patient has no tingling or numbness in his hands currently.  No reproduction of tingling or numbness in the right hand on Tinel's or Phalen's.  Grip strength is 5 out of 5 bilaterally  Psychiatric:        Mood and Affect: Mood normal.        Behavior: Behavior normal.           Assessment & Plan:  Please see problem based charting for assessment and plan:

## 2020-12-23 ENCOUNTER — Encounter: Payer: Self-pay | Admitting: Neurology

## 2021-01-14 ENCOUNTER — Other Ambulatory Visit: Payer: Self-pay | Admitting: Internal Medicine

## 2021-01-14 DIAGNOSIS — K219 Gastro-esophageal reflux disease without esophagitis: Secondary | ICD-10-CM

## 2021-01-18 ENCOUNTER — Other Ambulatory Visit: Payer: Self-pay | Admitting: Internal Medicine

## 2021-01-18 DIAGNOSIS — E1129 Type 2 diabetes mellitus with other diabetic kidney complication: Secondary | ICD-10-CM

## 2021-01-21 ENCOUNTER — Other Ambulatory Visit: Payer: Self-pay | Admitting: Internal Medicine

## 2021-01-21 DIAGNOSIS — E1129 Type 2 diabetes mellitus with other diabetic kidney complication: Secondary | ICD-10-CM

## 2021-01-21 DIAGNOSIS — I10 Essential (primary) hypertension: Secondary | ICD-10-CM

## 2021-01-24 ENCOUNTER — Other Ambulatory Visit: Payer: Self-pay

## 2021-01-24 DIAGNOSIS — E1129 Type 2 diabetes mellitus with other diabetic kidney complication: Secondary | ICD-10-CM

## 2021-01-24 MED ORDER — ACCU-CHEK GUIDE W/DEVICE KIT: 1.0000 | PACK | Freq: Every day | 1 refills | Status: AC

## 2021-01-24 NOTE — Telephone Encounter (Signed)
I have put in an order for a new machine. Please let me know if the sister needs anything else

## 2021-01-24 NOTE — Telephone Encounter (Signed)
Sister notified and she is very Adult nurse.

## 2021-01-24 NOTE — Telephone Encounter (Signed)
Pt sister is requesting a call back she is in need of a new  Blood sugar  Machine  She stated that they have change  The batteries  But it just will not come on

## 2021-02-14 ENCOUNTER — Other Ambulatory Visit: Payer: Self-pay | Admitting: Internal Medicine

## 2021-02-14 DIAGNOSIS — L718 Other rosacea: Secondary | ICD-10-CM

## 2021-03-28 ENCOUNTER — Ambulatory Visit (INDEPENDENT_AMBULATORY_CARE_PROVIDER_SITE_OTHER): Payer: Medicare Other | Admitting: Neurology

## 2021-03-28 ENCOUNTER — Other Ambulatory Visit: Payer: Self-pay

## 2021-03-28 ENCOUNTER — Encounter: Payer: Self-pay | Admitting: Neurology

## 2021-03-28 VITALS — BP 133/85 | HR 80 | Ht 62.0 in | Wt 140.8 lb

## 2021-03-28 DIAGNOSIS — R202 Paresthesia of skin: Secondary | ICD-10-CM

## 2021-03-28 DIAGNOSIS — R292 Abnormal reflex: Secondary | ICD-10-CM | POA: Diagnosis not present

## 2021-03-28 DIAGNOSIS — R200 Anesthesia of skin: Secondary | ICD-10-CM | POA: Diagnosis not present

## 2021-03-28 NOTE — Progress Notes (Signed)
[facility] Clinic Note - Initial Visit   Date: 03/28/21  Larry Wiley MRN: [id number] DOB: November 27, 1965   Dear Dr. Dareen Wiley:  Thank you for your kind referral of Larry Wiley for consultation of hand numbness. Although his history is well known to you, please allow [place] to reiterate it for the purpose of our medical record. The patient was accompanied to the clinic by sister Larry Wiley) who also provides collateral information.     History of Present Illness: Larry Wiley is a 55 y.o. right-handed male with well-controlled diabetes mellitus, hypertension, GERD, and hyperlipidemia presenting for evaluation of right hand numbness. He is high-functioning autism and lives with sister and brother-in-law since 2006.   Starting around January 2022, he began having intermittent tingling and numbness of the right hand, which involves all the fingers and entire hand.  It is more noticeable when was drying the dishes or mowing the lawn.  He lasts for a few minutes and often finds that shaking the hands wakes it up. He has weakness with opening jars.  He has not dropped anything.  No neck pain or radicular pain. He denies similar symptoms on the left hand.   He has well-controlled diabetes.  He denies numbness/tinging of the feet.  Balance is good.  No falls or weakness. He walks unassisted.   Out-side paper records, electronic medical record, and images have been reviewed where available and summarized as:  Lab Results  Component Value Date   HGBA1C 6.6 (A) 12/21/2020   No results found for: [id number] Lab Results  Component Value Date   TSH 1.336 08/14/2015   Lab Results  Component Value Date   ESRSEDRATE 15 08/15/2015    Past Medical History:  Diagnosis Date  . Acne rosacea, papular type 02/25/2016  . Bilateral inguinal hernia   . Essential hypertension 08/18/2006  . Gastroesophageal reflux disease 09/27/2012   Intermittent symptoms, does not want therapy   .  Hyperlipidemia LDL goal < 100 05/03/2007  . Mental retardation 08/18/2006  . Open-angle glaucoma 07/25/2013  . Overweight (BMI 25.0-29.9) 09/27/2012  . Type 2 diabetes mellitus with proteinuria or microalbuminuria 08/18/2006    History reviewed. No pertinent surgical history.   Medications:  Outpatient Encounter Medications as of 03/28/2021  Medication Sig  . ACCU-CHEK FASTCLIX LANCETS MISC check blood sugar up to 1 time a day as instructed  . ACCU-CHEK GUIDE test strip CHECK BLOOD SUGAR UP TO 1 TIME A DAY AS INSTRUCTED  . atorvastatin (LIPITOR) 40 MG tablet TAKE 1 TABLET BY MOUTH EVERY DAY  . Blood Glucose Monitoring Suppl (ACCU-CHEK GUIDE) w/Device KIT 1 each by Does not apply route daily. check blood sugar up to 1 time a day as instructed.  . cetirizine (ZYRTEC ALLERGY) 10 MG tablet Take 1 tablet by mouth once daily  . FARXIGA 5 MG TABS tablet TAKE 1 TABLET (5 MG TOTAL) BY MOUTH DAILY BEFORE BREAKFAST.  Marland Kitchen glipiZIDE (GLUCOTROL XL) 10 MG 24 hr tablet TAKE 1 TABLET BY MOUTH TWICE A DAY  . hydrochlorothiazide (MICROZIDE) 12.5 MG capsule TAKE 1 CAPSULE BY MOUTH EVERY DAY  . JANUVIA 100 MG tablet TAKE 1 TABLET BY MOUTH EVERY DAY  . latanoprost (XALATAN) 0.005 % ophthalmic solution Place 1 drop into both eyes at bedtime.  . metFORMIN (GLUCOPHAGE) 1000 MG tablet TAKE 1 TABLET (1,000 MG TOTAL) BY MOUTH 2 (TWO) TIMES DAILY WITH A MEAL.  . metoprolol succinate (TOPROL-XL) 25 MG 24 hr tablet TAKE 1/2 TABLET BY MOUTH  EVERY DAY  . metroNIDAZOLE (METROCREAM) 0.75 % cream APPLY TOPICALLY DAILY. APPLY ONLY TO THE RASH  . pantoprazole (PROTONIX) 20 MG tablet TAKE 1 TABLET BY MOUTH EVERY DAY  . [DISCONTINUED] aspirin EC 81 MG tablet Take 1 tablet (81 mg total) by mouth daily. (Patient not taking: Reported on 10/08/2018)  . [DISCONTINUED] EPINEPHrine 0.3 mg/0.3 mL IJ SOAJ injection Inject 0.3 mLs (0.3 mg total) into the muscle as needed (for difficulty breathing). (Patient not taking: Reported on 03/28/2021)  .  [DISCONTINUED] triamcinolone (NASACORT ALLERGY 24HR) 55 MCG/ACT AERO nasal inhaler Use 1-2 sprays in each nostril daily   No facility-administered encounter medications on file as of 03/28/2021.    Allergies:  Allergies  Allergen Reactions  . Ace Inhibitors Other (See Comments)     angiodema  . Amoxicillin Hives    Has patient had a PCN reaction causing immediate rash, facial/tongue/throat swelling, SOB or lightheadedness with hypotension: Yes Has patient had a PCN reaction causing severe rash involving mucus membranes or skin necrosis: No Has patient had a PCN reaction that required hospitalization No Has patient had a PCN reaction occurring within the last 10 years: Yes If all of the above answers are "NO", then may proceed with Cephalosporin use.  Marland Kitchen Amoxicillin-Pot Clavulanate Hives    Family History: Family History  Problem Relation Age of Onset  . Hyperlipidemia Mother   . Hypertension Mother   . Liver cancer Mother   . Diabetes Father   . Heart attack Father     Social History: Social History   Tobacco Use  . Smoking status: Never Smoker  . Smokeless tobacco: Never Used  Vaping Use  . Vaping Use: Never used  Substance Use Topics  . Alcohol use: No    Alcohol/week: 0.0 standard drinks  . Drug use: No   Social History   Social History Narrative   Lives with sisters who help him with meals, medications, etc.  Has a dog named Larry Wiley (male shepard mix born in 2003) whom he walks around the neighborhood.  Also mows lawn. Right handed     Vital Signs:  BP 133/85   Pulse 80   Ht '5\' 2"'  (1.575 m)   Wt 140 lb 12.8 oz (63.9 kg)   SpO2 95%   BMI 25.75 kg/m   Neurological Exam: MENTAL STATUS including orientation to time, place, person, recent and remote memory, attention span and concentration, language, and fund of knowledge is normal.  Speech is not dysarthric, slow.  CRANIAL NERVES: II:  No visual field defects.     III-IV-VI: Pupils equal round and reactive  to light.  Normal conjugate, extra-ocular eye movements in all directions of gaze.  No nystagmus.  Severe left ptosis (old).   V:  Normal facial sensation.    VII:  Normal facial symmetry and movements.   VIII:  Normal hearing and vestibular function.   IX-X:  Normal palatal movement.   XI:  Normal shoulder shrug and head rotation.   XII:  Normal tongue strength and range of motion, no deviation or fasciculation.  MOTOR:  No atrophy, fasciculations or abnormal movements.  No pronator drift.   Upper Extremity:  Right  Left  Deltoid  5/5   5/5   Biceps  5/5   5/5   Triceps  5/5   5/5   Infraspinatus 5/5  5/5  Medial pectoralis 5/5  5/5  Wrist extensors  5/5   5/5   Wrist flexors  5/5   5/5  Finger extensors  5/5   5/5   Finger flexors  5/5   5/5   Dorsal interossei  5/5   5/5   Abductor pollicis  5/5   5/5   Tone (Ashworth scale)  0  0   Lower Extremity:  Right  Left  Hip flexors  5/5   5/5   Hip extensors  5/5   5/5   Adductor 5/5  5/5  Abductor 5/5  5/5  Knee flexors  5/5   5/5   Knee extensors  5/5   5/5   Dorsiflexors  5/5   5/5   Plantarflexors  5/5   5/5   Toe extensors  5/5   5/5   Toe flexors  5/5   5/5   Tone (Ashworth scale)  0  0   MSRs:  Right        Left                  brachioradialis 3+  3+  biceps 3+  3+  triceps 3+  3+  patellar 2+  2+  ankle jerk 2+  2+  Hoffman no  no  plantar response down  down   SENSORY:  Normal and symmetric perception of light touch, pinprick, vibration, and proprioception.  Romberg's sign absent.   COORDINATION/GAIT: Normal finger-to- nose-finger.  Intact rapid alternating movements bilaterally.Gait narrow based and stable. Tandem and stressed gait intact.    IMPRESSION: Right hand paresthesias, suggestive of carpal tunnel syndrome.  Exam does show hyperreflexia in the arms and if his EMG is not diagnostic, then the next step is to look for cervical pathology with imaging.   - NCS/EMG of the right arm  - Start wearing a  wrist splint at night time  Further recommendations pending results.    Thank you for allowing me to participate in patient's care.  If I can answer any additional questions, I would be pleased to do so.    Sincerely,    Alisan Dokes K. Posey Pronto, DO

## 2021-03-28 NOTE — Patient Instructions (Signed)
Starting wearing a wrist splint when doing activities that trigger your hand to go numb and tingling  Nerve testing of the right arm.  ELECTROMYOGRAM AND NERVE CONDUCTION STUDIES (EMG/NCS) INSTRUCTIONS  How to Prepare The neurologist conducting the EMG will need to know if you have certain medical conditions. Tell the neurologist and other EMG lab personnel if you: . Have a pacemaker or any other electrical medical device . Take blood-thinning medications . Have hemophilia, a blood-clotting disorder that causes prolonged bleeding Bathing Take a shower or bath shortly before your exam in order to remove oils from your skin. Don't apply lotions or creams before the exam.  What to Expect You'll likely be asked to change into a hospital gown for the procedure and lie down on an examination table. The following explanations can help you understand what will happen during the exam.  . Electrodes. The neurologist or a technician places surface electrodes at various locations on your skin depending on where you're experiencing symptoms. Or the neurologist may insert needle electrodes at different sites depending on your symptoms.  . Sensations. The electrodes will at times transmit a tiny electrical current that you may feel as a twinge or spasm. The needle electrode may cause discomfort or pain that usually ends shortly after the needle is removed. If you are concerned about discomfort or pain, you may want to talk to the neurologist about taking a short break during the exam.  . Instructions. During the needle EMG, the neurologist will assess whether there is any spontaneous electrical activity when the muscle is at rest - activity that isn't present in healthy muscle tissue - and the degree of activity when you slightly contract the muscle.  He or she will give you instructions on resting and contracting a muscle at appropriate times. Depending on what muscles and nerves the neurologist is examining,  he or she may ask you to change positions during the exam.  After your EMG You may experience some temporary, minor bruising where the needle electrode was inserted into your muscle. This bruising should fade within several days. If it persists, contact your primary care doctor.

## 2021-03-29 ENCOUNTER — Ambulatory Visit (INDEPENDENT_AMBULATORY_CARE_PROVIDER_SITE_OTHER): Payer: Medicare Other | Admitting: Neurology

## 2021-03-29 ENCOUNTER — Other Ambulatory Visit: Payer: Self-pay

## 2021-03-29 DIAGNOSIS — R200 Anesthesia of skin: Secondary | ICD-10-CM | POA: Diagnosis not present

## 2021-03-29 DIAGNOSIS — R202 Paresthesia of skin: Secondary | ICD-10-CM | POA: Diagnosis not present

## 2021-03-29 DIAGNOSIS — G5621 Lesion of ulnar nerve, right upper limb: Secondary | ICD-10-CM | POA: Diagnosis not present

## 2021-03-29 DIAGNOSIS — G5601 Carpal tunnel syndrome, right upper limb: Secondary | ICD-10-CM | POA: Diagnosis not present

## 2021-03-29 NOTE — Progress Notes (Signed)
Follow-up Visit   Date: 03/29/21   MIGUEL CHRISTIANA MRN: [id number] DOB: [birth year]   Interim History: NAYTHEN HEIKKILA is a 55 y.o. right-handed Caucasian male with well-controlled diabetes mellitus, hypertension, GERD, and hyperlipidemia returning to the clinic for follow-up of right hand numbness.  The patient was accompanied to the clinic by sister who also provides collateral information.    History of present illness: He is high-functioning autism and lives with sister and brother-in-law since 2006.   Starting around January 2022, he began having intermittent tingling and numbness of the right hand, which involves all the fingers and entire hand.  It is more noticeable when was drying the dishes or mowing the lawn.  He lasts for a few minutes and often finds that shaking the hands wakes it up. He has weakness with opening jars.  He has not dropped anything.  No neck pain or radicular pain. He denies similar symptoms on the left hand.   He has well-controlled diabetes.  He denies numbness/tinging of the feet.  Balance is good.  No falls or weakness. He walks unassisted.  He is here for electrodiagnostic testing (see below for results).  Medications:  Current Outpatient Medications on File Prior to Visit  Medication Sig Dispense Refill  . ACCU-CHEK FASTCLIX LANCETS MISC check blood sugar up to 1 time a day as instructed 102 each 5  . ACCU-CHEK GUIDE test strip CHECK BLOOD SUGAR UP TO 1 TIME A DAY AS INSTRUCTED 100 strip 5  . atorvastatin (LIPITOR) 40 MG tablet TAKE 1 TABLET BY MOUTH EVERY DAY 90 tablet 3  . Blood Glucose Monitoring Suppl (ACCU-CHEK GUIDE) w/Device KIT 1 each by Does not apply route daily. check blood sugar up to 1 time a day as instructed. 1 kit 1  . cetirizine (ZYRTEC ALLERGY) 10 MG tablet Take 1 tablet by mouth once daily 30 tablet 5  . FARXIGA 5 MG TABS tablet TAKE 1 TABLET (5 MG TOTAL) BY MOUTH DAILY BEFORE BREAKFAST. 30 tablet 3  . glipiZIDE (GLUCOTROL XL) 10  MG 24 hr tablet TAKE 1 TABLET BY MOUTH TWICE A DAY 180 tablet 3  . hydrochlorothiazide (MICROZIDE) 12.5 MG capsule TAKE 1 CAPSULE BY MOUTH EVERY DAY 90 capsule 3  . JANUVIA 100 MG tablet TAKE 1 TABLET BY MOUTH EVERY DAY 90 tablet 3  . latanoprost (XALATAN) 0.005 % ophthalmic solution Place 1 drop into both eyes at bedtime. 2.5 mL 3  . metFORMIN (GLUCOPHAGE) 1000 MG tablet TAKE 1 TABLET (1,000 MG TOTAL) BY MOUTH 2 (TWO) TIMES DAILY WITH A MEAL. 180 tablet 3  . metoprolol succinate (TOPROL-XL) 25 MG 24 hr tablet TAKE 1/2 TABLET BY MOUTH EVERY DAY 45 tablet 3  . metroNIDAZOLE (METROCREAM) 0.75 % cream APPLY TOPICALLY DAILY. APPLY ONLY TO THE RASH 45 g 1  . pantoprazole (PROTONIX) 20 MG tablet TAKE 1 TABLET BY MOUTH EVERY DAY 90 tablet 1   No current facility-administered medications on file prior to visit.    Allergies:  Allergies  Allergen Reactions  . Ace Inhibitors Other (See Comments)     angiodema  . Amoxicillin Hives    Has patient had a PCN reaction causing immediate rash, facial/tongue/throat swelling, SOB or lightheadedness with hypotension: Yes Has patient had a PCN reaction causing severe rash involving mucus membranes or skin necrosis: No Has patient had a PCN reaction that required hospitalization No Has patient had a PCN reaction occurring within the last 10 years: Yes If all of the above  answers are "NO", then may proceed with Cephalosporin use.  Marland Kitchen Amoxicillin-Pot Clavulanate Hives    Vital Signs:  There were no vitals taken for this visit.   Exam deferred, see note on 03/28/2021   Data: NCS/EMG of the arms 03/29/2021: 1. Right median neuropathy at or distal to the wrist (severe), consistent with a clinical diagnosis of carpal tunnel syndrome.  2. Right ulnar neuropathy with slowing across the elbow, purely demyelinating, moderate.   IMPRESSION/PLAN: 1. Right carpal tunnel syndrome, severe  - Results of EMG discussed with patient and sister which shows severe CTS  -  We will offer a trial of conservative therapy with using wrist splints at nighttime and during periods of repetitive activity  - If no improvement over the next 6-8 weeks, he will need referral for surgical evaluation  2. Right ulnar neuropathy at the elbow, mild.  He seems asymptomatic.  - Strategies to minimize nerve compression/stretching at the elbow discussed  3. Hyperreflexia probably due to cervical canal stenosis.  He currently denies any neck pain, cramps, or radicular pain  - EMG without signs of cervical radiculopathy  - Low threshold to obtain MRI cervical spine, if symptoms get worse  Return to clinic in 2 months.   Thank you for allowing me to participate in patient's care.  If I can answer any additional questions, I would be pleased to do so.    Sincerely,    Ashanta Amoroso K. Posey Pronto, DO

## 2021-03-29 NOTE — Procedures (Signed)
[facility]  [address] [place], [address]  [place], [place] [address] Tel: [phone] Fax:  [phone] Test Date:  03/29/2021  Patient: Larry Wiley DOB: 1966-01-03 Physician: Nita Sickle, DO  Sex: Male Height: 5\' 2"  Ref Phys: , DO  ID#: Nita Sickle   Technician:    Patient Complaints: This is a 55 year old man referred for evaluation of right hand numbness and tingling.  NCV & EMG Findings: Extensive electrodiagnostic testing of the right upper extremity shows:  1. Right median sensory response is absent.  Right ulnar sensory responses within normal limits. 2. Right median motor response shows prolonged latency (5.5 ms) and reduced amplitude (2.1 mV).  There is evidence of a right Martin-Gruber anastomoses, a normal anatomic variant.  Right ulnar motor response shows slowed conduction velocity across the elbow (A Elbow-B Elbow, 33 m/s).   3. Chronic motor axonal loss changes are seen in the right abductor pollicis brevis muscle, without accompanying active denervation.    Impression: 1. Right median neuropathy at or distal to the wrist (severe), consistent with a clinical diagnosis of carpal tunnel syndrome.  2. Right ulnar neuropathy with slowing across the elbow, purely demyelinating, moderate.   ___________________________ 57, DO    Nerve Conduction Studies Anti Sensory Summary Table   Stim Site NR Peak (ms) Norm Peak (ms) P-T Amp (V) Norm P-T Amp  Right Median Anti Sensory (2nd Digit)  34C  Wrist NR  <3.6  >15  Right Ulnar Anti Sensory (5th Digit)  34C  Wrist    2.6 <3.1 15.7 >10   Motor Summary Table   Stim Site NR Onset (ms) Norm Onset (ms) O-P Amp (mV) Norm O-P Amp Site1 Site2 Delta-0 (ms) Dist (cm) Vel (m/s) Norm Vel (m/s)  Right Median Motor (Abd Poll Brev)  34C  Wrist    5.5 <4.0 2.1 >6 Elbow Wrist 3.1 24.0 77 >50  Elbow    8.6  2.3  Ulnar-wrist Elbow 4.7 0.0    Ulnar-wrist    3.9  4.9         Right Ulnar Motor (Abd Dig  Minimi)  34C  Wrist    2.1 <3.1 9.1 >7 B Elbow Wrist 2.8 20.0 71 >50  B Elbow    4.9  8.8  A Elbow B Elbow 3.0 10.0 33 >50  A Elbow    7.9  7.5          EMG   Side Muscle Ins Act Fibs Psw Fasc Number Recrt Dur Dur. Amp Amp. Poly Poly. Comment  Right 1stDorInt Nml Nml Nml Nml Nml Nml Nml Nml Nml Nml Nml Nml N/A  Right Abd Poll Brev Nml Nml Nml Nml 3- Rapid Most 1+ Most 1+ Most 1+ ATR  Right PronatorTeres Nml Nml Nml Nml Nml Nml Nml Nml Nml Nml Nml Nml N/A  Right Biceps Nml Nml Nml Nml Nml Nml Nml Nml Nml Nml Nml Nml N/A  Right Triceps Nml Nml Nml Nml Nml Nml Nml Nml Nml Nml Nml Nml N/A  Right Deltoid Nml Nml Nml Nml Nml Nml Nml Nml Nml Nml Nml Nml N/A  Right ABD Dig Min Nml Nml Nml Nml Nml Nml Nml Nml Nml Nml Nml Nml N/A  Right FlexDigProf 4,5 Nml Nml Nml Nml Nml Nml Nml Nml Nml Nml Nml Nml N/A      Waveforms:

## 2021-04-21 ENCOUNTER — Other Ambulatory Visit: Payer: Self-pay

## 2021-04-21 DIAGNOSIS — E1129 Type 2 diabetes mellitus with other diabetic kidney complication: Secondary | ICD-10-CM

## 2021-04-21 MED ORDER — DAPAGLIFLOZIN PROPANEDIOL 5 MG PO TABS
5.0000 mg | ORAL_TABLET | Freq: Every day | ORAL | 3 refills | Status: DC
Start: 2021-04-21 — End: 2021-08-02

## 2021-04-26 ENCOUNTER — Encounter: Payer: Self-pay | Admitting: *Deleted

## 2021-04-29 ENCOUNTER — Other Ambulatory Visit: Payer: Self-pay | Admitting: Internal Medicine

## 2021-04-29 DIAGNOSIS — Z23 Encounter for immunization: Secondary | ICD-10-CM | POA: Diagnosis not present

## 2021-04-29 DIAGNOSIS — L718 Other rosacea: Secondary | ICD-10-CM

## 2021-05-06 ENCOUNTER — Other Ambulatory Visit: Payer: Self-pay | Admitting: Internal Medicine

## 2021-05-06 DIAGNOSIS — I10 Essential (primary) hypertension: Secondary | ICD-10-CM

## 2021-05-06 DIAGNOSIS — E1129 Type 2 diabetes mellitus with other diabetic kidney complication: Secondary | ICD-10-CM

## 2021-05-06 DIAGNOSIS — R809 Proteinuria, unspecified: Secondary | ICD-10-CM

## 2021-05-24 ENCOUNTER — Other Ambulatory Visit: Payer: Self-pay | Admitting: Internal Medicine

## 2021-05-24 DIAGNOSIS — E785 Hyperlipidemia, unspecified: Secondary | ICD-10-CM

## 2021-05-30 ENCOUNTER — Other Ambulatory Visit: Payer: Self-pay

## 2021-05-30 ENCOUNTER — Ambulatory Visit (INDEPENDENT_AMBULATORY_CARE_PROVIDER_SITE_OTHER): Payer: Medicare Other | Admitting: Neurology

## 2021-05-30 ENCOUNTER — Encounter: Payer: Self-pay | Admitting: Neurology

## 2021-05-30 VITALS — BP 135/87 | HR 80 | Ht 62.0 in | Wt 141.6 lb

## 2021-05-30 DIAGNOSIS — G5621 Lesion of ulnar nerve, right upper limb: Secondary | ICD-10-CM | POA: Diagnosis not present

## 2021-05-30 DIAGNOSIS — G5601 Carpal tunnel syndrome, right upper limb: Secondary | ICD-10-CM

## 2021-05-30 NOTE — Progress Notes (Signed)
Follow-up Visit   Date: 05/30/21   Larry Wiley MRN: [id number] DOB: 1966/04/01   Interim History: Larry Wiley is a 56 y.o. right-handed Caucasian male with well-controlled diabetes mellitus, hypertension, GERD, and hyperlipidemia returning to the clinic for follow-up of right hand numbness.  The patient was accompanied to the clinic by sister who also provides collateral information.    History of present illness: He is high-functioning autism and lives with sister and brother-in-law since 2006.    Starting around January 2022, he began having intermittent tingling and numbness of the right hand, which involves all the fingers and entire hand.  It is more noticeable when was drying the dishes or mowing the lawn.  He lasts for a few minutes and often finds that shaking the hands wakes it up. He has weakness with opening jars.  He has not dropped anything.  No neck pain or radicular pain. He denies similar symptoms on the left hand.    He has well-controlled diabetes.  He denies numbness/tinging of the feet.  Balance is good.  No falls or weakness. He walks unassisted.  UPDATE 05/30/2021:  He is here for follow-up visit.  Since using his wrist brace, his right hand tingling occurs much less frequent.  He wear it when he is mowing the lawn.  He does not use it at bedtime.  His sister has also observed that he is shaking his right hand less.  No new complaints.   Medications:  Current Outpatient Medications on File Prior to Visit  Medication Sig Dispense Refill   ACCU-CHEK FASTCLIX LANCETS MISC check blood sugar up to 1 time a day as instructed 102 each 5   ACCU-CHEK GUIDE test strip CHECK BLOOD SUGAR UP TO 1 TIME A DAY AS INSTRUCTED 100 strip 5   atorvastatin (LIPITOR) 40 MG tablet TAKE 1 TABLET BY MOUTH EVERY DAY 90 tablet 3   Blood Glucose Monitoring Suppl (ACCU-CHEK GUIDE) w/Device KIT 1 each by Does not apply route daily. check blood sugar up to 1 time a day as instructed. 1 kit 1    cetirizine (ZYRTEC ALLERGY) 10 MG tablet Take 1 tablet by mouth once daily 30 tablet 5   dapagliflozin propanediol (FARXIGA) 5 MG TABS tablet Take 1 tablet (5 mg total) by mouth daily before breakfast. 90 tablet 3   glipiZIDE (GLUCOTROL XL) 10 MG 24 hr tablet TAKE 1 TABLET BY MOUTH TWICE A DAY 180 tablet 3   hydrochlorothiazide (MICROZIDE) 12.5 MG capsule TAKE 1 CAPSULE BY MOUTH EVERY DAY 90 capsule 3   JANUVIA 100 MG tablet TAKE 1 TABLET BY MOUTH EVERY DAY 90 tablet 3   metFORMIN (GLUCOPHAGE) 1000 MG tablet TAKE 1 TABLET (1,000 MG TOTAL) BY MOUTH 2 (TWO) TIMES DAILY WITH A MEAL. 180 tablet 3   metoprolol succinate (TOPROL-XL) 25 MG 24 hr tablet TAKE 1/2 TABLET BY MOUTH EVERY DAY 45 tablet 3   metroNIDAZOLE (METROCREAM) 0.75 % cream APPLY TOPICALLY DAILY. APPLY ONLY TO THE RASH 45 g 1   pantoprazole (PROTONIX) 20 MG tablet TAKE 1 TABLET BY MOUTH EVERY DAY 90 tablet 1   latanoprost (XALATAN) 0.005 % ophthalmic solution Place 1 drop into both eyes at bedtime. 2.5 mL 3   No current facility-administered medications on file prior to visit.    Allergies:  Allergies  Allergen Reactions   Ace Inhibitors Other (See Comments)     angiodema   Amoxicillin Hives    Has patient had a PCN reaction causing  immediate rash, facial/tongue/throat swelling, SOB or lightheadedness with hypotension: Yes Has patient had a PCN reaction causing severe rash involving mucus membranes or skin necrosis: No Has patient had a PCN reaction that required hospitalization No Has patient had a PCN reaction occurring within the last 10 years: Yes If all of the above answers are "NO", then may proceed with Cephalosporin use.   Amoxicillin-Pot Clavulanate Hives    Vital Signs:  BP 135/87   Pulse 80   Ht _0  (1.575 m)   Wt 141 lb 9.6 oz (64.2 kg)   SpO2 96%   BMI 25.90 kg/m   Neurological Exam: MENTAL STATUS including orientation to time, place, person, recent and remote memory, attention span and concentration,  language, and fund of knowledge is normal.  Speech is not dysarthric.  CRANIAL NERVES:  Pupils equal round and reactive to light.  Normal conjugate, extra-ocular eye movements in all directions of gaze.  Left ptosis (old).   MOTOR:  Motor strength is 5/5 in all extremities.  Mild right ABP atrophy. No fasciculations or abnormal movements.  No pronator drift.  Tone is normal.    MSRs:  Reflexes are 2+/4 throughout,.  SENSORY:  Intact to vibration and temperature.  COORDINATION/GAIT:   Gait narrow based and stable.    Data: NCS/EMG of the arms 03/29/2021: Right median neuropathy at or distal to the wrist (severe), consistent with a clinical diagnosis of carpal tunnel syndrome.  Right ulnar neuropathy with slowing across the elbow, purely demyelinating, moderate.    IMPRESSION/PLAN: 1. Right carpal tunnel syndrome, severe By EMG.    - He has been using wrist brace when doing activities, which has significantly helped  - Continue to use wrist brace  - If symptoms get worse, we will refer to hand orthopaedics   2. Right ulnar neuropathy at the elbow, mild.  He seems asymptomatic.  - Strategies to minimize nerve compression/stretching at the elbow discussed  Return to clinic in 6 months  Thank you for allowing me to participate in patient's care.  If I can answer any additional questions, I would be pleased to do so.    Sincerely,    Nasier Thumm K. Posey Pronto, DO

## 2021-05-30 NOTE — Patient Instructions (Signed)
Return to clinic in 6 months.

## 2021-07-06 ENCOUNTER — Other Ambulatory Visit: Payer: Self-pay | Admitting: Internal Medicine

## 2021-07-06 DIAGNOSIS — L718 Other rosacea: Secondary | ICD-10-CM

## 2021-07-07 NOTE — Telephone Encounter (Signed)
Next appt scheduled 08/02/21 with PCP. 

## 2021-07-16 ENCOUNTER — Other Ambulatory Visit: Payer: Self-pay | Admitting: Internal Medicine

## 2021-07-16 DIAGNOSIS — K219 Gastro-esophageal reflux disease without esophagitis: Secondary | ICD-10-CM

## 2021-07-18 NOTE — Telephone Encounter (Signed)
Next appt scheduled 08/02/21 with PCP. 

## 2021-08-02 ENCOUNTER — Ambulatory Visit (INDEPENDENT_AMBULATORY_CARE_PROVIDER_SITE_OTHER): Payer: Medicare Other | Admitting: Internal Medicine

## 2021-08-02 VITALS — BP 130/73 | HR 86 | Temp 98.1°F | Ht 62.0 in | Wt 141.9 lb

## 2021-08-02 DIAGNOSIS — Z23 Encounter for immunization: Secondary | ICD-10-CM | POA: Diagnosis not present

## 2021-08-02 DIAGNOSIS — Z0000 Encounter for general adult medical examination without abnormal findings: Secondary | ICD-10-CM

## 2021-08-02 DIAGNOSIS — G5601 Carpal tunnel syndrome, right upper limb: Secondary | ICD-10-CM

## 2021-08-02 DIAGNOSIS — I10 Essential (primary) hypertension: Secondary | ICD-10-CM

## 2021-08-02 DIAGNOSIS — R5383 Other fatigue: Secondary | ICD-10-CM | POA: Insufficient documentation

## 2021-08-02 DIAGNOSIS — E1129 Type 2 diabetes mellitus with other diabetic kidney complication: Secondary | ICD-10-CM

## 2021-08-02 DIAGNOSIS — E7800 Pure hypercholesterolemia, unspecified: Secondary | ICD-10-CM | POA: Diagnosis not present

## 2021-08-02 DIAGNOSIS — R809 Proteinuria, unspecified: Secondary | ICD-10-CM | POA: Diagnosis not present

## 2021-08-02 HISTORY — DX: Other fatigue: R53.83

## 2021-08-02 LAB — POCT GLYCOSYLATED HEMOGLOBIN (HGB A1C): Hemoglobin A1C: 6.9 % — AB (ref 4.0–5.6)

## 2021-08-02 LAB — GLUCOSE, CAPILLARY: Glucose-Capillary: 154 mg/dL — ABNORMAL HIGH (ref 70–99)

## 2021-08-02 MED ORDER — DAPAGLIFLOZIN PROPANEDIOL 10 MG PO TABS: 10.0000 mg | ORAL_TABLET | Freq: Every day | ORAL | 1 refills | Status: DC

## 2021-08-02 NOTE — Assessment & Plan Note (Addendum)
Continues to be well-controlled and at goal of HgbA1c <7. HgbA1c 6.9 today, 6.6 on 12/21/20. Patient and sister deny any dizziness, polyuria, polydipsia, or hypoglycemic episodes, although do report an increase in fatigue in the last 2-3 weeks. Possible that fatigue is secondary to hypoglycemia. Patient has been tolerating [place] 5mg  well, denies GI side effects. Will plan to discontinue glipizide due to risk of hypoglycemia and increase Farxiga to 10mg  daily. Patient denies peripheral neuropathy and foot exam today is normal.   Plan:  -Discontinue glipizide  -Increase Farxiga to 10mg  daily  -Continue metformin 1000mg  BID and Januvia 100mg  daily  - Will check urine microalbumin:creatinine ratio today -Follow-up in 3 months

## 2021-08-02 NOTE — Assessment & Plan Note (Signed)
Patient's sister notes that in the past 2-3 weeks, Ja has seemed more tired than usual, and Deshane agrees. He has not been sick recently and his sleep schedule remains the same, sleeping from about 11:30 PM - 9 AM. STOP-Bang questionnaire is notable for score of 3, indicated intermediate risk of OSA. Riley and his sister deny that he snores, falls asleep during the day, or wakes up at night choking/gasping. We will obtain CBC and TSH today to assess for potential anemia or hypothyroidism as etiology of his fatigue.   Plan:  -CBC and TSH results pending -Follow-up in 3 months

## 2021-08-02 NOTE — Patient Instructions (Signed)
It was a pleasure to see you! Today we discussed your diabetes, blood pressure, carpal tunnel, and tiredness.   Diabetes - Your A1c today was 6.9%, which is at the goal of under 7%. Please stop taking glipizide and increase Farxiga to 10 mg daily.   2. Blood pressure - Your blood pressure today was 130/73, which is great. Please continue to take metoprolol and hydrochlorothiazide.   3. Carpal tunnel - We are glad to hear that this is no longer bothering you! Let us know if it flares back up or if you start to notice weakness and begin dropping things, and continue to use the wrist brace.   4. Tiredness - We will get blood work today to see if we can find any causes of your recent tiredness.   I have ordered the following labs for you:  Lab Orders         Glucose, capillary         CBC no Diff         BMP8+Anion Gap         Lipid Profile         TSH         Microalbumin / Creatinine Urine Ratio         POC Hbg A1C       I have ordered the following medication/changed the following medications:   Stop the following medications: Medications Discontinued During This Encounter  Medication Reason   glipiZIDE (GLUCOTROL XL) 10 MG 24 hr tablet Discontinued by provider     Start the following medications: Meds ordered this encounter  Medications   dapagliflozin propanediol (FARXIGA) 10 MG TABS tablet    Sig: Take 1 tablet (10 mg total) by mouth daily before breakfast.    Dispense:  90 tablet    Refill:  1     Follow up: 3 months   Should you have any questions or concerns please call the internal medicine clinic at [phone].    Earl Lagos, MD  [facility]  [facility]

## 2021-08-02 NOTE — Assessment & Plan Note (Signed)
Vitals:   08/02/21 0933  BP: 130/73   BP well-controlled and at goal, continue current regimen of metoprolol 12.5mg  daily and HCTZ 12.5mg  daily. BMP ordered today.   Plan:  -Continue current regimen  -BMP results pending

## 2021-08-02 NOTE — Assessment & Plan Note (Addendum)
Patient was seen by Dr. Allena Katz at [facility] in June and underwent EMG which revealed severe carpal tunnel syndrome in his right wrist. He was also diagnosed with right ulnar neuropathy, but that appears to be asymptomatic at this time. He now uses a wrist splint when helping with household tasks such as yard work and drying dishes which appears to have helped. His sister states he has not complained about numbness or tingling for about a month. Instructed them to contact us if numbness or tingling returns or if they notice weakness in his hand.   Plan:  -Follow-up as needed

## 2021-08-02 NOTE — Assessment & Plan Note (Signed)
Flu shot administered today. Patient will get updated covid vaccine with his family soon.   Needs cologuard at next visit.

## 2021-08-02 NOTE — Assessment & Plan Note (Signed)
Continue atorvastatin 40mg  daily. Checking lipid panel today.

## 2021-08-02 NOTE — Progress Notes (Signed)
HPI:  [name] is a 55 y.o. male with a history of hypertension, type II diabetes, GERD, hyperlipidemia, and autism/intellectual disability who presents for routine follow-up. He is accompanied by his sister, Larry Wiley.   He was seen by Dr. Allena Katz at [facility] in June and diagnosed with severe carpal tunnel syndrome in his right wrist. He now uses a wrist splint when helping with household tasks such as yard work and drying dishes which appears to have helped. Larry Wiley states he has not complained about numbness or tingling for about a month.   They both deny any side effects since starting Farxiga in March, including nausea, abdominal pain, constipation, or diarrhea. Larry Wiley denies feeling numbness or tingling in his feet. They check his blood sugar a few times a month and deny any episodes of hypoglycemia.   Larry Wiley notes that in the past 2-3 weeks, Larry Wiley has seemed more tired than usual, and Larry Wiley agrees. He has not been sick recently and his sleep schedule remains the same, sleeping from about 11:30 PM - 9 AM.   They request his flu shot today and plan on getting the bivalent COVID vaccine soon from [facility].     Physical Exam:  Vitals:   08/02/21 0933  BP: 130/73  Pulse: 86  Temp: 98.1 F (36.7 C)  TempSrc: Oral  SpO2: 100%  Weight: 141 lb 14.4 oz (64.4 kg)  Height: 5\' 2"  (1.575 m)   Gen: Pleasant, in no acute distress CV: RRR, no murmurs heard  Pulm: Normal WOB, lungs CTAB.  Abd: Normal bowel sounds heard. Soft, non-distended. No tenderness.  MSK: Grip strength 5/5 bilaterally. Negative Tinel's sign.  Psych: Normal mood and affect  Assessment & Plan:   See Encounters Tab for problem based charting.  Patient seen with Dr.  Problem List Items Addressed This Visit       Cardiovascular and Mediastinum   Essential hypertension (Chronic)    Vitals:   08/02/21 0933  BP: 130/73  BP well-controlled and at goal, continue current regimen of metoprolol 12.5mg  daily and  HCTZ 12.5mg  daily. BMP ordered today.   Plan:  -Continue current regimen  -BMP results pending         Endocrine   Type 2 diabetes mellitus with microalbuminuria, without long-term current use of insulin (HCC) - Primary (Chronic)    Continues to be well-controlled and at goal of HgbA1c <7. HgbA1c 6.9 today, 6.6 on 12/21/20. Patient and sister deny any dizziness, polyuria, polydipsia, or hypoglycemic episodes, although do report an increase in fatigue in the last 2-3 weeks. Possible that fatigue is secondary to hypoglycemia. Patient has been tolerating 02/20/21 5mg  well, denies GI side effects. Will plan to discontinue glipizide due to risk of hypoglycemia and increase Farxiga to 10mg  daily. Patient denies peripheral neuropathy and foot exam today is normal.   Plan:  -Discontinue glipizide  -Increase Farxiga to 10mg  daily  -Continue metformin 1000mg  BID and Januvia 100mg  daily  -Follow-up in 3 months       Relevant Medications   dapagliflozin propanediol (FARXIGA) 10 MG TABS tablet   Other Relevant Orders   POC Hbg A1C (Completed)   BMP8+Anion Gap   Microalbumin / Creatinine Urine Ratio     Nervous and Auditory   Carpal tunnel syndrome    Patient was seen by Dr. [place] at [facility] in June and underwent EMG which revealed severe carpal tunnel syndrome in his right wrist. He was also diagnosed with right ulnar neuropathy, but  that appears to be asymptomatic at this time. He now uses a wrist splint when helping with household tasks such as yard work and drying dishes which appears to have helped. His sister states he has not complained about numbness or tingling for about a month. Instructed them to contact us if numbness or tingling returns or if they notice weakness in his hand.   Plan:  -Follow-up as needed         Other   Hyperlipidemia (Chronic)    Continue atorvastatin 40mg  daily. Checking lipid panel today.       Relevant Orders   Lipid Profile   Fatigue     Patient's sister notes that in the past 2-3 weeks, Larry Wiley has seemed more tired than usual, and Larry Wiley agrees. He has not been sick recently and his sleep schedule remains the same, sleeping from about 11:30 PM - 9 AM. STOP-Bang questionnaire is notable for score of 3, indicated intermediate risk of OSA. Larry Wiley and his sister deny that he snores, falls asleep during the day, or wakes up at night choking/gasping. We will obtain CBC and TSH today to assess for potential anemia or hypothyroidism as etiology of his fatigue.   Plan:  -CBC and TSH results pending -Follow-up in 3 months       Relevant Orders   CBC no Diff   TSH   Healthcare maintenance    Flu shot administered today. Patient will get updated covid vaccine with his family soon.   Needs cologuard at next visit.       Other Visit Diagnoses     Need for immunization against influenza       Relevant Orders   Flu Vaccine QUAD 59mo+IM (Fluarix, Fluzone & Alfiuria Quad PF) (Completed)       Return in about 3 months (around 11/02/2021).

## 2021-08-03 ENCOUNTER — Telehealth: Payer: Self-pay | Admitting: Internal Medicine

## 2021-08-03 LAB — CBC
Hematocrit: 45.3 % (ref 37.5–51.0)
Hemoglobin: 15.1 g/dL (ref 13.0–17.7)
MCH: 27.6 pg (ref 26.6–33.0)
MCHC: 33.3 g/dL (ref 31.5–35.7)
MCV: 83 fL (ref 79–97)
Platelets: 163 10*3/uL (ref 150–450)
RBC: 5.48 x10E6/uL (ref 4.14–5.80)
RDW: 14 % (ref 11.6–15.4)
WBC: 5.2 10*3/uL (ref 3.4–10.8)

## 2021-08-03 LAB — BMP8+ANION GAP
Anion Gap: 18 mmol/L (ref 10.0–18.0)
BUN/Creatinine Ratio: 16 (ref 9–20)
BUN: 14 mg/dL (ref 6–24)
CO2: 20 mmol/L (ref 20–29)
Calcium: 9.7 mg/dL (ref 8.7–10.2)
Chloride: 99 mmol/L (ref 96–106)
Creatinine, Ser: 0.89 mg/dL (ref 0.76–1.27)
Glucose: 154 mg/dL — ABNORMAL HIGH (ref 70–99)
Potassium: 4.1 mmol/L (ref 3.5–5.2)
Sodium: 137 mmol/L (ref 134–144)
eGFR: 100 mL/min/{1.73_m2} (ref >59)

## 2021-08-03 LAB — LIPID PANEL
Chol/HDL Ratio: 3.7 ratio (ref 0.0–5.0)
Cholesterol, Total: 97 mg/dL — ABNORMAL LOW (ref 100–199)
HDL: 26 mg/dL — ABNORMAL LOW (ref >39)
LDL Chol Calc (NIH): 45 mg/dL (ref 0–99)
Triglycerides: 152 mg/dL — ABNORMAL HIGH (ref 0–149)
VLDL Cholesterol Cal: 26 mg/dL (ref 5–40)

## 2021-08-03 LAB — TSH: TSH: 2.65 u[IU]/mL (ref 0.450–4.500)

## 2021-08-03 LAB — MICROALBUMIN / CREATININE URINE RATIO
Creatinine, Urine: 60.8 mg/dL
Microalb/Creat Ratio: 28 mg/g creat (ref 0–29)
Microalbumin, Urine: 17.3 ug/mL

## 2021-08-03 NOTE — Telephone Encounter (Signed)
I called the patient to discuss results of the blood work with him.  I did speak with the patient's sister who looks after him.  Patient's BMP was within normal limits except for mildly elevated blood glucose.  Patient's LDL is at goal but he has a decreased HDL of 26 and an elevated triglyceride of 152.  We will continue with atorvastatin 40 mg daily for now.  Patient's CBC and TSH are within normal limits.  Sister states that his fatigue appears to be improving.  No further work-up at this time.  Sister expressed understanding and is in agreement with plan.

## 2021-09-30 DIAGNOSIS — Z23 Encounter for immunization: Secondary | ICD-10-CM | POA: Diagnosis not present

## 2021-10-05 ENCOUNTER — Other Ambulatory Visit: Payer: Self-pay | Admitting: Internal Medicine

## 2021-10-05 DIAGNOSIS — E1129 Type 2 diabetes mellitus with other diabetic kidney complication: Secondary | ICD-10-CM

## 2021-10-31 ENCOUNTER — Ambulatory Visit (INDEPENDENT_AMBULATORY_CARE_PROVIDER_SITE_OTHER): Payer: Medicare Other | Admitting: Pharmacist

## 2021-10-31 ENCOUNTER — Encounter: Payer: Self-pay | Admitting: Pharmacist

## 2021-10-31 VITALS — BP 111/79 | Wt 133.0 lb

## 2021-10-31 DIAGNOSIS — Z0000 Encounter for general adult medical examination without abnormal findings: Secondary | ICD-10-CM | POA: Diagnosis not present

## 2021-10-31 NOTE — Progress Notes (Signed)
This AWV is being conducted by TELEHEALTH - AUDIO only. The patient was located at home and I was located in [facility]. The patient's identity was confirmed using their DOB and current address. The patient or his/her legal guardian has consented to being evaluated through a telephone encounter and understands the associated risks (an examination cannot be done and the patient may need to come in for an appointment) / benefits (allows the patient to remain at home, decreasing exposure to coronavirus). I personally spent 29 minutes conducting the AWV.  Subjective:   Larry Wiley is a 56 y.o. male who presents for a Medicare Annual Wellness Visit.  The following items have been reviewed and updated today in the appropriate area in the EMR.   Health Risk Assessment  Height, weight, BMI, and BP Visual acuity if needed Depression screen Fall risk / safety level Advance directive discussion Medical and family history were reviewed and updated Updating list of other providers & suppliers Medication reconciliation, including over the counter medicines Cognitive screen Written screening schedule Risk Factor list Personalized health advice, risky behaviors, and treatment advice  Social History   Social History Narrative   Current Social History 10/31/2021        Patient lives with his sister in a home which is 1 story. There are 4 steps up to the entrance the patient uses with a railing      Patient's method of transportation is via family member.      The highest level of education was some high school.      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      Identified important Relationships are "his family"      Pets : 0       Interests / Fun: "Sports, watch TV, car shows"       Current Stressors: "none"       Religious / Personal Beliefs: "none"             Objective:    Vitals: BP 111/79    Wt 133 lb (60.3 kg)    BMI 24.33 kg/m  Vitals are patient  reported  Activities of Daily Living In your present state of health, do you have any difficulty performing the following activities: 08/02/2021 12/21/2020  Hearing? Y Y  Comment patient has hearing aids patient has hearing aids  Vision? N N  Difficulty concentrating or making decisions? N N  Walking or climbing stairs? N N  Dressing or bathing? N N  Doing errands, shopping? N N  Some recent data might be hidden    Goals  Goals      Blood Pressure < 140/90     HEMOGLOBIN A1C < 7.0     Weight < 136 lb (61.689 kg)        Fall Risk Fall Risk  10/31/2021 08/02/2021 05/30/2021 03/28/2021 12/21/2020  Falls in the past year? 0 0 0 0 0  Number falls in past yr: 0 0 0 0 0  Injury with Fall? 0 0 0 0 0  Risk for fall due to : No Fall Risks - - - -  Follow up Education provided;Falls prevention discussed Falls evaluation completed - - -    Depression Screen PHQ 2/9 Scores 10/31/2021 08/02/2021 12/21/2020 09/21/2020  PHQ - 2 Score 1 0 0 0  PHQ- 9 Score - - - 0  Exception Documentation - - - -     Cognitive Testing Six-Item Cognitive Screener   "I  would like to ask you some questions that ask you to use your memory. I am going to name three objects. Please wait until I say all three words, then repeat them. Remember what they are  because I am going to ask you to name them again in a few minutes. Please repeat these words for me: APPLE--TABLE--PENNY. (Interviewer may repeat names 3 times if necessary but repetition not scored.)  Did patient correctly repeat all three words? Yes - may proceed with screen  What year is this? Correct What month is this? Correct What day of the week is this? Correct  What were the three objects I asked you to remember? Apple Correct Table Correct Penny Correct  Score one point for each incorrect answer.  A score of 2 or more points warrants additional investigation.  Patient's score 0     Assessment and Plan:    During the course of the visit the  patient was educated and counseled about appropriate screening and preventive services as documented in the assessment and plan.  Recommended patient obtain Shingles and pneumonia vaccines.  Patient states he did colon cancer screening but I was unable to find results in chart. Recommended discussing with PCP at next visit.  The printed AVS was given to the patient and included an updated screening schedule, a list of risk factors, and personalized health advice.        Cheral Almas, RPH-CPP  10/31/2021

## 2021-10-31 NOTE — Patient Instructions (Addendum)
Annual Wellness Visit   Medicare Covered Preventative Screenings and Services  Services & Screenings Men and Women Who How Often Need? Date of Last Service Action  Abdominal Aortic Aneurysm Adults with AAA risk factors Once     Alcohol Misuse and Counseling All Adults Screening once a year if no alcohol misuse. Counseling up to 4 face to face sessions.     Bone Density Measurement  Adults at risk for osteoporosis Once every 2 yrs     Lipid Panel Z13.6 All adults without CV disease Once every 5 yrs     Colorectal Cancer  Stool sample or Colonoscopy All adults 75 and older  Once every year Every 10 years     Depression All Adults Once a year  Today   Diabetes Screening Blood glucose, post glucose load, or GTT Z13.1 All adults at risk Pre-diabetics Once per year Twice per year     Diabetes  Self-Management Training All adults Diabetics 10 hrs first year; 2 hours subsequent years. Requires Copay     Glaucoma Diabetics Family history of glaucoma African Americans 59 yrs + Hispanic Americans 48 yrs + Annually - requires coppay     Hepatitis C Z72.89 or F19.20 High Risk for HCV Born between 1945 and 1965 Annually Once     HIV Z11.4 All adults based on risk Annually btw ages 33 & 36 regardless of risk Annually > 65 yrs if at increased risk     Lung Cancer Screening Asymptomatic adults aged 58-77 with 30 pack yr history and current smoker OR quit within the last 15 yrs Annually Must have counseling and shared decision making documentation before first screen     Medical Nutrition Therapy Adults with  Diabetes Renal disease Kidney transplant within past 3 yrs 3 hours first year; 2 hours subsequent years     Obesity and Counseling All adults Screening once a year Counseling if BMI 30 or higher  Today   Tobacco Use Counseling Adults who use tobacco  Up to 8 visits in one year     Vaccines Z23 Hepatitis B Influenza  Pneumonia  Adults  Once Once every flu season Two different  vaccines separated by one year     Next Annual Wellness Visit People with Medicare Every year  Today     Services & Screenings Women Who How Often Need  Date of Last Service Action  Mammogram  Z12.31 Women over 13 One baseline ages 78-39. Annually ager 40 yrs+     Pap tests All women Annually if high risk. Every 2 yrs for normal risk women     Screening for cervical cancer with  Pap (Z01.419 nl or Z01.411abnl) & HPV Z11.51 Women aged 40 to 37 Once every 5 yrs     Screening pelvic and breast exams All women Annually if high risk. Every 2 yrs for normal risk women     Sexually Transmitted Diseases Chlamydia Gonorrhea Syphilis All at risk adults Annually for non pregnant females at increased risk         [place] Men Who How Ofter Need  Date of Last Service Action  Prostate Cancer - DRE & PSA Men over 50 Annually.  DRE might require a copay.     Sexually Transmitted Diseases Syphilis All at risk adults Annually for men at increased risk         Things That May Be Affecting Your Health:  Alcohol  Hearing loss  Pain    Depression  Home  Safety  Sexual Health   Diabetes X Lack of physical activity  Stress   Difficulty with daily activities  Loneliness  Tiredness   Drug use  Medicines  Tobacco use   Falls  Motor Vehicle Safety  Weight   Food choices  Oral Health  Other    YOUR PERSONALIZED HEALTH PLAN : 1. Schedule your next subsequent Medicare Wellness visit in one year 2. Attend all of your regular appointments to address your medical issues 3. Complete the preventative screenings and services 4. We recommend you obtain your Shingles and pneumonia vaccines. If you obtain these outside of your Primary Care Provider's office please let us know so we may update your records accordingly.     Fall Prevention in the Home, Adult Falls can cause injuries and can happen to people of all ages. There are many things you can do to make your home safe and to help prevent  falls. Ask for help when making these changes. What actions can I take to prevent falls? General Instructions Use good lighting in all rooms. Replace any light bulbs that burn out. Turn on the lights in dark areas. Use night-lights. Keep items that you use often in easy-to-reach places. Lower the shelves around your home if needed. Set up your furniture so you have a clear path. Avoid moving your furniture around. Do not have throw rugs or other things on the floor that can make you trip. Avoid walking on wet floors. If any of your floors are uneven, fix them. Add color or contrast paint or tape to clearly mark and help you see: Grab bars or handrails. First and last steps of staircases. Where the edge of each step is. If you use a stepladder: Make sure that it is fully opened. Do not climb a closed stepladder. Make sure the sides of the stepladder are locked in place. Ask someone to hold the stepladder while you use it. Know where your pets are when moving through your home. What can I do in the bathroom?   Keep the floor dry. Clean up any water on the floor right away. Remove soap buildup in the tub or shower. Use nonskid mats or decals on the floor of the tub or shower. Attach bath mats securely with double-sided, nonslip rug tape. If you need to sit down in the shower, use a plastic, nonslip stool. Install grab bars by the toilet and in the tub and shower. Do not use towel bars as grab bars. What can I do in the bedroom? Make sure that you have a light by your bed that is easy to reach. Do not use any sheets or blankets for your bed that hang to the floor. Have a firm chair with side arms that you can use for support when you get dressed. What can I do in the kitchen? Clean up any spills right away. If you need to reach something above you, use a step stool with a grab bar. Keep electrical cords out of the way. Do not use floor polish or wax that makes floors slippery. What  can I do with my stairs? Do not leave any items on the stairs. Make sure that you have a light switch at the top and the bottom of the stairs. Make sure that there are handrails on both sides of the stairs. Fix handrails that are broken or loose. Install nonslip stair treads on all your stairs. Avoid having throw rugs at the top or bottom of the  stairs. Choose a carpet that does not hide the edge of the steps on the stairs. Check carpeting to make sure that it is firmly attached to the stairs. Fix carpet that is loose or worn. What can I do on the outside of my home? Use bright outdoor lighting. Fix the edges of walkways and driveways and fix any cracks. Remove anything that might make you trip as you walk through a door, such as a raised step or threshold. Trim any bushes or trees on paths to your home. Check to see if handrails are loose or broken and that both sides of all steps have handrails. Install guardrails along the edges of any raised decks and porches. Clear paths of anything that can make you trip, such as tools or rocks. Have leaves, snow, or ice cleared regularly. Use sand or salt on paths during winter. Clean up any spills in your garage right away. This includes grease or oil spills. What other actions can I take? Wear shoes that: Have a low heel. Do not wear high heels. Have rubber bottoms. Feel good on your feet and fit well. Are closed at the toe. Do not wear open-toe sandals. Use tools that help you move around if needed. These include: Canes. Walkers. Scooters. Crutches. Review your medicines with your doctor. Some medicines can make you feel dizzy. This can increase your chance of falling. Ask your doctor what else you can do to help prevent falls. Where to find more information Centers for Disease Control and Prevention, [employer]: FootballExhibition.com.br [employer] on Aging: https://walker.com/ Contact a doctor if: You are afraid of falling at home. You feel weak,  drowsy, or dizzy at home. You fall at home. Summary There are many simple things that you can do to make your home safe and to help prevent falls. Ways to make your home safe include removing things that can make you trip and installing grab bars in the bathroom. Ask for help when making these changes in your home. This information is not intended to replace advice given to you by your health care provider. Make sure you discuss any questions you have with your health care provider. Document Revised: 05/12/2020 Document Reviewed: 05/12/2020 Elsevier Patient Education  2022 Elsevier Inc.  Health Maintenance, Male Adopting a healthy lifestyle and getting preventive care are important in promoting health and wellness. Ask your health care provider about: The right schedule for you to have regular tests and exams. Things you can do on your own to prevent diseases and keep yourself healthy. What should I know about diet, weight, and exercise? Eat a healthy diet  Eat a diet that includes plenty of vegetables, fruits, low-fat dairy products, and lean protein. Do not eat a lot of foods that are high in solid fats, added sugars, or sodium. Maintain a healthy weight Body mass index (BMI) is a measurement that can be used to identify possible weight problems. It estimates body fat based on height and weight. Your health care provider can help determine your BMI and help you achieve or maintain a healthy weight. Get regular exercise Get regular exercise. This is one of the most important things you can do for your health. Most adults should: Exercise for at least 150 minutes each week. The exercise should increase your heart rate and make you sweat (moderate-intensity exercise). Do strengthening exercises at least twice a week. This is in addition to the moderate-intensity exercise. Spend less time sitting. Even light physical activity can be beneficial. Watch  cholesterol and blood lipids Have your  blood tested for lipids and cholesterol at 58 years of age, then have this test every 5 years. You may need to have your cholesterol levels checked more often if: Your lipid or cholesterol levels are high. You are older than 58 years of age. You are at high risk for heart disease. What should I know about cancer screening? Many types of cancers can be detected early and may often be prevented. Depending on your health history and family history, you may need to have cancer screening at various ages. This may include screening for: Colorectal cancer. Prostate cancer. Skin cancer. Lung cancer. What should I know about heart disease, diabetes, and high blood pressure? Blood pressure and heart disease High blood pressure causes heart disease and increases the risk of stroke. This is more likely to develop in people who have high blood pressure readings or are overweight. Talk with your health care provider about your target blood pressure readings. Have your blood pressure checked: Every 3-5 years if you are 5918-58 years of age. Every year if you are 58 years old or older. If you are between the ages of 7565 and 2275 and are a current or former smoker, ask your health care provider if you should have a one-time screening for abdominal aortic aneurysm (AAA). Diabetes Have regular diabetes screenings. This checks your fasting blood sugar level. Have the screening done: Once every three years after age 58 if you are at a normal weight and have a low risk for diabetes. More often and at a younger age if you are overweight or have a high risk for diabetes. What should I know about preventing infection? Hepatitis B If you have a higher risk for hepatitis B, you should be screened for this virus. Talk with your health care provider to find out if you are at risk for hepatitis B infection. Hepatitis C Blood testing is recommended for: Everyone born from [id number] through 1965. Anyone with known risk factors  for hepatitis C. Sexually transmitted infections (STIs) You should be screened each year for STIs, including gonorrhea and chlamydia, if: You are sexually active and are younger than 58 years of age. You are older than 58 years of age and your health care provider tells you that you are at risk for this type of infection. Your sexual activity has changed since you were last screened, and you are at increased risk for chlamydia or gonorrhea. Ask your health care provider if you are at risk. Ask your health care provider about whether you are at high risk for HIV. Your health care provider may recommend a prescription medicine to help prevent HIV infection. If you choose to take medicine to prevent HIV, you should first get tested for HIV. You should then be tested every 3 months for as long as you are taking the medicine. Follow these instructions at home: Alcohol use Do not drink alcohol if your health care provider tells you not to drink. If you drink alcohol: Limit how much you have to 0-2 drinks a day. Know how much alcohol is in your drink. In the [place] one drink equals one 12 oz bottle of beer (355 mL), one 5 oz glass of wine (148 mL), or one 1 oz glass of hard liquor (44 mL). Lifestyle Do not use any products that contain nicotine or tobacco. These products include cigarettes, chewing tobacco, and vaping devices, such as e-cigarettes. If you need help quitting, ask your health care  provider. Do not use street drugs. Do not share needles. Ask your health care provider for help if you need support or information about quitting drugs. General instructions Schedule regular health, dental, and eye exams. Stay current with your vaccines. Tell your health care provider if: You often feel depressed. You have ever been abused or do not feel safe at home. Summary Adopting a healthy lifestyle and getting preventive care are important in promoting health and wellness. Follow your health care  provider's instructions about healthy diet, exercising, and getting tested or screened for diseases. Follow your health care provider's instructions on monitoring your cholesterol and blood pressure. This information is not intended to replace advice given to you by your health care provider. Make sure you discuss any questions you have with your health care provider. Document Revised: 02/28/2021 Document Reviewed: 02/28/2021 Elsevier Patient Education  2022 [employer].

## 2021-11-08 NOTE — Progress Notes (Signed)
I discussed the AWV findings with the provider who conducted the visit. I was present in the office suite and immediately available to provide assistance and direction throughout the time the service was provided.  Dolan Amen, MD Internal Medicine Resident PGY-3 [facility] 11/08/2021 3:10 PM

## 2021-11-22 DIAGNOSIS — H40013 Open angle with borderline findings, low risk, bilateral: Secondary | ICD-10-CM | POA: Diagnosis not present

## 2021-11-22 DIAGNOSIS — H5501 Congenital nystagmus: Secondary | ICD-10-CM | POA: Diagnosis not present

## 2021-11-22 DIAGNOSIS — H2513 Age-related nuclear cataract, bilateral: Secondary | ICD-10-CM | POA: Diagnosis not present

## 2021-11-22 DIAGNOSIS — H0102A Squamous blepharitis right eye, upper and lower eyelids: Secondary | ICD-10-CM | POA: Diagnosis not present

## 2021-11-22 DIAGNOSIS — H0102B Squamous blepharitis left eye, upper and lower eyelids: Secondary | ICD-10-CM | POA: Diagnosis not present

## 2021-11-22 DIAGNOSIS — E119 Type 2 diabetes mellitus without complications: Secondary | ICD-10-CM | POA: Diagnosis not present

## 2021-11-22 LAB — HM DIABETES EYE EXAM

## 2021-11-23 ENCOUNTER — Other Ambulatory Visit: Payer: Self-pay | Admitting: Internal Medicine

## 2021-11-23 DIAGNOSIS — K219 Gastro-esophageal reflux disease without esophagitis: Secondary | ICD-10-CM

## 2021-11-29 NOTE — Progress Notes (Signed)
Follow-up Visit   Date: 11/30/21   Larry Wiley MRN: [id number] DOB: 12-02-1965   Interim History: Larry Wiley is a 55 y.o. right-handed Caucasian male with well-controlled diabetes mellitus, hypertension, GERD, and hyperlipidemia returning to the clinic for follow-up of right hand numbness.  The patient was accompanied to the clinic by sister who also provides collateral information.    History of present illness: He is high-functioning autism and lives with sister and brother-in-law since 2006.    Starting around January 2022, he began having intermittent tingling and numbness of the right hand, which involves all the fingers and entire hand.  It is more noticeable when was drying the dishes or mowing the lawn.  He lasts for a few minutes and often finds that shaking the hands wakes it up. He has weakness with opening jars.  He has not dropped anything.  No neck pain or radicular pain. He denies similar symptoms on the left hand.    He has well-controlled diabetes.  He denies numbness/tinging of the feet.  Balance is good.  No falls or weakness. He walks unassisted.  UPDATE 05/30/2021:   Since using his wrist brace, his right hand tingling occurs much less frequent.  He wear it when he is mowing the lawn.  He does not use it at bedtime.  His sister has also observed that he is shaking his right hand less.    UPDATE 11/30/2021: He is here for follow-up visit.  He is doing very well and denies ongoing numbness, tingling, or weakness of the right hand.  His sister also says that she has not noticed him shaking his hand at all.  No interval falls, illness, or hospitalizations. No new complaints.   Medications:  Current Outpatient Medications on File Prior to Visit  Medication Sig Dispense Refill   ACCU-CHEK FASTCLIX LANCETS MISC check blood sugar up to 1 time a day as instructed 102 each 5   ACCU-CHEK GUIDE test strip CHECK BLOOD SUGAR UP TO 1 TIME A DAY AS INSTRUCTED 100 strip 5    atorvastatin (LIPITOR) 40 MG tablet TAKE 1 TABLET BY MOUTH EVERY DAY 90 tablet 3   Blood Glucose Monitoring Suppl (ACCU-CHEK GUIDE) w/Device KIT 1 each by Does not apply route daily. check blood sugar up to 1 time a day as instructed. 1 kit 1   cetirizine (ZYRTEC ALLERGY) 10 MG tablet Take 1 tablet by mouth once daily 30 tablet 5   dapagliflozin propanediol (FARXIGA) 10 MG TABS tablet Take 1 tablet (10 mg total) by mouth daily before breakfast. 90 tablet 1   hydrochlorothiazide (MICROZIDE) 12.5 MG capsule TAKE 1 CAPSULE BY MOUTH EVERY DAY 90 capsule 3   JANUVIA 100 MG tablet TAKE 1 TABLET BY MOUTH EVERY DAY 90 tablet 3   latanoprost (XALATAN) 0.005 % ophthalmic solution Place 1 drop into both eyes at bedtime. 2.5 mL 3   metFORMIN (GLUCOPHAGE) 1000 MG tablet TAKE 1 TABLET (1,000 MG TOTAL) BY MOUTH 2 (TWO) TIMES DAILY WITH A MEAL. 180 tablet 3   metoprolol succinate (TOPROL-XL) 25 MG 24 hr tablet TAKE 1/2 TABLET BY MOUTH EVERY DAY 45 tablet 3   metroNIDAZOLE (METROCREAM) 0.75 % cream APPLY TOPICALLY DAILY. APPLY ONLY TO THE RASH 45 g 1   pantoprazole (PROTONIX) 20 MG tablet TAKE 1 TABLET BY MOUTH EVERY DAY 90 tablet 1   No current facility-administered medications on file prior to visit.    Allergies:  Allergies  Allergen Reactions   Ace  Inhibitors Other (See Comments)     angiodema   Amoxicillin Hives    Has patient had a PCN reaction causing immediate rash, facial/tongue/throat swelling, SOB or lightheadedness with hypotension: Yes Has patient had a PCN reaction causing severe rash involving mucus membranes or skin necrosis: No Has patient had a PCN reaction that required hospitalization No Has patient had a PCN reaction occurring within the last 10 years: Yes If all of the above answers are "NO", then may proceed with Cephalosporin use.   Amoxicillin-Pot Clavulanate Hives    Vital Signs:  BP 122/78    Pulse 73    Ht _0  (1.575 m)    Wt 137 lb (62.1 kg)    SpO2 97%    BMI 25.06 kg/m    Neurological Exam: MENTAL STATUS including orientation to time, place, person, recent and remote memory, attention span and concentration, language, and fund of knowledge is normal.  Speech is not dysarthric.  CRANIAL NERVES:  Pupils equal round and reactive to light.  Normal conjugate, extra-ocular eye movements in all directions of gaze.  Left ptosis (old).   MOTOR:  Motor strength is 5/5 in all extremities.  Mild right ABP atrophy. No fasciculations or abnormal movements.  No pronator drift.  Tone is normal.    MSRs:  Reflexes are 2+/4 throughout,.  SENSORY:  Intact to vibration and temperature.  COORDINATION/GAIT:   Gait narrow based and stable.    Data: NCS/EMG of the arms 03/29/2021: Right median neuropathy at or distal to the wrist (severe), consistent with a clinical diagnosis of carpal tunnel syndrome.  Right ulnar neuropathy with slowing across the elbow, purely demyelinating, moderate.    IMPRESSION/PLAN: Right carpal tunnel syndrome, severe by EMG.  Clinically, he reports significant improvement with using wrist brace and limiting activities which aggravate symptoms.  Today, he denies any numbness, tingling, or weakness of the hand.  - Encouraged to continue to use brace as needed, especially when doing outdoor work involving repetitive hand movements  - If symptoms get worse we can refer to hand orthopeadics  2. Right cubital tunnel syndrome, mild.  Asymptomatic.  Return to clinic as needed  Thank you for allowing me to participate in patient's care.  If I can answer any additional questions, I would be pleased to do so.    Sincerely,    Pascal Stiggers K. Posey Pronto, DO

## 2021-11-30 ENCOUNTER — Other Ambulatory Visit: Payer: Self-pay

## 2021-11-30 ENCOUNTER — Ambulatory Visit (INDEPENDENT_AMBULATORY_CARE_PROVIDER_SITE_OTHER): Payer: Medicare Other | Admitting: Neurology

## 2021-11-30 ENCOUNTER — Encounter: Payer: Self-pay | Admitting: Neurology

## 2021-11-30 VITALS — BP 122/78 | HR 73 | Ht 62.0 in | Wt 137.0 lb

## 2021-11-30 DIAGNOSIS — G5621 Lesion of ulnar nerve, right upper limb: Secondary | ICD-10-CM

## 2021-11-30 DIAGNOSIS — G5601 Carpal tunnel syndrome, right upper limb: Secondary | ICD-10-CM | POA: Diagnosis not present

## 2021-11-30 NOTE — Patient Instructions (Signed)
It was lovely to see you today!  Continue to use your wrist brace as you need  If your symptoms get worse, please come back and see me.

## 2021-12-13 ENCOUNTER — Ambulatory Visit (INDEPENDENT_AMBULATORY_CARE_PROVIDER_SITE_OTHER): Payer: Medicare Other | Admitting: Internal Medicine

## 2021-12-13 ENCOUNTER — Encounter: Payer: Self-pay | Admitting: Internal Medicine

## 2021-12-13 ENCOUNTER — Telehealth: Payer: Self-pay

## 2021-12-13 VITALS — BP 125/74 | HR 83 | Temp 98.0°F | Ht 62.0 in | Wt 138.5 lb

## 2021-12-13 DIAGNOSIS — I10 Essential (primary) hypertension: Secondary | ICD-10-CM

## 2021-12-13 DIAGNOSIS — E7800 Pure hypercholesterolemia, unspecified: Secondary | ICD-10-CM | POA: Diagnosis not present

## 2021-12-13 DIAGNOSIS — R809 Proteinuria, unspecified: Secondary | ICD-10-CM

## 2021-12-13 DIAGNOSIS — E1129 Type 2 diabetes mellitus with other diabetic kidney complication: Secondary | ICD-10-CM

## 2021-12-13 DIAGNOSIS — K439 Ventral hernia without obstruction or gangrene: Secondary | ICD-10-CM | POA: Diagnosis not present

## 2021-12-13 DIAGNOSIS — K4020 Bilateral inguinal hernia, without obstruction or gangrene, not specified as recurrent: Secondary | ICD-10-CM

## 2021-12-13 DIAGNOSIS — Z0000 Encounter for general adult medical examination without abnormal findings: Secondary | ICD-10-CM

## 2021-12-13 HISTORY — DX: Bilateral inguinal hernia, without obstruction or gangrene, not specified as recurrent: K40.20

## 2021-12-13 LAB — POCT GLYCOSYLATED HEMOGLOBIN (HGB A1C): Hemoglobin A1C: 8 % — AB (ref 4.0–5.6)

## 2021-12-13 LAB — GLUCOSE, CAPILLARY: Glucose-Capillary: 190 mg/dL — ABNORMAL HIGH (ref 70–99)

## 2021-12-13 MED ORDER — ACCU-CHEK GUIDE VI STRP: ORAL_STRIP | 5 refills | Status: DC

## 2021-12-13 NOTE — Assessment & Plan Note (Signed)
-   Patient structured to get shingles vaccine at his pharmacy -We will attempt to get Cologuard for patient to check an FOBT

## 2021-12-13 NOTE — Assessment & Plan Note (Addendum)
Lab Results  Component Value Date   HGBA1C 8.0 (A) 12/13/2021   HGBA1C 6.9 (A) 08/02/2021   HGBA1C 6.6 (A) 12/21/2020     Assessment: Diabetes control:  Fair Progress toward A1C goal:   Deteriorated Comments: We will continue with metformin 1000 mg twice daily, Januvia 100 mg daily as well as Farxiga 10 mg daily.  We did stop glipizide at his last visit secondary to hypoglycemic episodes.  Plan: Medications:  continue current medications Home glucose monitoring: Frequency:   Timing:   Instruction/counseling given: discussed the need for weight loss and discussed diet Educational resources provided:   Self management tools provided:   Other plans: If patient's A1c remains elevated at next visit we will consider adding an additional medication to his regimen.  Patient did obtain an eye exam last month with Dr. Dione Booze.  Will obtain results from him

## 2021-12-13 NOTE — Addendum Note (Signed)
Addended by: Earl Lagos on: 12/13/2021 10:37 AM   Modules accepted: Orders

## 2021-12-13 NOTE — Progress Notes (Signed)
° °  Subjective:    Patient ID: Larry Wiley, male    DOB: 1966/06/05, 56 y.o.   MRN: [id number]  Diabetes  Medication Refill   I have seen and examined the patient.  Patient is here for routine follow-up of his hypertension and diabetes.  Patient's sister noted patient does have some bulging in the groin area and was concerned for a possible hernia.  Patient denies any complaints at this time and states that he is compliant with all his medications.   Review of Systems  Constitutional: Negative.   HENT: Negative.    Respiratory: Negative.    Cardiovascular: Negative.   Gastrointestinal:        Patient sister noted swelling in the groin area concerning for possible hernia  Musculoskeletal: Negative.   Neurological: Negative.   Psychiatric/Behavioral: Negative.        Objective:   Physical Exam Constitutional:      Appearance: Normal appearance.  HENT:     Head: Normocephalic and atraumatic.  Cardiovascular:     Rate and Rhythm: Normal rate and regular rhythm.     Heart sounds: Normal heart sounds.  Pulmonary:     Effort: Pulmonary effort is normal.     Breath sounds: Normal breath sounds. No wheezing or rales.  Abdominal:     General: Bowel sounds are normal.     Palpations: Abdomen is soft.     Tenderness: There is no abdominal tenderness.     Comments: Patient noted to have bulging in his suprapubic area bilaterally.  It was not reducible nor tender to palpation and there is no change with increased abdominal pressure with coughing  Musculoskeletal:        General: No swelling or tenderness.     Cervical back: Neck supple.  Lymphadenopathy:     Cervical: No cervical adenopathy.  Neurological:     General: No focal deficit present.     Mental Status: He is alert and oriented to person, place, and time.  Psychiatric:        Mood and Affect: Mood normal.        Behavior: Behavior normal.          Assessment & Plan:  Please see problem based charting for  assessment and plan:

## 2021-12-13 NOTE — Assessment & Plan Note (Signed)
BP Readings from Last 3 Encounters:  12/13/21 125/74  11/30/21 122/78  10/31/21 111/79    Lab Results  Component Value Date   NA 137 08/02/2021   K 4.1 08/02/2021   CREATININE 0.89 08/02/2021    Assessment: Blood pressure control:  Well-controlled Progress toward BP goal:   At goal Comments: Patient is compliant with metoprolol succinate 12.5 mg daily as well as hydrochlorothiazide 12.5 mg daily  Plan: Medications:  continue current medications Educational resources provided:   Self management tools provided:   Other plans: We will check BMP next visit

## 2021-12-13 NOTE — Assessment & Plan Note (Signed)
-   This problem is chronic and stable -Patient is compliant with Lipitor 40 mg daily -No further work-up at this time

## 2021-12-13 NOTE — Assessment & Plan Note (Signed)
-   Patient sister noted patient had some bulging in his groin and was concerned for possible hernia -Patient denies any pain.  Abdominal bulge remains constant with no variation on lying down or sitting up -On exam, patient was noted to have bilateral suprapubic swelling which was soft, nontender and nonreducible. -This finding does not appear to be consistent with hernia but more likely secondary to fat deposition -We will obtain a CT abdomen/pelvis for further evaluation to rule out hernia -No further work-up at this time

## 2021-12-13 NOTE — Patient Instructions (Signed)
-  It was a pleasure seeing you today -Your A1c is mildly elevated up to 8 today.  Please continue with your current medications and resume your diet and exercise.  We will follow-up with [place] in 3 months and if it remains elevated we will consider adding an additional medication to your regimen -I have put in an order for a CT of your abdomen to evaluate you for a hernia.  I will contact you with the results of this -Please obtain your shingles vaccine from the pharmacy -I will put in a Cologuard test for you.  We will follow-up these results -Please call me for any questions or concerns or if you need any refills

## 2021-12-14 MED ORDER — ACCU-CHEK GUIDE VI STRP: ORAL_STRIP | 5 refills | Status: DC

## 2021-12-14 NOTE — Telephone Encounter (Addendum)
Done

## 2021-12-14 NOTE — Addendum Note (Signed)
Addended by: Earl Lagos on: 12/14/2021 09:57 AM   Modules accepted: Orders

## 2021-12-19 NOTE — Addendum Note (Signed)
Addended by: Earl Lagos on: 12/19/2021 11:29 AM   Modules accepted: Orders

## 2021-12-20 ENCOUNTER — Encounter: Payer: Self-pay | Admitting: Dietician

## 2021-12-22 ENCOUNTER — Other Ambulatory Visit: Payer: Self-pay

## 2021-12-22 ENCOUNTER — Ambulatory Visit (HOSPITAL_BASED_OUTPATIENT_CLINIC_OR_DEPARTMENT_OTHER)
Admission: RE | Admit: 2021-12-22 | Discharge: 2021-12-22 | Disposition: A | Discharge status: Home / Self Care | Payer: Medicare Other | Source: Ambulatory Visit | Attending: Internal Medicine | Admitting: Internal Medicine

## 2021-12-22 DIAGNOSIS — K5900 Constipation, unspecified: Secondary | ICD-10-CM | POA: Diagnosis not present

## 2021-12-22 DIAGNOSIS — K3189 Other diseases of stomach and duodenum: Secondary | ICD-10-CM | POA: Diagnosis not present

## 2021-12-22 DIAGNOSIS — K439 Ventral hernia without obstruction or gangrene: Secondary | ICD-10-CM | POA: Diagnosis not present

## 2021-12-22 DIAGNOSIS — K4020 Bilateral inguinal hernia, without obstruction or gangrene, not specified as recurrent: Secondary | ICD-10-CM | POA: Diagnosis not present

## 2021-12-22 DIAGNOSIS — N281 Cyst of kidney, acquired: Secondary | ICD-10-CM | POA: Diagnosis not present

## 2021-12-23 ENCOUNTER — Telehealth: Payer: Self-pay | Admitting: Internal Medicine

## 2021-12-23 DIAGNOSIS — K4020 Bilateral inguinal hernia, without obstruction or gangrene, not specified as recurrent: Secondary | ICD-10-CM

## 2021-12-23 NOTE — Telephone Encounter (Signed)
Patient CT results showed large bilateral inguinal hernias containing fat and nonobstructed bowel.  Results discussed with sister via phone.  We will refer patient to general surgery for evaluation for possible repair.  No further work-up at this time.  Sister expressed understanding and is in agreement with plan. ?

## 2021-12-23 NOTE — Assessment & Plan Note (Signed)
-   CT abdomen/pelvis shows bilateral inguinal hernias containing fat and nonobstructed bowel ?-Results discussed with patient and sister via phone. ?-Will refer patient to general surgery for evaluation ?-No further work-up at this time ?

## 2022-01-02 ENCOUNTER — Telehealth: Payer: Self-pay

## 2022-01-02 DIAGNOSIS — R809 Proteinuria, unspecified: Secondary | ICD-10-CM

## 2022-01-02 DIAGNOSIS — E1129 Type 2 diabetes mellitus with other diabetic kidney complication: Secondary | ICD-10-CM

## 2022-01-02 MED ORDER — ACCU-CHEK GUIDE VI STRP: ORAL_STRIP | 5 refills | Status: AC

## 2022-01-26 DIAGNOSIS — K4021 Bilateral inguinal hernia, without obstruction or gangrene, recurrent: Secondary | ICD-10-CM | POA: Diagnosis not present

## 2022-02-07 ENCOUNTER — Ambulatory Visit (INDEPENDENT_AMBULATORY_CARE_PROVIDER_SITE_OTHER): Payer: Medicare Other | Admitting: Internal Medicine

## 2022-02-07 ENCOUNTER — Encounter: Payer: Self-pay | Admitting: Internal Medicine

## 2022-02-07 DIAGNOSIS — R809 Proteinuria, unspecified: Secondary | ICD-10-CM | POA: Diagnosis not present

## 2022-02-07 DIAGNOSIS — G5601 Carpal tunnel syndrome, right upper limb: Secondary | ICD-10-CM

## 2022-02-07 DIAGNOSIS — E1129 Type 2 diabetes mellitus with other diabetic kidney complication: Secondary | ICD-10-CM | POA: Diagnosis not present

## 2022-02-07 DIAGNOSIS — I10 Essential (primary) hypertension: Secondary | ICD-10-CM

## 2022-02-07 DIAGNOSIS — K4020 Bilateral inguinal hernia, without obstruction or gangrene, not specified as recurrent: Secondary | ICD-10-CM | POA: Diagnosis not present

## 2022-02-07 NOTE — Assessment & Plan Note (Addendum)
Patient is having no pain due to carpal tunnel syndrome. He wears a brace on his R wrist when completing yard work, but pain and symptoms are not limiting. Will continue to monitor. ? ?Plan ?- Continue wearing brace as needed ?

## 2022-02-07 NOTE — Patient Instructions (Addendum)
It was very nice to see you in clinic today! Here are a few reminders from what we discussed: ? ?Your blood sugars have been higher. Please take 2 Farxiga 5mg  pills instead of 1 for a total of 10mg . If your other bottle is a 10mg  dose, please only take 1 pill. ?Continue monitoring your blood sugars daily. ?Please try to reduce your snacking! ?Now that the weather is becoming warmer, we encourage you to take walks and enjoy yard work. ?Please follow up with Dr. to see if he would like to schedule you for surgery for the hernias. ? ?Please follow up in 2 months! We will recheck your A1c at that time. If you have any questions or concerns before then, please do not hesitate to call ! ?

## 2022-02-07 NOTE — Assessment & Plan Note (Signed)
Patient has bilateral inguinal hernias in stable condition. On exam, no tenderness to palpation, erythema, or discoloration. No change in hernia with valsalva maneuver, and hernia was non-reducible. Evaluated by Dr. Derrell Lolling of general surgery on 01/26/2022. His note says patient and his sister should likely think about surgery in the near future because the hernias will get larger. Explained this to patient and sister because there seemed to be some confusion about when patient should return. They thought they were told to return only if patient develops pain, and informed them that they should probably follow-up with the surgeon to see if he would like to proceed with planning an operation. ? ?Plan ?- Patient's sister will call the office of Dr. Derrell Lolling to find out next steps and contact us with an update ?

## 2022-02-07 NOTE — Progress Notes (Signed)
? ?This is a Careers information officer Note.  The care of the patient was discussed with Dr. Aldine Contes, MD and the assessment and plan was formulated with their assistance.  Please see their note for official documentation of the patient encounter.  ? ?Subjective:  ? ?Patient ID: Larry Wiley male   DOB: Jun 14, 1966 56 y.o.   MRN: [id number] ? ?HPI: ?Larry Wiley is a 56 y.o. with PMHx of HTN, GERD, hyperlipidemia, TIIDM who presents with concern of elevated blood sugars. Please see problem-based charting for complete assessment and plan. ? ? ?Past Medical History:  ?Diagnosis Date  ? Acne rosacea, papular type 02/25/2016  ? Bilateral inguinal hernia   ? Essential hypertension 08/18/2006  ? Gastroesophageal reflux disease 09/27/2012  ? Intermittent symptoms, does not want therapy   ? Hyperlipidemia LDL goal < 100 05/03/2007  ? Mental retardation 08/18/2006  ? Open-angle glaucoma 07/25/2013  ? Overweight (BMI 25.0-29.9) 09/27/2012  ? Type 2 diabetes mellitus with proteinuria or microalbuminuria 08/18/2006  ? ?Current Outpatient Medications  ?Medication Sig Dispense Refill  ? ACCU-CHEK FASTCLIX LANCETS MISC check blood sugar up to 1 time a day as instructed 102 each 5  ? atorvastatin (LIPITOR) 40 MG tablet TAKE 1 TABLET BY MOUTH EVERY DAY 90 tablet 3  ? Blood Glucose Monitoring Suppl (ACCU-CHEK GUIDE) w/Device KIT 1 each by Does not apply route daily. check blood sugar up to 1 time a day as instructed. 1 kit 1  ? cetirizine (ZYRTEC ALLERGY) 10 MG tablet Take 1 tablet by mouth once daily 30 tablet 5  ? dapagliflozin propanediol (FARXIGA) 10 MG TABS tablet Take 1 tablet (10 mg total) by mouth daily before breakfast. 90 tablet 1  ? glucose blood (ACCU-CHEK GUIDE) test strip Check blood sugars daily 100 strip 5  ? hydrochlorothiazide (MICROZIDE) 12.5 MG capsule TAKE 1 CAPSULE BY MOUTH EVERY DAY 90 capsule 3  ? JANUVIA 100 MG tablet TAKE 1 TABLET BY MOUTH EVERY DAY 90 tablet 3  ? latanoprost (XALATAN) 0.005 % ophthalmic solution  Place 1 drop into both eyes at bedtime. 2.5 mL 3  ? metFORMIN (GLUCOPHAGE) 1000 MG tablet TAKE 1 TABLET (1,000 MG TOTAL) BY MOUTH 2 (TWO) TIMES DAILY WITH A MEAL. 180 tablet 3  ? metoprolol succinate (TOPROL-XL) 25 MG 24 hr tablet TAKE 1/2 TABLET BY MOUTH EVERY DAY 45 tablet 3  ? metroNIDAZOLE (METROCREAM) 0.75 % cream APPLY TOPICALLY DAILY. APPLY ONLY TO THE RASH 45 g 1  ? pantoprazole (PROTONIX) 20 MG tablet TAKE 1 TABLET BY MOUTH EVERY DAY 90 tablet 1  ? ?No current facility-administered medications for this visit.  ? ?Family History  ?Problem Relation Age of Onset  ? Hyperlipidemia Mother   ? Hypertension Mother   ? Liver cancer Mother   ? Diabetes Father   ? Heart attack Father   ? Cancer Sister   ?     liver  ? Cancer Brother   ?     lung  ? ?Social History  ? ?Socioeconomic History  ? Marital status: Single  ?  Spouse name: Not on file  ? Number of children: 0  ? Years of education: Not on file  ? Highest education level: Not on file  ?Occupational History  ? Not on file  ?Tobacco Use  ? Smoking status: Never  ? Smokeless tobacco: Never  ?Vaping Use  ? Vaping Use: Never used  ?Substance and Sexual Activity  ? Alcohol use: No  ?  Alcohol/week: 0.0 standard drinks  ?  Drug use: No  ? Sexual activity: Never  ?Other Topics Concern  ? Not on file  ?Social History Narrative  ? Current Social History 10/31/2021    ?   ? Patient lives with his sister in a home which is 1 story. There are 4 steps up to the entrance the patient uses with a railing  ?   ? Patient's method of transportation is via family member.  ?   ? The highest level of education was some high school.  ?   ? 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  ?   ? Identified important Relationships are "his family"  ?   ? Pets : 0  ?    ? Interests / Fun: "Sports, watch TV, car shows"   ?   ? Current Stressors: "none"   ?   ? Religious / Personal Beliefs: "none"   ?   ? ?Social Determinants of Health  ? ?Financial Resource Strain: Not on file  ?Food  Insecurity: Not on file  ?Transportation Needs: Not on file  ?Physical Activity: Not on file  ?Stress: Not on file  ?Social Connections: Not on file  ? ?Review of Systems: ?Pertinent items are noted in HPI. ?Objective:  ?Physical Exam: ?Vitals:  ? 02/07/22 0935  ?BP: 125/72  ?Pulse: 86  ?Temp: 97.8 ?F (36.6 ?C)  ?TempSrc: Oral  ?SpO2: 100%  ?Weight: 137 lb 1.6 oz (62.2 kg)  ? ?BP 125/72 (BP Location: Right Arm, Patient Position: Sitting, Cuff Size: Small)   Pulse 86   Temp 97.8 ?F (36.6 ?C) (Oral)   Wt 137 lb 1.6 oz (62.2 kg)   SpO2 100%   BMI 25.08 kg/m?  ? ?General Appearance:    Alert, cooperative, no distress  ?Lungs:     Clear to auscultation bilaterally, respirations unlabored  ?Heart:    Regular rate and rhythm, S1 and S2 normal, no murmur, rub   or gallop  ?Abdomen:     Soft, non-tender, bowel sounds active all four quadrants,   ?Genitalia:    Bilateral, prominent inguinal hernias above male genitalia, non-tender to palpation, no erythema or discoloration, unchanged with valsalva maneuver, non-reducible  ? ?Assessment & Plan:  ? ?Please see encounters tab for problem-based charting. ? ?Type 2 diabetes mellitus with microalbuminuria, without long-term current use of insulin ([place]) ?Patient has Type II DM that has been well-controlled, but recent sugar readings that show evidence of less than optimal control. Over the past 3 months, 4/6 recorded fasting blood sugars have been above 200 according to Accu-Chek report. He has no blood sugar readings above 300 over this span and sugar has not gone below 120. When asked about recent changes, patient's sister reports he has been eating a lot and has eaten really fast. He is consistently eating 3 meals each day but eats many snacks including trail mix, peanut butter crackers, etc. She mentioned that when he is active, he eats fewer snacks, and now that the weather is warming up, patient will be able to go for more outdoor walks more and complete yard work.  Patient has continued taking his medications as prescribed, and sister organizes them in a pill box.  ? ?Patient has been taking Metformin 1054m BID with meals, Januvia 10100m and Farxiga 59m39mHe was prescribed 15m59m prior visit, but patient brought 59mg 47mtle to appointment reporting taking one daily. Plan to continue with the same medicaitons, but patient should take two 59mg F48miga tablets until he finishes the  current bottle. Advised patient to increase exercise and decrease snacking. ? ?Plan ?- Take Farxiga 53m daily before breakfast as prescribed  ?- Continue Metformin 10063mBID and Januvia 1004maily ?- Continue daily blood sugar monitoring ?- Increase exercise ?- Decrease snacking ? ?Bilateral inguinal hernia ?Patient has bilateral inguinal hernias in stable condition. On exam, no tenderness to palpation, erythema, or discoloration. No change in hernia with valsalva maneuver, and hernia was non-reducible. Evaluated by Dr. RamRosendo Gros general surgery on 01/26/2022. His note says patient and his sister should likely think about surgery in the near future because the hernias will get larger. Explained this to patient and sister because there seemed to be some confusion about when patient should return. They thought they were told to return only if patient develops pain, and informed them that they should probably follow-up with the surgeon to see if he would like to proceed with planning an operation. ? ?Plan ?- Patient's sister will call the office of Dr. RamRosendo Gros find out next steps and contact us [place] an update ? ?Carpal tunnel syndrome ?Patient is having no pain due to carpal tunnel syndrome. He wears a brace on his R wrist when completing yard work, but pain and symptoms are not limiting. ? ?Essential hypertension ?Blood pressure well-controlled today 125/72. No changes at this time. ? ?Plan ?- Continue Metoprolol 34m69mily ?- Continue HCTZ 12.5mg 32mly ? ?

## 2022-02-07 NOTE — Assessment & Plan Note (Signed)
Blood pressure well-controlled today 125/72. No changes at this time. ? ?Plan ?- Continue Metoprolol 25mg  daily ?- Continue HCTZ 12.5mg  daily ?

## 2022-02-07 NOTE — Assessment & Plan Note (Addendum)
Patient has Type II DM that has been well-controlled, but recent sugar readings that show evidence of less than optimal control. Over the past 3 months, 4/6 recorded fasting blood sugars have been above 200 according to Accu-Chek report. He has no blood sugar readings above 300 over this span and sugar has not gone below 120. When asked about recent changes, patient's sister reports he has been eating a lot and has eaten really fast. He is consistently eating 3 meals each day but eats many snacks including trail mix, peanut butter crackers, etc. She mentioned that when he is active, he eats fewer snacks, and now that the weather is warming up, patient will be able to go for more outdoor walks more and complete yard work. Patient has continued taking his medications as prescribed, and sister organizes them in a pill box.  ? ?Patient has been taking Metformin 1000mg  BID with meals, Januvia 100mg , and Farxiga 5mg . He was prescribed 10mg  at prior visit, but patient brought 5mg  bottle to appointment reporting taking one daily. Plan to continue with the same medicaitons, but patient should take two 5mg  Farxiga tablets until he finishes the current bottle. Advised patient to increase exercise and decrease snacking. Recommended patient follow-up in clinic in 2 months for repeat A1c and continued management. ? ?Plan ?- Take Farxiga 10mg  daily before breakfast as prescribed  ?- Continue Metformin 1000mg  BID and Januvia 100mg  daily ?- Continue daily blood sugar monitoring ?- Increase exercise ?- Decrease snacking ?- Repeat A1c in 2 months (03/2022) ?

## 2022-02-08 NOTE — Progress Notes (Signed)
Attestation for Student Documentation: ? ?I personally was present and performed or re-performed the history, physical exam and medical decision-making activities of this service and have verified that the service and findings are accurately documented in the student?s note. ? ?Earl Lagos, MD ?02/08/2022, 10:53 AM ? ?

## 2022-02-23 ENCOUNTER — Other Ambulatory Visit: Payer: Self-pay | Admitting: Internal Medicine

## 2022-02-23 DIAGNOSIS — E1129 Type 2 diabetes mellitus with other diabetic kidney complication: Secondary | ICD-10-CM

## 2022-02-23 DIAGNOSIS — I10 Essential (primary) hypertension: Secondary | ICD-10-CM

## 2022-04-11 ENCOUNTER — Ambulatory Visit (INDEPENDENT_AMBULATORY_CARE_PROVIDER_SITE_OTHER): Payer: Medicare Other | Admitting: Internal Medicine

## 2022-04-11 VITALS — BP 125/78 | HR 90 | Temp 97.4°F | Ht 62.0 in | Wt 135.4 lb

## 2022-04-11 DIAGNOSIS — R809 Proteinuria, unspecified: Secondary | ICD-10-CM

## 2022-04-11 DIAGNOSIS — E7800 Pure hypercholesterolemia, unspecified: Secondary | ICD-10-CM

## 2022-04-11 DIAGNOSIS — L718 Other rosacea: Secondary | ICD-10-CM

## 2022-04-11 DIAGNOSIS — I10 Essential (primary) hypertension: Secondary | ICD-10-CM

## 2022-04-11 DIAGNOSIS — E1129 Type 2 diabetes mellitus with other diabetic kidney complication: Secondary | ICD-10-CM

## 2022-04-11 DIAGNOSIS — Z7984 Long term (current) use of oral hypoglycemic drugs: Secondary | ICD-10-CM | POA: Diagnosis not present

## 2022-04-11 DIAGNOSIS — K4020 Bilateral inguinal hernia, without obstruction or gangrene, not specified as recurrent: Secondary | ICD-10-CM

## 2022-04-11 DIAGNOSIS — Z0000 Encounter for general adult medical examination without abnormal findings: Secondary | ICD-10-CM

## 2022-04-11 LAB — POCT GLYCOSYLATED HEMOGLOBIN (HGB A1C): Hemoglobin A1C: 7.7 % — AB (ref 4.0–5.6)

## 2022-04-11 LAB — GLUCOSE, CAPILLARY: Glucose-Capillary: 201 mg/dL — ABNORMAL HIGH (ref 70–99)

## 2022-04-11 NOTE — Assessment & Plan Note (Signed)
-   Patient has obtained his Shingrix vaccine from [facility].  We will obtain records for this -Patient also had his Cologuard done last year and his insurance would only approve it every 3 years.  Does not need a fecal occult blood test at this time

## 2022-04-11 NOTE — Assessment & Plan Note (Signed)
-   This problem is chronic and stable -Patient follows up with Dr. Derrell Lolling from general surgery for this and has an appointment scheduled for tomorrow -Patient and his sister agreed that surgery is probably the best option for him and are considering doing this in August -We will follow-up with him after his surgery to see how he is doing -No further work-up at this time

## 2022-04-11 NOTE — Assessment & Plan Note (Signed)
BP Readings from Last 3 Encounters:  04/11/22 125/78  02/07/22 125/72  12/13/21 125/74    Lab Results  Component Value Date   NA 137 08/02/2021   K 4.1 08/02/2021   CREATININE 0.89 08/02/2021    Assessment: Blood pressure control:  Well-controlled Progress toward BP goal:   At goal Comments: Patient is compliant with metoprolol 12.5 mg daily as well as HCTZ 12.5 mg daily  Plan: Medications:  continue current medications Educational resources provided:   Self management tools provided:   Other plans: We will check BMP at next visit.  We will consider starting the patient on an ARB given his history of diabetes.  We will check urine microalbumin creatinine ratio on him next time and if he does have proteinuria we will consider transitioning him off metoprolol and onto losartan.

## 2022-04-11 NOTE — Assessment & Plan Note (Signed)
-   This problem is chronic and stable -Patient uses metronidazole gel as needed for this and it relieves his symptoms -No further work-up at this time

## 2022-04-11 NOTE — Patient Instructions (Addendum)
-  It was a pleasure seeing you today -Your blood pressure is well controlled.  Keep up the great work! -Your A1c has improved slightly but still a little high.  I will refer you to our diabetes educator for diabetes and nutrition education -If your A1c remains high we will consider stopping your Januvia and starting you on a once weekly injection to help improve your A1c -Please follow-up with the surgeon for inguinal hernia repair -Please call me if you have any questions or concerns or if you need any refills -Enjoy the rest of your summer!

## 2022-04-11 NOTE — Assessment & Plan Note (Signed)
Lab Results  Component Value Date   HGBA1C 7.7 (A) 04/11/2022   HGBA1C 8.0 (A) 12/13/2021   HGBA1C 6.9 (A) 08/02/2021     Assessment: Diabetes control:  Fair Progress toward A1C goal:   Improved Comments: Patient is compliant with metformin 1000 mg twice daily, Januvia 100 mg daily as well as Farxiga 10 mg daily.  Plan: Medications:  continue current medications Home glucose monitoring: Frequency:   Timing:   Instruction/counseling given: discussed the need for weight loss and discussed diet Educational resources provided:   Self management tools provided:   Other plans: We will put in a referral to our diabetes educator for diabetes and nutrition counseling.  We will obtain a urine microalbumin creatinine ratio at his next appointment and if he does have proteinuria we will consider transitioning him from metoprolol to an ARB.

## 2022-04-11 NOTE — Progress Notes (Signed)
   Subjective:    Patient ID: Larry Wiley, male    DOB: [birth year], 56 y.o.   MRN: [id number]  HPI  I have seen and examined the patient.  Patient is here for routine follow-up of his hypertension and diabetes.  Patient denies any new complaints today and states that he is compliant with all his medications.  He does have a history of bilateral inguinal hernias and is scheduled to follow-up with the surgeon tomorrow.  Review of Systems  Constitutional: Negative.   HENT: Negative.    Respiratory: Negative.    Cardiovascular: Negative.   Gastrointestinal: Negative.   Musculoskeletal: Negative.   Neurological: Negative.   Psychiatric/Behavioral: Negative.         Objective:   Physical Exam Constitutional:      Appearance: Normal appearance.  HENT:     Head: Atraumatic.     Mouth/Throat:     Mouth: Mucous membranes are moist.     Pharynx: Oropharynx is clear. No oropharyngeal exudate.  Cardiovascular:     Rate and Rhythm: Normal rate and regular rhythm.     Heart sounds: Normal heart sounds.  Pulmonary:     Effort: Pulmonary effort is normal.     Breath sounds: Normal breath sounds. No wheezing or rales.  Abdominal:     General: Bowel sounds are normal. There is no distension.     Palpations: Abdomen is soft.     Tenderness: There is no abdominal tenderness.  Musculoskeletal:        General: No swelling or tenderness.     Cervical back: Neck supple.  Lymphadenopathy:     Cervical: No cervical adenopathy.  Neurological:     Mental Status: He is alert and oriented to person, place, and time.  Psychiatric:        Mood and Affect: Mood normal.        Behavior: Behavior normal.           Assessment & Plan:  Please see problem based charting for assessment and plan:

## 2022-04-11 NOTE — Assessment & Plan Note (Signed)
-   This problem is chronic and stable -Patient is on Lipitor 40 mg daily -We will recheck lipid panel at his next visit -No further work-up for now

## 2022-04-12 ENCOUNTER — Ambulatory Visit: Payer: Self-pay | Admitting: General Surgery

## 2022-04-12 DIAGNOSIS — K4020 Bilateral inguinal hernia, without obstruction or gangrene, not specified as recurrent: Secondary | ICD-10-CM | POA: Diagnosis not present

## 2022-04-12 NOTE — H&P (Signed)
Chief Complaint: Hernia       History of Present Illness: Larry Wiley is a 56 y.o. male who is seen today for for follow-up for bilateral hernias. Patient has 2 large bilateral hernias.  I spoke with him and his sister about these previously and repair.  Patient states he is ready to proceed with surgery at this point.   Patient be a candidate for staged hernia repair.  These are both fairly large and would require open inguinal hernia repair with mesh.   He had no signs or symptoms of incarceration or strangulation.  He states he is ready to have these repaired..     Review of Systems: A complete review of systems was obtained from the patient.  I have reviewed this information and discussed as appropriate with the patient.  See HPI as well for other ROS.   Review of Systems  Constitutional:  Negative for fever.  HENT:  Negative for congestion.   Eyes:  Negative for blurred vision.  Respiratory:  Negative for cough, shortness of breath and wheezing.   Cardiovascular:  Negative for chest pain and palpitations.  Gastrointestinal:  Negative for heartburn.  Genitourinary:  Negative for dysuria.  Musculoskeletal:  Negative for myalgias.  Skin:  Negative for rash.  Neurological:  Negative for dizziness and headaches.  Psychiatric/Behavioral:  Negative for depression and suicidal ideas.   All other systems reviewed and are negative.      Medical History: Past Medical History Past Medical History: Diagnosis Date  Diabetes mellitus without complication (CMS-HCC)    GERD (gastroesophageal reflux disease)        There is no problem list on file for this patient.     Past Surgical History Past Surgical History: Procedure Laterality Date  laser eye surgery          Allergies Allergies Allergen Reactions  Amoxicillin-Pot Clavulanate Hives      Current Outpatient Medications on File Prior to Visit Medication Sig Dispense Refill  atorvastatin (LIPITOR) 40 MG tablet Take  1 tablet by mouth once daily      cetirizine (ZYRTEC) 10 mg capsule Take 10 mg by mouth once daily      FARXIGA 5 mg Tab tablet TAKE 1 TABLET BY MOUTH DAILY BEFORE BREAKFAST.      hydroCHLOROthiazide (MICROZIDE) 12.5 mg capsule Take 12.5 mg by mouth once daily      latanoprost (XALATAN) 0.005 % ophthalmic solution INSTILL 1 DROP INTO BOTH EYES DAILY      metFORMIN (GLUCOPHAGE) 1000 MG tablet TAKE 1 TABLET (1,000 MG TOTAL) BY MOUTH 2 (TWO) TIMES DAILY WITH A MEAL.      metoprolol succinate (TOPROL-XL) 25 MG XL tablet Take 12.5 mg by mouth once daily      metroNIDAZOLE (METROGEL) 0.75 % (37.5mg /5 gram) vaginal gel Place 1 applicator vaginally 2 (two) times daily      pantoprazole (PROTONIX) 20 MG DR tablet Take 1 tablet by mouth once daily      SITagliptin phosphate (JANUVIA) 100 MG tablet Take 1 tablet by mouth once daily       No current facility-administered medications on file prior to visit.     Family History Family History Problem Relation Age of Onset  Stroke Mother    Obesity Father    High blood pressure (Hypertension) Father    Hyperlipidemia (Elevated cholesterol) Father    Diabetes Father    Myocardial Infarction (Heart attack) Father    Diabetes Sister    Hyperlipidemia (Elevated cholesterol) Sister  High blood pressure (Hypertension) Sister        Social History   Tobacco Use Smoking Status Never Smokeless Tobacco Never     Social History Social History    Socioeconomic History  Marital status: Single Tobacco Use  Smoking status: Never  Smokeless tobacco: Never      Objective:     There were no vitals filed for this visit.  There is no height or weight on file to calculate BMI.   Physical Exam Constitutional:      Appearance: Normal appearance.  HENT:     Head: Normocephalic and atraumatic.     Nose: Nose normal. No congestion.     Mouth/Throat:     Mouth: Mucous membranes are moist.     Pharynx: Oropharynx is clear.  Eyes:     Pupils:  Pupils are equal, round, and reactive to light.  Cardiovascular:     Rate and Rhythm: Normal rate and regular rhythm.     Pulses: Normal pulses.     Heart sounds: Normal heart sounds. No murmur heard.   No friction rub. No gallop.  Pulmonary:     Effort: Pulmonary effort is normal. No respiratory distress.     Breath sounds: Normal breath sounds. No stridor. No wheezing, rhonchi or rales.  Abdominal:     General: Abdomen is flat.     Hernia: A hernia is present. Hernia is present in the left inguinal area and right inguinal area.  Musculoskeletal:        General: Normal range of motion.     Cervical back: Normal range of motion.  Skin:    General: Skin is warm and dry.  Neurological:     General: No focal deficit present.     Mental Status: He is alert and oriented to person, place, and time.  Psychiatric:        Mood and Affect: Mood normal.        Thought Content: Thought content normal.      Assessment and Plan:    Diagnoses and all orders for this visit:   Non-recurrent bilateral inguinal hernia without obstruction or gangrene       Patient with bilateral inguinal hernias.  These are both very large.  At this time would recommend stage open hernia repair with mesh. 1.  We will proceed to the operating for an open right inguinal hernia repair with mesh. 2.  I discussed with him the risk benefits of the procedure to include but not limited to: Infection, bleeding, damage structures, possible recurrence.  Patient and sister both voiced understanding wished to proceed. 3.  Once he has healed from the right inguinal hernia we can discuss scheduling the left inguinal hernia, in the open fashion as well.   No follow-ups on file. Axel Filler, MD

## 2022-05-18 ENCOUNTER — Other Ambulatory Visit: Payer: Self-pay | Admitting: Internal Medicine

## 2022-05-18 DIAGNOSIS — R809 Proteinuria, unspecified: Secondary | ICD-10-CM

## 2022-05-19 MED ORDER — DAPAGLIFLOZIN PROPANEDIOL 10 MG PO TABS: 10.0000 mg | ORAL_TABLET | Freq: Every day | ORAL | 1 refills | Status: DC

## 2022-05-21 ENCOUNTER — Other Ambulatory Visit: Payer: Self-pay | Admitting: Internal Medicine

## 2022-05-21 DIAGNOSIS — E785 Hyperlipidemia, unspecified: Secondary | ICD-10-CM

## 2022-06-15 ENCOUNTER — Other Ambulatory Visit: Payer: Self-pay | Admitting: Internal Medicine

## 2022-06-15 DIAGNOSIS — I10 Essential (primary) hypertension: Secondary | ICD-10-CM

## 2022-06-16 NOTE — Progress Notes (Signed)
Surgical Instructions    Your procedure is scheduled on 06/22/22.  Report to [facility] Main Entrance "A" at 10:30 A.M., then check in with the Admitting office.  Call this number if you have problems the morning of surgery:  [phone]   If you have any questions prior to your surgery date call [phone]: Open Monday-Friday 8am-4pm    Remember:  Do not eat after midnight the night before your surgery  You may drink clear liquids until 9:30am the morning of your surgery.   Clear liquids allowed are: Water, Non-Citrus Juices (without pulp), Carbonated Beverages, Clear Tea, Black Coffee ONLY (NO MILK, CREAM OR POWDERED CREAMER of any kind), and Gatorade  Patient Instructions  The night before surgery:  No food after midnight. ONLY clear liquids after midnight    The day of surgery (if you have diabetes): Drink ONE (1) 12 oz G2 given to you in your pre admission testing appointment by 9:30am the morning of surgery. Drink in one sitting. Do not sip.  This drink was given to you during your hospital  pre-op appointment visit.  Nothing else to drink after completing the  12 oz bottle of G2.         If you have questions, please contact your surgeon's office.     Take these medicines the morning of surgery with A SIP OF WATER:  atorvastatin (LIPITOR) cetirizine (ZYRTEC ALLERGY) metoprolol succinate (TOPROL-XL) pantoprazole (PROTONIX)   As of today, STOP taking any Aspirin (unless otherwise instructed by your surgeon) Aleve, Naproxen, Ibuprofen, Motrin, Advil, Goody's, BC's, all herbal medications, fish oil, and all vitamins.  WHAT DO I DO ABOUT MY DIABETES MEDICATION?   Do not take oral diabetes medicines (pills) the morning of surgery.  Hold dapagliflozin propanediol (FARXIGA) 72 hours prior to surgery. Your last dose will be 06/18/2022.  THE MORNING OF SURGERY, do not take JANUVIA or metFORMIN (GLUCOPHAGE).  The day of surgery, do not take other diabetes injectables,  including Byetta (exenatide), Bydureon (exenatide ER), Victoza (liraglutide), or Trulicity (dulaglutide).  If your CBG is greater than 220 mg/dL, you may take  of your sliding scale (correction) dose of insulin.   HOW TO MANAGE YOUR DIABETES BEFORE AND AFTER SURGERY  Why is it important to control my blood sugar before and after surgery? Improving blood sugar levels before and after surgery helps healing and can limit problems. A way of improving blood sugar control is eating a healthy diet by:  Eating less sugar and carbohydrates  Increasing activity/exercise  Talking with your doctor about reaching your blood sugar goals High blood sugars (greater than 180 mg/dL) can raise your risk of infections and slow your recovery, so you will need to focus on controlling your diabetes during the weeks before surgery. Make sure that the doctor who takes care of your diabetes knows about your planned surgery including the date and location.  How do I manage my blood sugar before surgery? Check your blood sugar at least 4 times a day, starting 2 days before surgery, to make sure that the level is not too high or low.  Check your blood sugar the morning of your surgery when you wake up and every 2 hours until you get to the Short Stay unit.  If your blood sugar is less than 70 mg/dL, you will need to treat for low blood sugar: Do not take insulin. Treat a low blood sugar (less than 70 mg/dL) with  cup of clear juice (cranberry or apple), 4  glucose tablets, OR glucose gel. Recheck blood sugar in 15 minutes after treatment (to make sure it is greater than 70 mg/dL). If your blood sugar is not greater than 70 mg/dL on recheck, call [phone] for further instructions. Report your blood sugar to the short stay nurse when you get to Short Stay.  If you are admitted to the hospital after surgery: Your blood sugar will be checked by the staff and you will probably be given insulin after surgery (instead  of oral diabetes medicines) to make sure you have good blood sugar levels. The goal for blood sugar control after surgery is 80-180 mg/dL.  Do not wear jewelry or makeup. Do not wear lotions, powders, perfumes/cologne or deodorant. Men may shave face and neck. Do not bring valuables to the hospital. Do not wear nail polish, gel polish, artificial nails, or any other type of covering on natural nails (fingers and toes) If you have artificial nails or gel coating that need to be removed by a nail salon, please have this removed prior to surgery. Artificial nails or gel coating may interfere with anesthesia's ability to adequately monitor your vital signs.  [facility] is not responsible for any belongings or valuables.    Do NOT Smoke (Tobacco/Vaping)  24 hours prior to your procedure  If you use a CPAP at night, you may bring your mask for your overnight stay.   Contacts, glasses, hearing aids, dentures or partials may not be worn into surgery, please bring cases for these belongings   For patients admitted to the hospital, discharge time will be determined by your treatment team.   Patients discharged the day of surgery will not be allowed to drive home, and someone needs to stay with them for 24 hours.   SURGICAL WAITING ROOM VISITATION Patients having surgery or a procedure may have no more than 2 support people in the waiting area - these visitors may rotate.   Children under the age of 58 must have an adult with them who is not the patient. If the patient needs to stay at the hospital during part of their recovery, the visitor guidelines for inpatient rooms apply. Pre-op nurse will coordinate an appropriate time for 1 support person to accompany patient in pre-op.  This support person may not rotate.   Please refer to the [facility] website for the visitor guidelines for Inpatients (after your surgery is over and you are in a regular room).    Special instructions:    Oral  Hygiene is also important to reduce your risk of infection.  Remember - BRUSH YOUR TEETH THE MORNING OF SURGERY WITH YOUR REGULAR TOOTHPASTE   [facility]- Preparing For Surgery  Before surgery, you can play an important role. Because skin is not sterile, your skin needs to be as free of germs as possible. You can reduce the number of germs on your skin by washing with CHG (chlorahexidine gluconate) Soap before surgery.  CHG is an antiseptic cleaner which kills germs and bonds with the skin to continue killing germs even after washing.     Please do not use if you have an allergy to CHG or antibacterial soaps. If your skin becomes reddened/irritated stop using the CHG.  Do not shave (including legs and underarms) for at least 48 hours prior to first CHG shower. It is OK to shave your face.  Please follow these instructions carefully.     Shower the NIGHT BEFORE SURGERY and the MORNING OF SURGERY with CHG  Soap.   If you chose to wash your hair, wash your hair first as usual with your normal shampoo. After you shampoo, rinse your hair and body thoroughly to remove the shampoo.  Then [employer] and genitals (private parts) with your normal soap and rinse thoroughly to remove soap.  After that Use CHG Soap as you would any other liquid soap. You can apply CHG directly to the skin and wash gently with a scrungie or a clean washcloth.   Apply the CHG Soap to your body ONLY FROM THE NECK DOWN.  Do not use on open wounds or open sores. Avoid contact with your eyes, ears, mouth and genitals (private parts). Wash Face and genitals (private parts)  with your normal soap.   Wash thoroughly, paying special attention to the area where your surgery will be performed.  Thoroughly rinse your body with warm water from the neck down.  DO NOT shower/wash with your normal soap after using and rinsing off the CHG Soap.  Pat yourself dry with a CLEAN TOWEL.  Wear CLEAN PAJAMAS to bed the night before  surgery  Place CLEAN SHEETS on your bed the night before your surgery  DO NOT SLEEP WITH PETS.   Day of Surgery: Take a shower with CHG soap. Wear Clean/Comfortable clothing the morning of surgery Do not apply any deodorants/lotions.   Remember to brush your teeth WITH YOUR REGULAR TOOTHPASTE.    If you received a COVID test during your pre-op visit, it is requested that you wear a mask when out in public, stay away from anyone that may not be feeling well, and notify your surgeon if you develop symptoms. If you have been in contact with anyone that has tested positive in the last 10 days, please notify your surgeon.    Please read over the following fact sheets that you were given.

## 2022-06-19 ENCOUNTER — Encounter (HOSPITAL_COMMUNITY)
Admission: RE | Admit: 2022-06-19 | Discharge: 2022-06-19 | Disposition: A | Discharge status: Home / Self Care | Payer: Medicare Other | Source: Ambulatory Visit | Attending: General Surgery | Admitting: General Surgery

## 2022-06-19 ENCOUNTER — Encounter (HOSPITAL_COMMUNITY): Payer: Self-pay

## 2022-06-19 ENCOUNTER — Other Ambulatory Visit: Payer: Self-pay

## 2022-06-19 VITALS — BP 133/82 | HR 80 | Temp 97.8°F | Resp 17 | Ht 62.0 in | Wt 134.1 lb

## 2022-06-19 DIAGNOSIS — Z01818 Encounter for other preprocedural examination: Secondary | ICD-10-CM | POA: Insufficient documentation

## 2022-06-19 DIAGNOSIS — R809 Proteinuria, unspecified: Secondary | ICD-10-CM

## 2022-06-19 DIAGNOSIS — E1129 Type 2 diabetes mellitus with other diabetic kidney complication: Secondary | ICD-10-CM | POA: Insufficient documentation

## 2022-06-19 LAB — CBC
HCT: 43.6 % (ref 39.0–52.0)
Hemoglobin: 14.6 g/dL (ref 13.0–17.0)
MCH: 28.4 pg (ref 26.0–34.0)
MCHC: 33.5 g/dL (ref 30.0–36.0)
MCV: 84.8 fL (ref 80.0–100.0)
Platelets: 120 10*3/uL — ABNORMAL LOW (ref 150–400)
RBC: 5.14 MIL/uL (ref 4.22–5.81)
RDW: 13 % (ref 11.5–15.5)
WBC: 5.2 10*3/uL (ref 4.0–10.5)
nRBC: 0 % (ref 0.0–0.2)

## 2022-06-19 LAB — BASIC METABOLIC PANEL
Anion gap: 10 (ref 5–15)
BUN: 11 mg/dL (ref 6–20)
CO2: 26 mmol/L (ref 22–32)
Calcium: 9.6 mg/dL (ref 8.9–10.3)
Chloride: 102 mmol/L (ref 98–111)
Creatinine, Ser: 0.98 mg/dL (ref 0.61–1.24)
GFR, Estimated: >60 mL/min (ref >60)
Glucose, Bld: 180 mg/dL — ABNORMAL HIGH (ref 70–99)
Potassium: 4.1 mmol/L (ref 3.5–5.1)
Sodium: 138 mmol/L (ref 135–145)

## 2022-06-19 LAB — HEMOGLOBIN A1C
Hgb A1c MFr Bld: 7.8 % — ABNORMAL HIGH (ref 4.8–5.6)
Mean Plasma Glucose: 177.16 mg/dL

## 2022-06-19 LAB — GLUCOSE, CAPILLARY: Glucose-Capillary: 169 mg/dL — ABNORMAL HIGH (ref 70–99)

## 2022-06-19 NOTE — Progress Notes (Signed)
PCP - Earl Lagos MD Cardiologist - denies  PPM/ICD - denies  Chest x-ray - n/a EKG - pending Stress Test - n/a ECHO - n/a Cardiac Cath - n/a  Sleep Study - denies  Fasting Blood Sugar - close to 200 (per sister) Checks Blood Sugar 2 times a week  As of today, STOP taking any Aspirin (unless otherwise instructed by your surgeon) Aleve, Naproxen, Ibuprofen, Motrin, Advil, Goody's, BC's, all herbal medications, fish oil, and all vitamins.  ERAS Protcol - yes PRE-SURGERY Ensure or G2- G2  COVID TEST- n/a   Anesthesia review: n/a  Patient denies shortness of breath, fever, cough and chest pain at PAT appointment   All instructions explained to the patient, with a verbal understanding of the material. Patient agrees to go over the instructions while at home for a better understanding. Patient also instructed to self quarantine after being tested for COVID-19. The opportunity to ask questions was provided.

## 2022-06-21 NOTE — H&P (Signed)
Chief Complaint: Hernia       History of Present Illness: Larry Wiley is a 56 y.o. male who is seen today for for follow-up for bilateral hernias. Patient has 2 large bilateral hernias.  I spoke with him and his sister about these previously and repair.  Patient states he is ready to proceed with surgery at this point.   Patient be a candidate for staged hernia repair.  These are both fairly large and would require open inguinal hernia repair with mesh.   He had no signs or symptoms of incarceration or strangulation.  He states he is ready to have these repaired..     Review of Systems: A complete review of systems was obtained from the patient.  I have reviewed this information and discussed as appropriate with the patient.  See HPI as well for other ROS.   Review of Systems  Constitutional:  Negative for fever.  HENT:  Negative for congestion.   Eyes:  Negative for blurred vision.  Respiratory:  Negative for cough, shortness of breath and wheezing.   Cardiovascular:  Negative for chest pain and palpitations.  Gastrointestinal:  Negative for heartburn.  Genitourinary:  Negative for dysuria.  Musculoskeletal:  Negative for myalgias.  Skin:  Negative for rash.  Neurological:  Negative for dizziness and headaches.  Psychiatric/Behavioral:  Negative for depression and suicidal ideas.   All other systems reviewed and are negative.      Medical History: Past Medical History Past Medical History: Diagnosis        Date            Diabetes mellitus without complication (CMS-HCC)               GERD (gastroesophageal reflux disease)              There is no problem list on file for this patient.     Past Surgical History Past Surgical History: Procedure       Laterality         Date            laser eye surgery                            Allergies Allergies Allergen           Reactions            Amoxicillin-Pot Clavulanate    Hives       Current Outpatient Medications  on File Prior to Visit Medication       Sig       Dispense         Refill            atorvastatin (LIPITOR) 40 MG tablet  Take 1 tablet by mouth once daily                               cetirizine (ZYRTEC) 10 mg capsule   Take 10 mg by mouth once daily                                FARXIGA 5 mg Tab tablet      TAKE 1 TABLET BY MOUTH DAILY BEFORE BREAKFAST.  hydroCHLOROthiazide (MICROZIDE) 12.5 mg capsule       Take 12.5 mg by mouth once daily                                 latanoprost (XALATAN) 0.005 % ophthalmic solution           INSTILL 1 DROP INTO BOTH EYES DAILY                              metFORMIN (GLUCOPHAGE) 1000 MG tablet        TAKE 1 TABLET (1,000 MG TOTAL) BY MOUTH 2 (TWO) TIMES DAILY WITH A MEAL.                                  metoprolol succinate (TOPROL-XL) 25 MG XL tablet           Take 12.5 mg by mouth once daily                                 metroNIDAZOLE (METROGEL) 0.75 % (37.5mg /5 gram) vaginal gel         Place 1 applicator vaginally 2 (two) times daily                                pantoprazole (PROTONIX) 20 MG DR tablet            Take 1 tablet by mouth once daily                             SITagliptin phosphate (JANUVIA) 100 MG tablet      Take 1 tablet by mouth once daily                    No current facility-administered medications on file prior to visit.     Family History Family History Problem           Relation           Age of Onset            Stroke  Mother              Obesity            Father               High blood pressure (Hypertension)   Father               Hyperlipidemia (Elevated cholesterol)            Father               Diabetes          Father               Myocardial Infarction (Heart attack)   Father               Diabetes          Sister                Hyperlipidemia (Elevated cholesterol)            Sister  High blood pressure (Hypertension)   Sister           Social History    Tobacco Use Smoking Status           Never Smokeless Tobacco   Never     Social History Social History     Socioeconomic History            Marital status:  Single Tobacco Use            Smoking status:          Never            Smokeless tobacco:    Never       Objective:     There were no vitals filed for this visit.  There is no height or weight on file to calculate BMI.   Physical Exam Constitutional:      Appearance: Normal appearance.  HENT:     Head: Normocephalic and atraumatic.     Nose: Nose normal. No congestion.     Mouth/Throat:     Mouth: Mucous membranes are moist.     Pharynx: Oropharynx is clear.  Eyes:     Pupils: Pupils are equal, round, and reactive to light.  Cardiovascular:     Rate and Rhythm: Normal rate and regular rhythm.     Pulses: Normal pulses.     Heart sounds: Normal heart sounds. No murmur heard.   No friction rub. No gallop.  Pulmonary:     Effort: Pulmonary effort is normal. No respiratory distress.     Breath sounds: Normal breath sounds. No stridor. No wheezing, rhonchi or rales.  Abdominal:     General: Abdomen is flat.     Hernia: A hernia is present. Hernia is present in the left inguinal area and right inguinal area.  Musculoskeletal:        General: Normal range of motion.     Cervical back: Normal range of motion.  Skin:    General: Skin is warm and dry.  Neurological:     General: No focal deficit present.     Mental Status: He is alert and oriented to person, place, and time.  Psychiatric:        Mood and Affect: Mood normal.        Thought Content: Thought content normal.      Assessment and Plan:    Diagnoses and all orders for this visit:   Non-recurrent bilateral inguinal hernia without obstruction or gangrene       Patient with bilateral inguinal hernias.  These are both very large.  At this time would recommend stage open hernia repair with mesh. 1.  We will proceed to the operating for an open  right inguinal hernia repair with mesh. 2.  I discussed with him the risk benefits of the procedure to include but not limited to: Infection, bleeding, damage structures, possible recurrence.  Patient and sister both voiced understanding wished to proceed. 3.  Once he has healed from the right inguinal hernia we can discuss scheduling the left inguinal hernia, in the open fashion as well.

## 2022-06-22 ENCOUNTER — Encounter (HOSPITAL_COMMUNITY)
Admission: RE | Disposition: A | Discharge status: Home / Self Care | Payer: Self-pay | Source: Ambulatory Visit | Attending: General Surgery

## 2022-06-22 ENCOUNTER — Other Ambulatory Visit: Payer: Self-pay

## 2022-06-22 ENCOUNTER — Encounter (HOSPITAL_COMMUNITY): Payer: Self-pay | Admitting: General Surgery

## 2022-06-22 ENCOUNTER — Ambulatory Visit (HOSPITAL_BASED_OUTPATIENT_CLINIC_OR_DEPARTMENT_OTHER): Payer: Medicare Other | Admitting: Anesthesiology

## 2022-06-22 ENCOUNTER — Ambulatory Visit (HOSPITAL_COMMUNITY): Payer: Medicare Other | Admitting: Vascular Surgery

## 2022-06-22 ENCOUNTER — Ambulatory Visit (HOSPITAL_COMMUNITY)
Admission: RE | Admit: 2022-06-22 | Discharge: 2022-06-22 | Disposition: A | Discharge status: Home / Self Care | Payer: Medicare Other | Source: Ambulatory Visit | Attending: General Surgery | Admitting: General Surgery

## 2022-06-22 DIAGNOSIS — Z79899 Other long term (current) drug therapy: Secondary | ICD-10-CM | POA: Insufficient documentation

## 2022-06-22 DIAGNOSIS — K219 Gastro-esophageal reflux disease without esophagitis: Secondary | ICD-10-CM | POA: Diagnosis not present

## 2022-06-22 DIAGNOSIS — K4090 Unilateral inguinal hernia, without obstruction or gangrene, not specified as recurrent: Secondary | ICD-10-CM | POA: Diagnosis not present

## 2022-06-22 DIAGNOSIS — Z7984 Long term (current) use of oral hypoglycemic drugs: Secondary | ICD-10-CM

## 2022-06-22 DIAGNOSIS — I10 Essential (primary) hypertension: Secondary | ICD-10-CM | POA: Diagnosis not present

## 2022-06-22 DIAGNOSIS — E1165 Type 2 diabetes mellitus with hyperglycemia: Secondary | ICD-10-CM | POA: Insufficient documentation

## 2022-06-22 HISTORY — PX: INGUINAL HERNIA REPAIR: SHX194

## 2022-06-22 LAB — GLUCOSE, CAPILLARY
Glucose-Capillary: 187 mg/dL — ABNORMAL HIGH (ref 70–99)
Glucose-Capillary: 191 mg/dL — ABNORMAL HIGH (ref 70–99)
Glucose-Capillary: 157 mg/dL — ABNORMAL HIGH (ref 70–99)

## 2022-06-22 SURGERY — REPAIR, HERNIA, INGUINAL, ADULT
Anesthesia: General | Site: Inguinal | Laterality: Right

## 2022-06-22 MED ORDER — ONDANSETRON HCL 4 MG/2ML IJ SOLN
INTRAMUSCULAR | Status: AC
  Filled 2022-06-22: qty 2

## 2022-06-22 MED ORDER — FENTANYL CITRATE (PF) 250 MCG/5ML IJ SOLN
INTRAMUSCULAR | Status: DC | PRN
Start: 2022-06-22 — End: 2022-06-22
  Administered 2022-06-22: 100 ug via INTRAVENOUS

## 2022-06-22 MED ORDER — INSULIN ASPART 100 UNIT/ML IJ SOLN
0.0000 [IU] | INTRAMUSCULAR | Status: AC | PRN
  Administered 2022-06-22 (×2): 4 [IU] via SUBCUTANEOUS

## 2022-06-22 MED ORDER — ACETAMINOPHEN 500 MG PO TABS
1000.0000 mg | ORAL_TABLET | ORAL | Status: AC
  Administered 2022-06-22: 1000 mg via ORAL
  Filled 2022-06-22: qty 2

## 2022-06-22 MED ORDER — CHLORHEXIDINE GLUCONATE CLOTH 2 % EX PADS: 6.0000 | MEDICATED_PAD | Freq: Once | CUTANEOUS | Status: DC

## 2022-06-22 MED ORDER — MIDAZOLAM HCL 2 MG/2ML IJ SOLN
INTRAMUSCULAR | Status: DC | PRN
  Administered 2022-06-22: 2 mg via INTRAVENOUS

## 2022-06-22 MED ORDER — FENTANYL CITRATE (PF) 250 MCG/5ML IJ SOLN
INTRAMUSCULAR | Status: AC
  Filled 2022-06-22: qty 5

## 2022-06-22 MED ORDER — ONDANSETRON HCL 4 MG/2ML IJ SOLN
INTRAMUSCULAR | Status: DC | PRN
  Administered 2022-06-22: 4 mg via INTRAVENOUS

## 2022-06-22 MED ORDER — AMISULPRIDE (ANTIEMETIC) 5 MG/2ML IV SOLN: 10.0000 mg | Freq: Once | INTRAVENOUS | Status: DC | PRN

## 2022-06-22 MED ORDER — LACTATED RINGERS IV SOLN: INTRAVENOUS | Status: DC

## 2022-06-22 MED ORDER — PROPOFOL 10 MG/ML IV BOLUS
INTRAVENOUS | Status: AC
  Filled 2022-06-22: qty 20

## 2022-06-22 MED ORDER — LIDOCAINE 2% (20 MG/ML) 5 ML SYRINGE
INTRAMUSCULAR | Status: AC
  Filled 2022-06-22: qty 5

## 2022-06-22 MED ORDER — PROPOFOL 10 MG/ML IV BOLUS
INTRAVENOUS | Status: DC | PRN
  Administered 2022-06-22: 150 mg via INTRAVENOUS

## 2022-06-22 MED ORDER — OXYCODONE HCL 5 MG/5ML PO SOLN: 5.0000 mg | Freq: Once | ORAL | Status: AC | PRN

## 2022-06-22 MED ORDER — BUPIVACAINE HCL (PF) 0.25 % IJ SOLN
INTRAMUSCULAR | Status: AC
  Filled 2022-06-22: qty 30

## 2022-06-22 MED ORDER — ORAL CARE MOUTH RINSE: 15.0000 mL | Freq: Once | OROMUCOSAL | Status: AC

## 2022-06-22 MED ORDER — VANCOMYCIN HCL IN DEXTROSE 1-5 GM/200ML-% IV SOLN
1000.0000 mg | INTRAVENOUS | Status: AC
  Administered 2022-06-22: 1000 mg via INTRAVENOUS
  Filled 2022-06-22: qty 200

## 2022-06-22 MED ORDER — ONDANSETRON HCL 4 MG/2ML IJ SOLN: 4.0000 mg | Freq: Once | INTRAMUSCULAR | Status: DC | PRN

## 2022-06-22 MED ORDER — LIDOCAINE 2% (20 MG/ML) 5 ML SYRINGE
INTRAMUSCULAR | Status: DC | PRN
  Administered 2022-06-22: 60 mg via INTRAVENOUS

## 2022-06-22 MED ORDER — CHLORHEXIDINE GLUCONATE 0.12 % MT SOLN
15.0000 mL | Freq: Once | OROMUCOSAL | Status: AC
Start: 2022-06-22 — End: 2022-06-22
  Administered 2022-06-22: 15 mL via OROMUCOSAL
  Filled 2022-06-22: qty 15

## 2022-06-22 MED ORDER — 0.9 % SODIUM CHLORIDE (POUR BTL) OPTIME
TOPICAL | Status: DC | PRN
  Administered 2022-06-22: 1000 mL

## 2022-06-22 MED ORDER — ROCURONIUM BROMIDE 10 MG/ML (PF) SYRINGE
PREFILLED_SYRINGE | INTRAVENOUS | Status: DC | PRN
  Administered 2022-06-22: 70 mg via INTRAVENOUS

## 2022-06-22 MED ORDER — KETOROLAC TROMETHAMINE 30 MG/ML IJ SOLN
INTRAMUSCULAR | Status: AC
  Filled 2022-06-22: qty 1

## 2022-06-22 MED ORDER — DEXAMETHASONE SODIUM PHOSPHATE 10 MG/ML IJ SOLN
INTRAMUSCULAR | Status: AC
  Filled 2022-06-22: qty 1

## 2022-06-22 MED ORDER — MIDAZOLAM HCL 2 MG/2ML IJ SOLN
INTRAMUSCULAR | Status: AC
  Filled 2022-06-22: qty 2

## 2022-06-22 MED ORDER — PHENYLEPHRINE 80 MCG/ML (10ML) SYRINGE FOR IV PUSH (FOR BLOOD PRESSURE SUPPORT)
PREFILLED_SYRINGE | INTRAVENOUS | Status: DC | PRN
  Administered 2022-06-22: 80 ug via INTRAVENOUS

## 2022-06-22 MED ORDER — TRAMADOL HCL 50 MG PO TABS: 50.0000 mg | ORAL_TABLET | Freq: Four times a day (QID) | ORAL | 0 refills | Status: DC | PRN

## 2022-06-22 MED ORDER — OXYCODONE HCL 5 MG PO TABS
5.0000 mg | ORAL_TABLET | Freq: Once | ORAL | Status: AC | PRN
  Administered 2022-06-22: 5 mg via ORAL

## 2022-06-22 MED ORDER — ENSURE PRE-SURGERY PO LIQD: 296.0000 mL | Freq: Once | ORAL | Status: DC

## 2022-06-22 MED ORDER — SUGAMMADEX SODIUM 200 MG/2ML IV SOLN
INTRAVENOUS | Status: DC | PRN
  Administered 2022-06-22: 300 mg via INTRAVENOUS

## 2022-06-22 MED ORDER — ROCURONIUM BROMIDE 10 MG/ML (PF) SYRINGE
PREFILLED_SYRINGE | INTRAVENOUS | Status: AC
  Filled 2022-06-22: qty 10

## 2022-06-22 MED ORDER — KETOROLAC TROMETHAMINE 30 MG/ML IJ SOLN
30.0000 mg | Freq: Once | INTRAMUSCULAR | Status: AC | PRN
  Administered 2022-06-22: 30 mg via INTRAVENOUS

## 2022-06-22 MED ORDER — OXYCODONE HCL 5 MG PO TABS
ORAL_TABLET | ORAL | Status: AC
  Filled 2022-06-22: qty 1

## 2022-06-22 MED ORDER — INSULIN ASPART 100 UNIT/ML IJ SOLN
INTRAMUSCULAR | Status: AC
  Filled 2022-06-22: qty 1

## 2022-06-22 MED ORDER — BUPIVACAINE HCL (PF) 0.25 % IJ SOLN
INTRAMUSCULAR | Status: DC | PRN
  Administered 2022-06-22: 10 mL

## 2022-06-22 MED ORDER — HYDROMORPHONE HCL 1 MG/ML IJ SOLN: 0.2500 mg | INTRAMUSCULAR | Status: DC | PRN

## 2022-06-22 SURGICAL SUPPLY — 46 items
BAG COUNTER SPONGE SURGICOUNT (BAG) ×1 IMPLANT
BLADE CLIPPER SURG (BLADE) IMPLANT
CANISTER SUCT 3000ML PPV (MISCELLANEOUS) IMPLANT
CHLORAPREP W/TINT 26 (MISCELLANEOUS) ×1 IMPLANT
COVER SURGICAL LIGHT HANDLE (MISCELLANEOUS) ×1 IMPLANT
DERMABOND ADVANCED (GAUZE/BANDAGES/DRESSINGS) ×1
DERMABOND ADVANCED .7 DNX12 (GAUZE/BANDAGES/DRESSINGS) ×1 IMPLANT
DRAIN PENROSE 1/2X12 LTX STRL (WOUND CARE) IMPLANT
DRAPE LAPAROSCOPIC ABDOMINAL (DRAPES) ×1 IMPLANT
ELECT REM PT RETURN 9FT ADLT (ELECTROSURGICAL) ×1
ELECTRODE REM PT RTRN 9FT ADLT (ELECTROSURGICAL) ×1 IMPLANT
GAUZE 4X4 16PLY ~~LOC~~+RFID DBL (SPONGE) ×1 IMPLANT
GLOVE BIO SURGEON STRL SZ7.5 (GLOVE) ×1 IMPLANT
GLOVE BIOGEL PI IND STRL 8 (GLOVE) ×1 IMPLANT
GLOVE BIOGEL PI INDICATOR 8 (GLOVE) ×1
GLOVE SURG SYN 7.5  E (GLOVE) ×1
GLOVE SURG SYN 7.5 E (GLOVE) ×1 IMPLANT
GLOVE SURG SYN 7.5 PF PI (GLOVE) ×1 IMPLANT
GOWN STRL REUS W/ TWL LRG LVL3 (GOWN DISPOSABLE) ×1 IMPLANT
GOWN STRL REUS W/ TWL XL LVL3 (GOWN DISPOSABLE) ×1 IMPLANT
GOWN STRL REUS W/TWL LRG LVL3 (GOWN DISPOSABLE) ×1
GOWN STRL REUS W/TWL XL LVL3 (GOWN DISPOSABLE) ×1
KIT BASIN OR (CUSTOM PROCEDURE TRAY) ×1 IMPLANT
KIT TURNOVER KIT B (KITS) ×1 IMPLANT
MESH PARIETEX PROGRIP RIGHT (Mesh General) IMPLANT
NDL HYPO 25GX1X1/2 BEV (NEEDLE) ×1 IMPLANT
NEEDLE HYPO 25GX1X1/2 BEV (NEEDLE) ×1 IMPLANT
NS IRRIG 1000ML POUR BTL (IV SOLUTION) ×1 IMPLANT
PACK GENERAL/GYN (CUSTOM PROCEDURE TRAY) ×1 IMPLANT
PAD ARMBOARD 7.5X6 YLW CONV (MISCELLANEOUS) ×2 IMPLANT
PENCIL SMOKE EVACUATOR (MISCELLANEOUS) ×1 IMPLANT
SPONGE INTESTINAL PEANUT (DISPOSABLE) IMPLANT
SUT MNCRL AB 4-0 PS2 18 (SUTURE) ×1 IMPLANT
SUT PROLENE 2 0 SH DA (SUTURE) ×1 IMPLANT
SUT SILK 0 TIES 10X30 (SUTURE) ×1 IMPLANT
SUT VIC AB 2-0 SH 27 (SUTURE) ×1
SUT VIC AB 2-0 SH 27X BRD (SUTURE) ×1 IMPLANT
SUT VIC AB 3-0 SH 27 (SUTURE) ×1
SUT VIC AB 3-0 SH 27XBRD (SUTURE) ×1 IMPLANT
SUT VICRYL AB 2 0 TIES (SUTURE) ×1 IMPLANT
SYR CONTROL 10ML LL (SYRINGE) ×1 IMPLANT
SYRINGE TOOMEY DISP (SYRINGE) ×1 IMPLANT
TOWEL GREEN STERILE (TOWEL DISPOSABLE) ×1 IMPLANT
TOWEL GREEN STERILE FF (TOWEL DISPOSABLE) ×1 IMPLANT
TRAY FOL W/BAG SLVR 16FR STRL (SET/KITS/TRAYS/PACK) ×1 IMPLANT
TRAY FOLEY W/BAG SLVR 16FR LF (SET/KITS/TRAYS/PACK) ×1

## 2022-06-22 NOTE — Interval H&P Note (Signed)
History and Physical Interval Note:  06/22/2022 9:58 AM  Larry Wiley  has presented today for surgery, with the diagnosis of RIGHT INGUINAL HERNIA.  The various methods of treatment have been discussed with the patient and family. After consideration of risks, benefits and other options for treatment, the patient has consented to  Procedure(s): OPEN RIGHT INGUINAL HERNIA REPAIR WITH MESH (Right) as a surgical intervention.  The patient's history has been reviewed, patient examined, no change in status, stable for surgery.  I have reviewed the patient's chart and labs.  Questions were answered to the patient's satisfaction.     Axel Filler

## 2022-06-22 NOTE — Anesthesia Preprocedure Evaluation (Addendum)
Anesthesia Evaluation  Patient identified by MRN, date of birth, ID band Patient awake    Reviewed: Allergy & Precautions, NPO status , Patient's Chart, lab work & pertinent test results, reviewed documented beta blocker date and time   Airway Mallampati: II  TM Distance: >3 FB Neck ROM: Full   Comment: High, narrow palate Dental no notable dental hx. (+) Missing, Dental Advisory Given,    Pulmonary neg pulmonary ROS,    Pulmonary exam normal breath sounds clear to auscultation       Cardiovascular hypertension (158/99 in preop), Pt. on medications and Pt. on home beta blockers Normal cardiovascular exam Rhythm:Regular Rate:Normal     Neuro/Psych negative neurological ROS  negative psych ROS   GI/Hepatic Neg liver ROS, GERD  Controlled,  Endo/Other  diabetes, Poorly Controlled, Type 2, Oral Hypoglycemic Agentsfs 187 in preop- treated w/ insulin preop   Renal/GU negative Renal ROS  negative genitourinary   Musculoskeletal negative musculoskeletal ROS (+)   Abdominal   Peds  Hematology Hb 14.6, plt 120   Anesthesia Other Findings Right inguinal hernia   Reproductive/Obstetrics negative OB ROS                            Anesthesia Physical Anesthesia Plan  ASA: 3  Anesthesia Plan: General   Post-op Pain Management: Ofirmev IV (intra-op)* and Toradol IV (intra-op)*   Induction: Intravenous  PONV Risk Score and Plan: 3 and Ondansetron, Dexamethasone, Midazolam and Treatment may vary due to age or medical condition  Airway Management Planned: Oral ETT  Additional Equipment: None  Intra-op Plan:   Post-operative Plan: Extubation in OR  Informed Consent: I have reviewed the patients History and Physical, chart, labs and discussed the procedure including the risks, benefits and alternatives for the proposed anesthesia with the patient or authorized representative who has indicated his/her  understanding and acceptance.     Dental advisory given  Plan Discussed with: CRNA  Anesthesia Plan Comments: (Possible glidescope)       Anesthesia Quick Evaluation

## 2022-06-22 NOTE — Discharge Instructions (Addendum)
[facility] _______[facility] ? ?INGUINAL HERNIA REPAIR: POST OP INSTRUCTIONS ? ?Always review your discharge instruction sheet given to you by the facility where your surgery was performed. ?IF YOU HAVE DISABILITY OR FAMILY LEAVE FORMS, YOU MUST BRING THEM TO THE OFFICE FOR PROCESSING.   ?DO NOT GIVE THEM TO YOUR DOCTOR. ? ?1. A  prescription for pain medication may be given to you upon discharge.  Take your pain medication as prescribed, if needed.  If narcotic pain medicine is not needed, then you may take acetaminophen (Tylenol) or ibuprofen (Advil) as needed. ?2. Take your usually prescribed medications unless otherwise directed. ?If you need a refill on your pain medication, please contact your pharmacy.  They will contact our office to request authorization. Prescriptions will not be filled after 5 pm or on week-ends. ?3. You should follow a light diet the first 24 hours after arrival home, such as soup and crackers, etc.  Be sure to include lots of fluids daily.  Resume your normal diet the day after surgery. ?4.Most patients will experience some swelling and bruising around the umbilicus or in the groin and scrotum.  Ice packs and reclining will help.  Swelling and bruising can take several days to resolve.  ?6. It is common to experience some constipation if taking pain medication after surgery.  Increasing fluid intake and taking a stool softener (such as Colace) will usually help or prevent this problem from occurring.  A mild laxative (Milk of Magnesia or Miralax) should be taken according to package directions if there are no bowel movements after 48 hours. ?7. Unless discharge instructions indicate otherwise, you may remove your bandages 24-48 hours after surgery, and you may shower at that time.  You may have steri-strips (small skin tapes) in place directly over the incision.  These strips should be left on the skin for 7-10 days.  If your surgeon used skin glue on the incision, you may  shower in 24 hours.  The glue will flake off over the next 2-3 weeks.  Any sutures or staples will be removed at the office during your follow-up visit. ?8. ACTIVITIES:  You may resume regular (light) daily activities beginning the next day--such as daily self-care, walking, climbing stairs--gradually increasing activities as tolerated.  You may have sexual intercourse when it is comfortable.  Refrain from any heavy lifting or straining until approved by your doctor. ? ?a.You may drive when you are no longer taking prescription pain medication, you can comfortably wear a seatbelt, and you can safely maneuver your car and apply brakes. ?b.RETURN TO WORK:   ?_____________________________________________ ? ?9.You should see your doctor in the office for a follow-up appointment approximately 2-3 weeks after your surgery.  Make sure that you call for this appointment within a day or two after you arrive home to insure a convenient appointment time. ?10.OTHER INSTRUCTIONS: _________________________ ?   _____________________________________ ? ?WHEN TO CALL YOUR DOCTOR: ?Fever over 101.0 ?Inability to urinate ?Nausea and/or vomiting ?Extreme swelling or bruising ?Continued bleeding from incision. ?Increased pain, redness, or drainage from the incision ? ?The clinic staff is available to answer your questions during regular business hours.  Please don?t hesitate to call and ask to speak to one of the nurses for clinical concerns.  If you have a medical emergency, go to the nearest emergency room or call 911.  A surgeon from [facility] is always on call at the hospital ? ? ?[address], [address], [place], [place]    [address] ? ? [address] [address], [place], [place]   [address] ?[phone] ? [phone] ? FAX [phone] ?Web site: [email or web address] ? ?

## 2022-06-22 NOTE — Anesthesia Procedure Notes (Signed)
Procedure Name: Intubation Date/Time: 06/22/2022 11:43 AM  Performed by: Alain Marion, CRNAPre-anesthesia Checklist: Patient identified, Emergency Drugs available, Suction available and Patient being monitored Patient Re-evaluated:Patient Re-evaluated prior to induction Oxygen Delivery Method: Circle System Utilized Preoxygenation: Pre-oxygenation with 100% oxygen Induction Type: IV induction Ventilation: Mask ventilation without difficulty Laryngoscope Size: Mac and 4 Grade View: Grade I Tube type: Oral Tube size: 7.5 mm Number of attempts: 1 Airway Equipment and Method: Stylet and Oral airway Placement Confirmation: ETT inserted through vocal cords under direct vision, positive ETCO2 and breath sounds checked- equal and bilateral Secured at: 21 cm Tube secured with: Tape Dental Injury: Teeth and Oropharynx as per pre-operative assessment  Comments: Performed by SRNA, I. Foster.

## 2022-06-22 NOTE — Anesthesia Postprocedure Evaluation (Signed)
Anesthesia Post Note  Patient: Chanel A Schomburg  Procedure(s) Performed: OPEN RIGHT INGUINAL HERNIA REPAIR WITH MESH (Right: Inguinal)     Patient location during evaluation: PACU Anesthesia Type: General Level of consciousness: awake and alert, oriented and patient cooperative Pain management: pain level controlled Vital Signs Assessment: post-procedure vital signs reviewed and stable Respiratory status: spontaneous breathing, nonlabored ventilation and respiratory function stable Cardiovascular status: blood pressure returned to baseline and stable Postop Assessment: no apparent nausea or vomiting Anesthetic complications: no   No notable events documented.  Last Vitals:  Vitals:   06/22/22 1014 06/22/22 1245  BP: (!) 158/99 (!) 157/91  Pulse: 97 82  Resp:  18  Temp:  36.4 C  SpO2:  96%    Last Pain:  Vitals:   06/22/22 1245  TempSrc:   PainSc: Asleep                 Lannie Fields

## 2022-06-22 NOTE — Transfer of Care (Signed)
Immediate Anesthesia Transfer of Care Note  Patient: [name]  Procedure(s) Performed: OPEN RIGHT INGUINAL HERNIA REPAIR WITH MESH (Right: Inguinal)  Patient Location: PACU  Anesthesia Type:General  Level of Consciousness: drowsy and lethargic  Airway & Oxygen Therapy: Patient Spontanous Breathing and Patient connected to nasal cannula oxygen  Post-op Assessment: Report given to RN and Post -op Vital signs reviewed and stable  Post vital signs: Reviewed and stable  Last Vitals:  Vitals Value Taken Time  BP 157/91 06/22/22 1245  Temp    Pulse 82 06/22/22 1246  Resp 18 06/22/22 1246  SpO2 94 % 06/22/22 1246  Vitals shown include unvalidated device data.  Last Pain:  Vitals:   06/22/22 0952  TempSrc:   PainSc: 0-No pain         Complications: No notable events documented.

## 2022-06-22 NOTE — Op Note (Signed)
06/22/2022  12:20 PM  PATIENT:  Larry Wiley  56 y.o. male  PRE-OPERATIVE DIAGNOSIS:  RIGHT INGUINAL HERNIA  POST-OPERATIVE DIAGNOSIS:  LARGE RIGHT DIRECT INGUINAL HERNIA  PROCEDURE:  Procedure(s): OPEN RIGHT INGUINAL HERNIA REPAIR WITH MESH (Right)  SURGEON:  Surgeon(s) and Role:    * Axel Filler, MD - Primary    ASSISTANTS: Patrici Ranks, RNFA   ANESTHESIA:   local and general  EBL:  minimal   BLOOD ADMINISTERED:none  DRAINS: none   LOCAL MEDICATIONS USED:  BUPIVICAINE   SPECIMEN:  No Specimen  DISPOSITION OF SPECIMEN:  N/A  COUNTS:  YES  TOURNIQUET:  * No tourniquets in log *  DICTATION: .Dragon Dictation Findings: Patient had a very large direct inguinal hernia.  Patient had a extra-large external ring.  A piece of ProGrip mesh was placed.  Details of the procedure: The patient was taken back to the operating room. The patient was placed in supine position with bilateral SCDs in place. The patient was prepped and draped in the usual sterile fashion.  After appropriate anitbiotics were confirmed, a time-out was confirmed and all facts were verified.  Quarter percent Marcaine was used to infiltrate the area of the incision and an ilioinguinal nerve block was also placed.   A 5 cm incision was made just 1 cm superior to the inguinal ligament. Bovie cautery was used to maintain hemostasis dissection is carried down to the external oblique.  A standard incision was made laterally, and the external oblique was bluntly dissected away from the surrounding tissue with Metzenbaum scissors. The external oblique was elevated in the spermatic cord was bluntly dissected away from the surrounding tissue.  The external inguinal ring was extremely large.  The spermatic cord and the hernia were then bluntly dissected away from the pubic tubercle and a Penrose was placed around the hernia sac in the spermatic cord. The vas deferens was identified and protected at all portions of  the case. Dissection of the cremasterics took place with Bovie cautery.  The hernia was cleaned down towards the epigastric vessels.  The hernia was in the direct space.  This was then imbricated using a running 3-0 Vicryl.  At this time a right-sided Progrip mesh was then anchored to the pubic tubercle with a 2-0 Prolene.  It was anchored to the shelving edge of the external oblique x 1 and the conjoint tendon cephalad x 1.  The wrap around of the mesh was sutured to the conjoint tendon as well.  The new internal ring did not strangulate the spermatic cord.   The tail was then tucked under the external oblique. At this time the area was irrigated out with sterile saline.    The external oblique was reapproximated using a 2-0 Vicryl in a running fashion. Scarpa's fascia was then reapproximated using a 3-0 Vicryl running fashion. The skin was then reapproximated with 4 Monocryl in a subcuticular fashion. The skin was then dressed with Dermabond.  The patient was taken to the recovery room in stable condition.      PLAN OF CARE: Discharge to home after PACU  PATIENT DISPOSITION:  PACU - hemodynamically stable.   Delay start of Pharmacological VTE agent (>24hrs) due to surgical blood loss or risk of bleeding: not applicable

## 2022-06-23 ENCOUNTER — Encounter (HOSPITAL_COMMUNITY): Payer: Self-pay | Admitting: General Surgery

## 2022-07-10 DIAGNOSIS — Z23 Encounter for immunization: Secondary | ICD-10-CM | POA: Diagnosis not present

## 2022-07-14 ENCOUNTER — Ambulatory Visit: Payer: Self-pay | Admitting: General Surgery

## 2022-07-14 NOTE — H&P (Signed)
Chief Complaint: Post Operative Visit (Inguinal hernia)       History of Present Illness: Larry Wiley is a 56 y.o. male who is seen today as an office consultation at the request of Dr. Pasty Arch for evaluation of Post Operative Visit (Inguinal hernia) .   Patient is status post open right inguinal hernia repair with mesh.  This was 3 weeks ago.  Patient is doing well had minimal pain.   Patient states he feels a significant difference.  He does have some pain to the left side where he does still have a large hernia.   He is interested in continuing with repair of the left inguinal hernia.     Review of Systems: A complete review of systems was obtained from the patient.  I have reviewed this information and discussed as appropriate with the patient.  See HPI as well for other ROS.   Review of Systems  Constitutional:  Negative for fever.  HENT:  Negative for congestion.   Eyes:  Negative for blurred vision.  Respiratory:  Negative for cough, shortness of breath and wheezing.   Cardiovascular:  Negative for chest pain and palpitations.  Gastrointestinal:  Negative for heartburn.  Genitourinary:  Negative for dysuria.  Musculoskeletal:  Negative for myalgias.  Skin:  Negative for rash.  Neurological:  Negative for dizziness and headaches.  Psychiatric/Behavioral:  Negative for depression and suicidal ideas.   All other systems reviewed and are negative.      Medical History: Past Medical History Past Medical History: Diagnosis Date  Diabetes mellitus without complication (CMS-HCC)    GERD (gastroesophageal reflux disease)        There is no problem list on file for this patient.     Past Surgical History Past Surgical History: Procedure Laterality Date  laser eye surgery          Allergies Allergies Allergen Reactions  Amoxicillin-Pot Clavulanate Hives      Current Outpatient Medications on File Prior to Visit Medication Sig Dispense Refill  atorvastatin  (LIPITOR) 40 MG tablet Take 1 tablet by mouth once daily      cetirizine (ZYRTEC) 10 mg capsule Take 10 mg by mouth once daily      FARXIGA 5 mg Tab tablet TAKE 1 TABLET BY MOUTH DAILY BEFORE BREAKFAST.      hydroCHLOROthiazide (MICROZIDE) 12.5 mg capsule Take 12.5 mg by mouth once daily      latanoprost (XALATAN) 0.005 % ophthalmic solution INSTILL 1 DROP INTO BOTH EYES DAILY      metFORMIN (GLUCOPHAGE) 1000 MG tablet TAKE 1 TABLET (1,000 MG TOTAL) BY MOUTH 2 (TWO) TIMES DAILY WITH A MEAL.      metoprolol succinate (TOPROL-XL) 25 MG XL tablet Take 12.5 mg by mouth once daily      metroNIDAZOLE (METROGEL) 0.75 % (37.5mg /5 gram) vaginal gel Place 1 applicator vaginally 2 (two) times daily      pantoprazole (PROTONIX) 20 MG DR tablet Take 1 tablet by mouth once daily      SITagliptin phosphate (JANUVIA) 100 MG tablet Take 1 tablet by mouth once daily       No current facility-administered medications on file prior to visit.     Family History Family History Problem Relation Age of Onset  Stroke Mother    Obesity Father    High blood pressure (Hypertension) Father    Hyperlipidemia (Elevated cholesterol) Father    Diabetes Father    Myocardial Infarction (Heart attack) Father    Diabetes Sister  Hyperlipidemia (Elevated cholesterol) Sister    High blood pressure (Hypertension) Sister        Social History   Tobacco Use Smoking Status Never Smokeless Tobacco Never     Social History Social History    Socioeconomic History  Marital status: Single Tobacco Use  Smoking status: Never  Smokeless tobacco: Never      Objective:     There were no vitals filed for this visit.  There is no height or weight on file to calculate BMI. Physical Exam Constitutional:      Appearance: Normal appearance.  HENT:     Head: Normocephalic and atraumatic.     Nose: Nose normal. No congestion.     Mouth/Throat:     Mouth: Mucous membranes are moist.     Pharynx: Oropharynx is  clear.  Eyes:     Pupils: Pupils are equal, round, and reactive to light.  Cardiovascular:     Rate and Rhythm: Normal rate and regular rhythm.     Pulses: Normal pulses.     Heart sounds: Normal heart sounds. No murmur heard.   No friction rub. No gallop.  Pulmonary:     Effort: Pulmonary effort is normal. No respiratory distress.     Breath sounds: Normal breath sounds. No stridor. No wheezing, rhonchi or rales.  Abdominal:     General: Abdomen is flat.     Hernia: A hernia (Right incision well-healed, healing ridge.) is present. Hernia is present in the left inguinal area. There is no hernia in the right inguinal area.  Musculoskeletal:        General: Normal range of motion.     Cervical back: Normal range of motion.  Skin:    General: Skin is warm and dry.  Neurological:     General: No focal deficit present.     Mental Status: He is alert and oriented to person, place, and time.  Psychiatric:        Mood and Affect: Mood normal.        Thought Content: Thought content normal.        Assessment and Plan: Diagnoses and all orders for this visit:   Non-recurrent unilateral inguinal hernia without obstruction or gangrene   S/P hernia repair     Larry Wiley is a 56 y.o. male    1.  We will proceed to the OR for a open left inguinal hernia repair with mesh. 2. All risks and benefits were discussed with the patient, to generally include infection, bleeding, damage to surrounding structures, acute and chronic nerve pain, and recurrence. Alternatives were offered and described.  All questions were answered and the patient voiced understanding of the procedure and wishes to proceed at this point.             No follow-ups on file.   Axel Filler, MD, [facility], [place] [facility]

## 2022-07-18 ENCOUNTER — Encounter: Payer: Self-pay | Admitting: Dietician

## 2022-07-18 ENCOUNTER — Ambulatory Visit (INDEPENDENT_AMBULATORY_CARE_PROVIDER_SITE_OTHER): Payer: Medicare Other | Admitting: Dietician

## 2022-07-18 ENCOUNTER — Ambulatory Visit (INDEPENDENT_AMBULATORY_CARE_PROVIDER_SITE_OTHER): Payer: Medicare Other | Admitting: Internal Medicine

## 2022-07-18 ENCOUNTER — Other Ambulatory Visit: Payer: Self-pay | Admitting: Internal Medicine

## 2022-07-18 VITALS — BP 136/79 | HR 79 | Temp 97.5°F | Ht 62.0 in | Wt 131.8 lb

## 2022-07-18 DIAGNOSIS — K4020 Bilateral inguinal hernia, without obstruction or gangrene, not specified as recurrent: Secondary | ICD-10-CM

## 2022-07-18 DIAGNOSIS — I10 Essential (primary) hypertension: Secondary | ICD-10-CM

## 2022-07-18 DIAGNOSIS — R809 Proteinuria, unspecified: Secondary | ICD-10-CM

## 2022-07-18 DIAGNOSIS — E1129 Type 2 diabetes mellitus with other diabetic kidney complication: Secondary | ICD-10-CM | POA: Diagnosis not present

## 2022-07-18 DIAGNOSIS — E7800 Pure hypercholesterolemia, unspecified: Secondary | ICD-10-CM

## 2022-07-18 DIAGNOSIS — Z0000 Encounter for general adult medical examination without abnormal findings: Secondary | ICD-10-CM

## 2022-07-18 DIAGNOSIS — L718 Other rosacea: Secondary | ICD-10-CM | POA: Diagnosis not present

## 2022-07-18 DIAGNOSIS — K219 Gastro-esophageal reflux disease without esophagitis: Secondary | ICD-10-CM

## 2022-07-18 NOTE — Assessment & Plan Note (Signed)
-   This problem is chronic and stable -Patient is on Lipitor 40 mg daily -He is due for recheck of his lipid panel but he just had a BMP done in August and is not due for any other blood work today.  We will recheck his lipid panel and BMP at his next visit in November -No further work-up at this time

## 2022-07-18 NOTE — Progress Notes (Signed)
   Subjective:    Patient ID: Larry Wiley, male    DOB: 09-Mar-1966, 56 y.o.   MRN: [id number]  Diabetes    I have seen and examined this patient.  Patient here for routine follow-up of his hypertension and diabetes.  Patient states that he feels well today denies any new complaints.  He had a recent right inguinal hernia surgery and is healing well from this.  He is scheduled for left inguinal hernia surgery again in December.  He denies any new complaints at this time and is compliant with all his medications.  He and his family are going to [facility] this week and he is excited about this.  Review of Systems  Constitutional: Negative.   HENT: Negative.    Respiratory: Negative.    Cardiovascular: Negative.   Gastrointestinal: Negative.   Musculoskeletal: Negative.   Neurological: Negative.   Psychiatric/Behavioral: Negative.         Objective:   Physical Exam Constitutional:      Appearance: Normal appearance.  HENT:     Head: Normocephalic and atraumatic.  Cardiovascular:     Rate and Rhythm: Normal rate and regular rhythm.     Heart sounds: Normal heart sounds.  Pulmonary:     Effort: No respiratory distress.     Breath sounds: Normal breath sounds. No wheezing.  Abdominal:     General: Bowel sounds are normal. There is no distension.     Palpations: Abdomen is soft.     Tenderness: There is no abdominal tenderness.  Musculoskeletal:        General: No swelling or tenderness.  Skin:    Comments: Patient with reddish discoloration over the tip of his nose as well as some vesicular lesions noted over his anterior left cheek below his left eye consistent with his history of acne  Neurological:     Mental Status: He is alert and oriented to person, place, and time.  Psychiatric:        Mood and Affect: Mood normal.        Behavior: Behavior normal.           Assessment & Plan:   Please see problem based charting for assessment and plan:

## 2022-07-18 NOTE — Assessment & Plan Note (Signed)
-   Patient had a recent hernia repair surgery in August for his right inguinal hernia -He does have persistent left inguinal hernia and is scheduled to have repair of this later this year -States that he has been following up with the surgeon and the wound is healing well -He is avoiding strenuous activity like mowing the lawn till his wound heals completely -No further work-up at this time

## 2022-07-18 NOTE — Patient Instructions (Addendum)
Thank you for your visit today!  We talked about:    You can find a 3x/day pill box on [employer] or [employer]  Continuous glucose monitoring   The medication called GLP-1- they make a weekly injections (it is not insulin, it helps decrease appetite and will not give you low blood sugars)   Chocolate Chia Seed Pudding  Ingredients 1/4 cup unsweetened cocoa powder. 3-5 Tbsp maple syrup. 1/2 tsp ground cinnamon (optional) 1 pinch sea salt. 1/2 tsp vanilla extract. 1 1/2 cups milk or Almond or Soy milk Original Unsweetened (or light coconut milk for creamier texture!) 1/2 cup chia seeds.  Mix all ingredients and stir well.  Let sit for 5 minutes then stir well again.  Refrigerate for 1-2 hours. Enjoy with a dab of whipped topping.  Goals to work on:  Working in healthy fats in snacks 3 grams fiber per slice of bread  Please feel free to call me anytime.  follow up as needed or yearly.   Butch Penny [phone]

## 2022-07-18 NOTE — Assessment & Plan Note (Addendum)
Lab Results  Component Value Date   HGBA1C 7.8 (H) 06/19/2022   HGBA1C 7.7 (A) 04/11/2022   HGBA1C 8.0 (A) 12/13/2021     Assessment: Diabetes control:  Fair Progress toward A1C goal:   Unchanged Comments: Patient is compliant with Januvia 100 mg daily, metformin 1000 mg twice daily as well as Farxiga 10 mg daily.  He had a recent A1c in August before his hernia surgery.  Plan: Medications:  continue current medications Home glucose monitoring: Frequency:   Timing:   Instruction/counseling given: discussed diet Educational resources provided:   Self management tools provided:   Other plans: We will check a urine microalbumin creatinine ratio today.  If he does have proteinuria he may benefit from the addition of a centrally acting calcium general blocker like verapamil since he is unable to tolerate ACE inhibitors (history of angioedema) which may also preclude him from starting an ARB.  Patient also followed up with our diabetes educator today and is going to work on changing his diet

## 2022-07-18 NOTE — Assessment & Plan Note (Signed)
-   This problem is chronic and stable -Patient uses metronidazole gel as needed for this -He currently is having of flareup over his nose as well as on his left cheek and has started using this medication again -States he does not need refills at this time -No further work-up at this

## 2022-07-18 NOTE — Progress Notes (Signed)
Medical Nutrition Therapy:  Appt start time: 0825 end time:  0925 Total time: 60 Visit # 1  Assessment:  Primary concerns today: glycemic control. A1C has been above goal for the past year. Larry Wiley is here with his sister Larry Wiley with whom he resides. He has had diabetes since 2004. He has not had diabetes education or nutrition therapy for diabetes in the past. She cares for him and her spouse, cooking and shopping. He seems to do most of what she asks him to do but she says he snacks a lot after meals and and stays up at night after she goes to bed to watch TV and she thinks he is snacking because she notices food missing and sometime his blood sugars are higher in the morning.  He likes sweet and she keeps both sugar free sweets for him and regular for her spouse in the house. His activity level is lightly active especially in the winter with ~ 5 hours of tv watching per day. I offered PREP exercise classes and professional CGM but his sister says they want to wait until he is cleared from his hernia surgery and after they cmoe back from the beach.  His sister sounds like she likes the idea of changing her brothers medications from a DPP-4 inhibitor to a GLP-1 receptor agonist. She has given injections to her son in the past and her other son uses a CGM. She has diabetes and her husband does as well. Larry Wiley is doing a good job of meal planning for the most part and seems to favor changing her brothers medication over lifestyle.   Preferred Learning Style: No preference indicated  Learning Readiness: Contemplating  ANTHROPOMETRICS: Estimated body mass index is 24.22 kg/m as calculated from the following:   Height as of 06/22/22: 5\' 2"  (1.575 m).   Weight as of this encounter: 132 lb 6.4 oz (60.1 kg).  WEIGHT HISTORY:  Wt Readings from Last 10 Encounters:  07/18/22 132 lb 6.4 oz (60.1 kg)  06/22/22 134 lb (60.8 kg)  06/19/22 134 lb 1.6 oz (60.8 kg)  04/11/22 135 lb 6.4 oz (61.4 kg)  02/07/22 137  lb 1.6 oz (62.2 kg)  12/13/21 138 lb 8 oz (62.8 kg)  11/30/21 137 lb (62.1 kg)  10/31/21 133 lb (60.3 kg)  08/02/21 141 lb 14.4 oz (64.4 kg)  05/30/21 141 lb 9.6 oz (64.2 kg)   SLEEP:well- ~9-10 hours per day  MEDICATIONS: his sister manages using pill boxes and states that he "rarely misses" and she was able to state all of his diabetes medicines. BLOOD SUGAR:Monitoring- 1-2 per week, fasting- 191/165/202/126/169/ Before dinner 171;   After lunch- 168; After dinner-136\ Lab Results  Component Value Date   HGBA1C 7.8 (H) 06/19/2022   HGBA1C 7.7 (A) 04/11/2022   HGBA1C 8.0 (A) 12/13/2021   HGBA1C 6.9 (A) 08/02/2021   HGBA1C 6.6 (A) 12/21/2020     DIETARY INTAKE: Usual eating pattern includes 3 meals and 2 snacks per day. No problems chewing or swallWendy's once every other week, eats a lot of bread and more sandwiches, beans- pintos, northern, baked beans are his favorite, Constipation: no problem. Eats fruit, whole grain, veggies daily, likes  Dining Out : 1x every 2 weeks 24-hr recall:  930-100- light breakfast,-raisin bran and cheerios cereal x1 day, or just 1-2 slices toast sf preserves 3 times per week, egg,,  once a week eggs, sausage and bacon, small glass of juice once a week, water coffee decaf 12-1 pm  Lunch- sausage biscuit and  or sandwich ham or [place], mayonnaise, vegetables like on tomato, chips 3-4 days per week liit portion, water, banana, mayo, peanu tbutter, sandwich  Snack- sf cookies, choc chip cookies, something a little daily  6 pm Supper- heaviest Larry Wiley cooks and shops- 2 veggies, meat like cube steak, pork chops baked, produce apple- daily, orange and tangerines,  Snack-  sf soft drinks sugar free pudding ?problematic  Usual physical activity:- tv 5 hours per day, walks l10-15 minutes 2x/week, helps with chores and grocery shopping, lawn work during the [place], he is restricted from surgery now.   Estimated daily energy  needs: 1600 calories ~150 g carbohydrates  Progress Towards Goal(s):  In progress.   Nutritional Diagnosis:  NI-5.8.2 Excessive carbohydrate intake As related to snacking on higher carb foods.  As evidenced by his a1c/ sporadically elevated blood sugars and his sisters report of food missing.    Intervention:  Nutrition education about incorporating more exercise especially after meals, incorporating healthy fats as snacks to decrease the glycemic burden, Continuous glucose monitoring coverage and benefits.  Action Goal: try to incorporate higher healthy fat foods to help decrease higher carb foods Outcome goal: lower A1C Coordination of care: recommend professional CGM, Trulicity for a GLP-1 RA if needed as patient does not need weight loss  Teaching Method Utilized: Visual, Auditory,Hands on Handouts given during visit include: After visit summary Barriers to learning/adherence to lifestyle change: competing values Demonstrated degree of understanding via:  Teach Back   Monitoring/Evaluation:  Dietary intake, exercise, meter, and body weight  1-2 months or as needed, then annually [employer], RD 07/18/2022 10:06 AM. .

## 2022-07-18 NOTE — Patient Instructions (Signed)
-  It was a pleasure seeing you today -Your blood pressure is controlled.  Keep up the great work! -We will check a urine test on you today to check for protein.  Please call me when you get back from vacation and we can go over the results -We will check some blood work at your next visit in 3 months including your cholesterol and kidney function and A1c. -Please follow-up with the surgeon for surgical repair of your inguinal hernia -Please call me with any questions or concerns or if you need any refills -Have a great time at the beach!

## 2022-07-18 NOTE — Assessment & Plan Note (Signed)
BP Readings from Last 3 Encounters:  07/18/22 136/79  06/22/22 136/89  06/19/22 133/82    Lab Results  Component Value Date   NA 138 06/19/2022   K 4.1 06/19/2022   CREATININE 0.98 06/19/2022    Assessment: Blood pressure control:  Well-controlled Progress toward BP goal:   Near goal Comments: Patient is compliant with Toprol-XL 12.5 mg daily as well as HCTZ 12.5 mg daily   Plan: Medications:  continue current medications Educational resources provided:   Self management tools provided:   Other plans: Patient had recent BMP in August before his hernia surgery that was within normal limits.  We are checking a urine microalbumin creatinine ratio today.  If he does have proteinuria he may benefit from a change in his antihypertensive medication.  He did have angioedema with an ACE inhibitor which may preclude him from being put on an ARB.  We may need to start him on a centrally acting calcium channel blocker like verapamil and transition him off metoprolol if he does have proteinuria

## 2022-07-18 NOTE — Assessment & Plan Note (Signed)
-   Patient had his flu shot at [facility]

## 2022-07-19 LAB — MICROALBUMIN / CREATININE URINE RATIO
Creatinine, Urine: 57.3 mg/dL
Microalb/Creat Ratio: 28 mg/g creat (ref 0–29)
Microalbumin, Urine: 15.8 ug/mL

## 2022-08-08 ENCOUNTER — Telehealth: Payer: Self-pay | Admitting: Dietician

## 2022-08-21 NOTE — Telephone Encounter (Signed)
Offered a follow up appointment to assist with Medical Nutrition Therapy changes to lower her brother's a1c. Mr Shiraishi's sister says they are too busy right now for another appointment.  Explained that their insurance covers diabetes and nutrition annually and to follow up as needed when ready.

## 2022-09-06 NOTE — Pre-Procedure Instructions (Signed)
Surgical Instructions    Your procedure is scheduled on Tuesday, September 19, 2022.  Report to [facility] Main Entrance "A" at 5:30 A.M., then check in with the Admitting office.  Call this number if you have problems the morning of surgery:  [phone]   If you have any questions prior to your surgery date call [phone]: Open Monday-Friday 8am-4pm If you experience any cold or flu symptoms such as cough, fever, chills, shortness of breath, etc. between now and your scheduled surgery, please notify us at the above number     Remember:  Do not eat after midnight the night before your surgery  You may drink clear liquids until 4:30 am the morning of your surgery.   Clear liquids allowed are: Water, Non-Citrus Juices (without pulp), Carbonated Beverages, Clear Tea, Black Coffee ONLY (NO MILK, CREAM OR POWDERED CREAMER of any kind), and Gatorade   Enhanced Recovery after Surgery  Enhanced Recovery after Surgery is a protocol used to improve the stress on your body and your recovery after surgery.  Patient Instructions   The day of surgery (if you have diabetes):  Drink ONE small 12 ounce bottle of Gatorade 2 by  4:30 am the morning of surgery This bottle was given to you during your hospital  pre-op appointment visit.  Nothing else to drink after completing the  Small bottle of Gatorade 2.         If you have questions, please contact your surgeon's office.     Take these medicines the morning of surgery with A SIP OF WATER:   metoprolol succinate (TOPROL-XL)  cetirizine (ZYRTEC ALLERGY)  pantoprazole (PROTONIX)  atorvastatin (LIPITOR)   As of today, STOP taking any Aspirin (unless otherwise instructed by your surgeon) Aleve, Naproxen, Ibuprofen, Motrin, Advil, Goody's, BC's, all herbal medications, fish oil, and all vitamins.   WHAT DO I DO ABOUT MY DIABETES MEDICATION?  Do not take oral diabetes medicines (pills) the morning of surgery.   HOW TO MANAGE YOUR  DIABETES BEFORE AND AFTER SURGERY  Why is it important to control my blood sugar before and after surgery? Improving blood sugar levels before and after surgery helps healing and can limit problems. A way of improving blood sugar control is eating a healthy diet by:  Eating less sugar and carbohydrates  Increasing activity/exercise  Talking with your doctor about reaching your blood sugar goals High blood sugars (greater than 180 mg/dL) can raise your risk of infections and slow your recovery, so you will need to focus on controlling your diabetes during the weeks before surgery. Make sure that the doctor who takes care of your diabetes knows about your planned surgery including the date and location.  How do I manage my blood sugar before surgery? Check your blood sugar at least 4 times a day, starting 2 days before surgery, to make sure that the level is not too high or low.  Check your blood sugar the morning of your surgery when you wake up and every 2 hours until you get to the Short Stay unit.  If your blood sugar is less than 70 mg/dL, you will need to treat for low blood sugar: Do not take insulin. Treat a low blood sugar (less than 70 mg/dL) with  cup of clear juice (cranberry or apple), 4 glucose tablets, OR glucose gel. Recheck blood sugar in 15 minutes after treatment (to make sure it is greater than 70 mg/dL). If your blood sugar is not greater than 70 mg/dL  on recheck, call [phone] for further instructions. Report your blood sugar to the short stay nurse when you get to Short Stay.  If you are admitted to the hospital after surgery: Your blood sugar will be checked by the staff and you will probably be given insulin after surgery (instead of oral diabetes medicines) to make sure you have good blood sugar levels. The goal for blood sugar control after surgery is 80-180 mg/dL.           Do not wear jewelry or makeup. Do not wear lotions, powders, perfumes/cologne or  deodorant. Do not shave 48 hours prior to surgery.  Men may shave face and neck. Do not bring valuables to the hospital. Do not wear nail polish, gel polish, artificial nails, or any other type of covering on natural nails (fingers and toes) If you have artificial nails or gel coating that need to be removed by a nail salon, please have this removed prior to surgery. Artificial nails or gel coating may interfere with anesthesia's ability to adequately monitor your vital signs.  [facility] is not responsible for any belongings or valuables.    Do NOT Smoke (Tobacco/Vaping)  24 hours prior to your procedure  If you use a CPAP at night, you may bring your mask for your overnight stay.   Contacts, glasses, hearing aids, dentures or partials may not be worn into surgery, please bring cases for these belongings   For patients admitted to the hospital, discharge time will be determined by your treatment team.   Patients discharged the day of surgery will not be allowed to drive home, and someone needs to stay with them for 24 hours.   SURGICAL WAITING ROOM VISITATION Patients having surgery or a procedure may have no more than 2 support people in the waiting area - these visitors may rotate.   Children under the age of 42 must have an adult with them who is not the patient. If the patient needs to stay at the hospital during part of their recovery, the visitor guidelines for inpatient rooms apply. Pre-op nurse will coordinate an appropriate time for 1 support person to accompany patient in pre-op.  This support person may not rotate.   Please refer to [email or web address] for the visitor guidelines for Inpatients (after your surgery is over and you are in a regular room).    Special instructions:    Oral Hygiene is also important to reduce your risk of infection.  Remember - BRUSH YOUR TEETH THE MORNING OF SURGERY WITH YOUR REGULAR  TOOTHPASTE   [facility]- Preparing For Surgery  Before surgery, you can play an important role. Because skin is not sterile, your skin needs to be as free of germs as possible. You can reduce the number of germs on your skin by washing with CHG (chlorahexidine gluconate) Soap before surgery.  CHG is an antiseptic cleaner which kills germs and bonds with the skin to continue killing germs even after washing.     Please do not use if you have an allergy to CHG or antibacterial soaps. If your skin becomes reddened/irritated stop using the CHG.  Do not shave (including legs and underarms) for at least 48 hours prior to first CHG shower. It is OK to shave your face.  Please follow these instructions carefully.     Shower the NIGHT BEFORE SURGERY and the MORNING OF SURGERY with CHG Soap.   If you chose to wash your hair, wash your hair first as usual  with your normal shampoo. After you shampoo, rinse your hair and body thoroughly to remove the shampoo.  Then [employer] and genitals (private parts) with your normal soap and rinse thoroughly to remove soap.  After that Use CHG Soap as you would any other liquid soap. You can apply CHG directly to the skin and wash gently with a scrungie or a clean washcloth.   Apply the CHG Soap to your body ONLY FROM THE NECK DOWN.  Do not use on open wounds or open sores. Avoid contact with your eyes, ears, mouth and genitals (private parts). Wash Face and genitals (private parts)  with your normal soap.   Wash thoroughly, paying special attention to the area where your surgery will be performed.  Thoroughly rinse your body with warm water from the neck down.  DO NOT shower/wash with your normal soap after using and rinsing off the CHG Soap.  Pat yourself dry with a CLEAN TOWEL.  Wear CLEAN PAJAMAS to bed the night before surgery  Place CLEAN SHEETS on your bed the night before your surgery  DO NOT SLEEP WITH PETS.   Day of Surgery:  Take a shower  with CHG soap. Wear Clean/Comfortable clothing the morning of surgery Do not apply any deodorants/lotions.   Remember to brush your teeth WITH YOUR REGULAR TOOTHPASTE.    If you received a COVID test during your pre-op visit, it is requested that you wear a mask when out in public, stay away from anyone that may not be feeling well, and notify your surgeon if you develop symptoms. If you have been in contact with anyone that has tested positive in the last 10 days, please notify your surgeon.    Please read over the following fact sheets that you were given.

## 2022-09-07 ENCOUNTER — Encounter (HOSPITAL_COMMUNITY): Payer: Self-pay

## 2022-09-07 ENCOUNTER — Other Ambulatory Visit: Payer: Self-pay

## 2022-09-07 ENCOUNTER — Encounter (HOSPITAL_COMMUNITY)
Admission: RE | Admit: 2022-09-07 | Discharge: 2022-09-07 | Disposition: A | Discharge status: Home / Self Care | Payer: Medicare Other | Source: Ambulatory Visit | Attending: General Surgery | Admitting: General Surgery

## 2022-09-07 VITALS — BP 131/84 | HR 82 | Temp 97.4°F | Resp 18 | Ht 62.0 in | Wt 133.7 lb

## 2022-09-07 DIAGNOSIS — Z01812 Encounter for preprocedural laboratory examination: Secondary | ICD-10-CM | POA: Diagnosis not present

## 2022-09-07 DIAGNOSIS — E119 Type 2 diabetes mellitus without complications: Secondary | ICD-10-CM | POA: Insufficient documentation

## 2022-09-07 DIAGNOSIS — Z01818 Encounter for other preprocedural examination: Secondary | ICD-10-CM

## 2022-09-07 LAB — HEMOGLOBIN A1C
Hgb A1c MFr Bld: 6.9 % — ABNORMAL HIGH (ref 4.8–5.6)
Mean Plasma Glucose: 151.33 mg/dL

## 2022-09-07 LAB — CBC
HCT: 46.5 % (ref 39.0–52.0)
Hemoglobin: 15.6 g/dL (ref 13.0–17.0)
MCH: 28.5 pg (ref 26.0–34.0)
MCHC: 33.5 g/dL (ref 30.0–36.0)
MCV: 85 fL (ref 80.0–100.0)
Platelets: 162 10*3/uL (ref 150–400)
RBC: 5.47 MIL/uL (ref 4.22–5.81)
RDW: 12.7 % (ref 11.5–15.5)
WBC: 6.2 10*3/uL (ref 4.0–10.5)
nRBC: 0 % (ref 0.0–0.2)

## 2022-09-07 LAB — BASIC METABOLIC PANEL
Anion gap: 10 (ref 5–15)
BUN: 17 mg/dL (ref 6–20)
CO2: 26 mmol/L (ref 22–32)
Calcium: 9.7 mg/dL (ref 8.9–10.3)
Chloride: 100 mmol/L (ref 98–111)
Creatinine, Ser: 0.96 mg/dL (ref 0.61–1.24)
GFR, Estimated: >60 mL/min (ref >60)
Glucose, Bld: 164 mg/dL — ABNORMAL HIGH (ref 70–99)
Potassium: 4.1 mmol/L (ref 3.5–5.1)
Sodium: 136 mmol/L (ref 135–145)

## 2022-09-07 LAB — GLUCOSE, CAPILLARY: Glucose-Capillary: 172 mg/dL — ABNORMAL HIGH (ref 70–99)

## 2022-09-07 NOTE — Progress Notes (Signed)
PCP - Nischal Narendra Cardiologist - denies  PPM/ICD - denies    Chest x-ray - N/A EKG - 06/19/22 Stress Test - denies ECHO - denies Cardiac Cath - denies  Sleep Study - denies   Fasting Blood Sugar -Per sister, CBG typically 140-170 at home, sometimes up to 200 after he eats. A1c 7/8 in August. A1c re-drawn today. CBG 174 at PAT. Pt checks his blood sugar twice a week at home.    Last dose of GLP1 agonist-  denies   Blood Thinner Instructions:N/A Aspirin Instructions:7 days prior to surgery STOP taking any Aspirin (unless otherwise instructed by your surgeon), Aleve, Naproxen, Ibuprofen, Motrin, Advil, Goody's, BC's, all herbal medications, fish oil, and all vitamins.   ERAS Protcol - ERAS + G2   COVID TEST- N/A   Anesthesia review: no  Patient denies shortness of breath, fever, cough and chest pain at PAT appointment   All instructions explained to the patient and patient's sister, with a verbal understanding of the material. The opportunity to ask questions was provided.  Pt with high functioning MR per sister. Per sister and patient, patient makes his own legal decisions. Patient does not have a court appointed legal guardian.

## 2022-09-07 NOTE — Pre-Procedure Instructions (Signed)
Surgical Instructions    Your procedure is scheduled on Tuesday, September 19, 2022.  Report to [facility] Main Entrance "A" at 5:30 A.M., then check in with the Admitting office.  Call this number if you have problems the morning of surgery:  [phone]   If you have any questions prior to your surgery date call [phone]: Open Monday-Friday 8am-4pm If you experience any cold or flu symptoms such as cough, fever, chills, shortness of breath, etc. between now and your scheduled surgery, please notify us at the above number     Remember:  Do not eat after midnight the night before your surgery  You may drink clear liquids until 4:30 am the morning of your surgery.   Clear liquids allowed are: Water, Non-Citrus Juices (without pulp), Carbonated Beverages, Clear Tea, Black Coffee ONLY (NO MILK, CREAM OR POWDERED CREAMER of any kind), and Gatorade   Enhanced Recovery after Surgery  Enhanced Recovery after Surgery is a protocol used to improve the stress on your body and your recovery after surgery.  Patient Instructions   The day of surgery (if you have diabetes):  Drink ONE small 12 ounce bottle of Gatorade 2 by  4:30 am the morning of surgery This bottle was given to you during your hospital  pre-op appointment visit.  Nothing else to drink after completing the  Small bottle of Gatorade 2.         If you have questions, please contact your surgeon's office.     Take these medicines the morning of surgery with A SIP OF WATER:   metoprolol succinate (TOPROL-XL)  cetirizine (ZYRTEC ALLERGY)  pantoprazole (PROTONIX)  atorvastatin (LIPITOR)   As of today, STOP taking any Aspirin (unless otherwise instructed by your surgeon) Aleve, Naproxen, Ibuprofen, Motrin, Advil, Goody's, BC's, all herbal medications, fish oil, and all vitamins.   WHAT DO I DO ABOUT MY DIABETES MEDICATION?  Do not take oral diabetes medicines (pills) the morning of surgery. DO NOT TAKE JANUVIA,  dapagliflozin propanediol (FARXIGA), metFORMIN (GLUCOPHAGE) the morning of surgery. DO NOT TAKE  dapagliflozin propanediol (FARXIGA) for 72 hours prior to surgery. Last dose should be 09/15/22.    HOW TO MANAGE YOUR DIABETES BEFORE AND AFTER SURGERY  Why is it important to control my blood sugar before and after surgery? Improving blood sugar levels before and after surgery helps healing and can limit problems. A way of improving blood sugar control is eating a healthy diet by:  Eating less sugar and carbohydrates  Increasing activity/exercise  Talking with your doctor about reaching your blood sugar goals High blood sugars (greater than 180 mg/dL) can raise your risk of infections and slow your recovery, so you will need to focus on controlling your diabetes during the weeks before surgery. Make sure that the doctor who takes care of your diabetes knows about your planned surgery including the date and location.  How do I manage my blood sugar before surgery? Check your blood sugar at least 4 times a day, starting 2 days before surgery, to make sure that the level is not too high or low.  Check your blood sugar the morning of your surgery when you wake up and every 2 hours until you get to the Short Stay unit.  If your blood sugar is less than 70 mg/dL, you will need to treat for low blood sugar: Do not take insulin. Treat a low blood sugar (less than 70 mg/dL) with  cup of clear juice (cranberry or apple), 4  glucose tablets, OR glucose gel. Recheck blood sugar in 15 minutes after treatment (to make sure it is greater than 70 mg/dL). If your blood sugar is not greater than 70 mg/dL on recheck, call [phone] for further instructions. Report your blood sugar to the short stay nurse when you get to Short Stay.  If you are admitted to the hospital after surgery: Your blood sugar will be checked by the staff and you will probably be given insulin after surgery (instead of oral diabetes  medicines) to make sure you have good blood sugar levels. The goal for blood sugar control after surgery is 80-180 mg/dL.            [facility] is not responsible for any belongings or valuables.    Do NOT Smoke (Tobacco/Vaping)  24 hours prior to your procedure  If you use a CPAP at night, you may bring your mask for your overnight stay.   Contacts, glasses, hearing aids, dentures or partials may not be worn into surgery, please bring cases for these belongings   For patients admitted to the hospital, discharge time will be determined by your treatment team.   Patients discharged the day of surgery will not be allowed to drive home, and someone needs to stay with them for 24 hours.   SURGICAL WAITING ROOM VISITATION Patients having surgery or a procedure may have no more than 2 support people in the waiting area - these visitors may rotate.   Children under the age of 56 must have an adult with them who is not the patient. If the patient needs to stay at the hospital during part of their recovery, the visitor guidelines for inpatient rooms apply. Pre-op nurse will coordinate an appropriate time for 1 support person to accompany patient in pre-op.  This support person may not rotate.   Please refer to [email or web address] for the visitor guidelines for Inpatients (after your surgery is over and you are in a regular room).    Special instructions:    Oral Hygiene is also important to reduce your risk of infection.  Remember - BRUSH YOUR TEETH THE MORNING OF SURGERY WITH YOUR REGULAR TOOTHPASTE   [place]- Preparing For Surgery  Before surgery, you can play an important role. Because skin is not sterile, your skin needs to be as free of germs as possible. You can reduce the number of germs on your skin by washing with CHG (chlorahexidine gluconate) Soap before surgery.  CHG is an antiseptic cleaner which kills germs and bonds with  the skin to continue killing germs even after washing.     Please do not use if you have an allergy to CHG or antibacterial soaps. If your skin becomes reddened/irritated stop using the CHG.  Do not shave (including legs and underarms) for at least 48 hours prior to first CHG shower. It is OK to shave your face.  Please follow these instructions carefully.     Shower the NIGHT BEFORE SURGERY and the MORNING OF SURGERY with CHG Soap.   If you chose to wash your hair, wash your hair first as usual with your normal shampoo. After you shampoo, rinse your hair and body thoroughly to remove the shampoo.  Then [employer] and genitals (private parts) with your normal soap and rinse thoroughly to remove soap.  After that Use CHG Soap as you would any other liquid soap. You can apply CHG directly to the skin and wash gently with a scrungie or a clean  washcloth.   Apply the CHG Soap to your body ONLY FROM THE NECK DOWN.  Do not use on open wounds or open sores. Avoid contact with your eyes, ears, mouth and genitals (private parts). Wash Face and genitals (private parts)  with your normal soap.   Wash thoroughly, paying special attention to the area where your surgery will be performed.  Thoroughly rinse your body with warm water from the neck down.  DO NOT shower/wash with your normal soap after using and rinsing off the CHG Soap.  Pat yourself dry with a CLEAN TOWEL.  Wear CLEAN PAJAMAS to bed the night before surgery  Place CLEAN SHEETS on your bed the night before your surgery  DO NOT SLEEP WITH PETS.   Day of Surgery:  Take a shower with CHG soap. Wear Clean/Comfortable clothing the morning of surgery Do not wear jewelry or makeup. Do not wear lotions, powders, perfumes/cologne or deodorant. Do not shave 48 hours prior to surgery.  Men may shave face and neck. Do not bring valuables to the hospital. Do not wear nail polish, gel polish, artificial nails, or any other type of covering on  natural nails (fingers and toes) If you have artificial nails or gel coating that need to be removed by a nail salon, please have this removed prior to surgery. Artificial nails or gel coating may interfere with anesthesia's ability to adequately monitor your vital signs. Remember to brush your teeth WITH YOUR REGULAR TOOTHPASTE.    If you received a COVID test during your pre-op visit, it is requested that you wear a mask when out in public, stay away from anyone that may not be feeling well, and notify your surgeon if you develop symptoms. If you have been in contact with anyone that has tested positive in the last 10 days, please notify your surgeon.    Please read over the following fact sheets that you were given.

## 2022-09-19 ENCOUNTER — Ambulatory Visit (HOSPITAL_COMMUNITY)
Admission: RE | Admit: 2022-09-19 | Discharge: 2022-09-19 | Disposition: A | Discharge status: Home / Self Care | Payer: Medicare Other | Attending: General Surgery | Admitting: General Surgery

## 2022-09-19 ENCOUNTER — Encounter (HOSPITAL_COMMUNITY)
Admission: RE | Disposition: A | Discharge status: Home / Self Care | Payer: Self-pay | Source: Home / Self Care | Attending: General Surgery

## 2022-09-19 ENCOUNTER — Other Ambulatory Visit: Payer: Self-pay

## 2022-09-19 ENCOUNTER — Ambulatory Visit (HOSPITAL_BASED_OUTPATIENT_CLINIC_OR_DEPARTMENT_OTHER): Payer: Medicare Other | Admitting: Anesthesiology

## 2022-09-19 ENCOUNTER — Ambulatory Visit (HOSPITAL_COMMUNITY): Payer: Medicare Other | Admitting: Anesthesiology

## 2022-09-19 ENCOUNTER — Encounter (HOSPITAL_COMMUNITY): Payer: Self-pay | Admitting: General Surgery

## 2022-09-19 DIAGNOSIS — I10 Essential (primary) hypertension: Secondary | ICD-10-CM | POA: Diagnosis not present

## 2022-09-19 DIAGNOSIS — K4090 Unilateral inguinal hernia, without obstruction or gangrene, not specified as recurrent: Secondary | ICD-10-CM | POA: Insufficient documentation

## 2022-09-19 DIAGNOSIS — E119 Type 2 diabetes mellitus without complications: Secondary | ICD-10-CM | POA: Insufficient documentation

## 2022-09-19 DIAGNOSIS — K219 Gastro-esophageal reflux disease without esophagitis: Secondary | ICD-10-CM | POA: Diagnosis not present

## 2022-09-19 DIAGNOSIS — G8918 Other acute postprocedural pain: Secondary | ICD-10-CM | POA: Diagnosis not present

## 2022-09-19 HISTORY — PX: INSERTION OF MESH: SHX5868

## 2022-09-19 HISTORY — PX: INGUINAL HERNIA REPAIR: SHX194

## 2022-09-19 LAB — GLUCOSE, CAPILLARY
Glucose-Capillary: 133 mg/dL — ABNORMAL HIGH (ref 70–99)
Glucose-Capillary: 187 mg/dL — ABNORMAL HIGH (ref 70–99)

## 2022-09-19 SURGERY — REPAIR, HERNIA, INGUINAL, ADULT
Anesthesia: General | Site: Groin | Laterality: Left

## 2022-09-19 MED ORDER — ROCURONIUM BROMIDE 10 MG/ML (PF) SYRINGE
PREFILLED_SYRINGE | INTRAVENOUS | Status: DC | PRN
  Administered 2022-09-19: 60 mg via INTRAVENOUS

## 2022-09-19 MED ORDER — INSULIN ASPART 100 UNIT/ML IJ SOLN: 0.0000 [IU] | INTRAMUSCULAR | Status: DC | PRN

## 2022-09-19 MED ORDER — CHLORHEXIDINE GLUCONATE 0.12 % MT SOLN
15.0000 mL | Freq: Once | OROMUCOSAL | Status: AC
  Administered 2022-09-19: 15 mL via OROMUCOSAL
  Filled 2022-09-19: qty 15

## 2022-09-19 MED ORDER — FENTANYL CITRATE (PF) 250 MCG/5ML IJ SOLN
INTRAMUSCULAR | Status: DC | PRN
  Administered 2022-09-19 (×3): 50 ug via INTRAVENOUS

## 2022-09-19 MED ORDER — PROPOFOL 10 MG/ML IV BOLUS
INTRAVENOUS | Status: AC
  Filled 2022-09-19: qty 20

## 2022-09-19 MED ORDER — 0.9 % SODIUM CHLORIDE (POUR BTL) OPTIME
TOPICAL | Status: DC | PRN
  Administered 2022-09-19: 1000 mL

## 2022-09-19 MED ORDER — LACTATED RINGERS IV SOLN: INTRAVENOUS | Status: DC

## 2022-09-19 MED ORDER — DEXAMETHASONE SODIUM PHOSPHATE 10 MG/ML IJ SOLN
INTRAMUSCULAR | Status: DC | PRN
  Administered 2022-09-19: 10 mg via INTRAVENOUS

## 2022-09-19 MED ORDER — FENTANYL CITRATE (PF) 250 MCG/5ML IJ SOLN
INTRAMUSCULAR | Status: AC
  Filled 2022-09-19: qty 5

## 2022-09-19 MED ORDER — ACETAMINOPHEN 500 MG PO TABS
1000.0000 mg | ORAL_TABLET | ORAL | Status: AC
  Administered 2022-09-19: 1000 mg via ORAL
  Filled 2022-09-19: qty 2

## 2022-09-19 MED ORDER — CHLORHEXIDINE GLUCONATE CLOTH 2 % EX PADS: 6.0000 | MEDICATED_PAD | Freq: Once | CUTANEOUS | Status: DC

## 2022-09-19 MED ORDER — SUGAMMADEX SODIUM 200 MG/2ML IV SOLN
INTRAVENOUS | Status: DC | PRN
  Administered 2022-09-19: 200 mg via INTRAVENOUS

## 2022-09-19 MED ORDER — VANCOMYCIN HCL IN DEXTROSE 1-5 GM/200ML-% IV SOLN
1000.0000 mg | INTRAVENOUS | Status: AC
  Administered 2022-09-19: 1000 mg via INTRAVENOUS
  Filled 2022-09-19: qty 200

## 2022-09-19 MED ORDER — ORAL CARE MOUTH RINSE: 15.0000 mL | Freq: Once | OROMUCOSAL | Status: AC

## 2022-09-19 MED ORDER — OXYCODONE HCL 5 MG PO TABS: 5.0000 mg | ORAL_TABLET | Freq: Once | ORAL | Status: DC | PRN

## 2022-09-19 MED ORDER — FENTANYL CITRATE (PF) 100 MCG/2ML IJ SOLN: 25.0000 ug | INTRAMUSCULAR | Status: DC | PRN

## 2022-09-19 MED ORDER — ONDANSETRON HCL 4 MG/2ML IJ SOLN: 4.0000 mg | Freq: Four times a day (QID) | INTRAMUSCULAR | Status: DC | PRN

## 2022-09-19 MED ORDER — ROPIVACAINE HCL 5 MG/ML IJ SOLN
INTRAMUSCULAR | Status: DC | PRN
  Administered 2022-09-19: 30 mL via PERINEURAL

## 2022-09-19 MED ORDER — TRAMADOL HCL 50 MG PO TABS: 50.0000 mg | ORAL_TABLET | Freq: Four times a day (QID) | ORAL | 0 refills | Status: DC | PRN

## 2022-09-19 MED ORDER — BUPIVACAINE-EPINEPHRINE (PF) 0.25% -1:200000 IJ SOLN
INTRAMUSCULAR | Status: AC
  Filled 2022-09-19: qty 30

## 2022-09-19 MED ORDER — ONDANSETRON HCL 4 MG/2ML IJ SOLN
INTRAMUSCULAR | Status: DC | PRN
  Administered 2022-09-19: 4 mg via INTRAVENOUS

## 2022-09-19 MED ORDER — MIDAZOLAM HCL 2 MG/2ML IJ SOLN
INTRAMUSCULAR | Status: AC
  Filled 2022-09-19: qty 2

## 2022-09-19 MED ORDER — ENSURE PRE-SURGERY PO LIQD: 296.0000 mL | Freq: Once | ORAL | Status: DC

## 2022-09-19 MED ORDER — PROPOFOL 10 MG/ML IV BOLUS
INTRAVENOUS | Status: DC | PRN
  Administered 2022-09-19: 150 mg via INTRAVENOUS

## 2022-09-19 MED ORDER — MIDAZOLAM HCL 2 MG/2ML IJ SOLN
INTRAMUSCULAR | Status: DC | PRN
  Administered 2022-09-19 (×2): 1 mg via INTRAVENOUS

## 2022-09-19 MED ORDER — LIDOCAINE 2% (20 MG/ML) 5 ML SYRINGE
INTRAMUSCULAR | Status: DC | PRN
  Administered 2022-09-19: 40 mg via INTRAVENOUS

## 2022-09-19 MED ORDER — OXYCODONE HCL 5 MG/5ML PO SOLN: 5.0000 mg | Freq: Once | ORAL | Status: DC | PRN

## 2022-09-19 MED ORDER — BUPIVACAINE-EPINEPHRINE 0.25% -1:200000 IJ SOLN
INTRAMUSCULAR | Status: DC | PRN
  Administered 2022-09-19: 10 mL

## 2022-09-19 SURGICAL SUPPLY — 45 items
BAG COUNTER SPONGE SURGICOUNT (BAG) ×1 IMPLANT
BLADE CLIPPER SURG (BLADE) IMPLANT
CANISTER SUCT 3000ML PPV (MISCELLANEOUS) IMPLANT
CHLORAPREP W/TINT 26 (MISCELLANEOUS) ×1 IMPLANT
COVER SURGICAL LIGHT HANDLE (MISCELLANEOUS) ×1 IMPLANT
DERMABOND ADVANCED .7 DNX12 (GAUZE/BANDAGES/DRESSINGS) ×1 IMPLANT
DRAIN PENROSE 0.5X18 (DRAIN) IMPLANT
DRAIN PENROSE 1/2X12 LTX STRL (WOUND CARE) IMPLANT
DRAPE LAPAROSCOPIC ABDOMINAL (DRAPES) ×1 IMPLANT
ELECT REM PT RETURN 9FT ADLT (ELECTROSURGICAL) ×1
ELECTRODE REM PT RTRN 9FT ADLT (ELECTROSURGICAL) ×1 IMPLANT
GAUZE 4X4 16PLY ~~LOC~~+RFID DBL (SPONGE) ×1 IMPLANT
GLOVE BIO SURGEON STRL SZ7.5 (GLOVE) ×1 IMPLANT
GLOVE BIOGEL PI IND STRL 6.5 (GLOVE) IMPLANT
GLOVE BIOGEL PI IND STRL 7.0 (GLOVE) IMPLANT
GLOVE BIOGEL PI IND STRL 8 (GLOVE) ×1 IMPLANT
GOWN STRL REUS W/ TWL LRG LVL3 (GOWN DISPOSABLE) ×1 IMPLANT
GOWN STRL REUS W/ TWL XL LVL3 (GOWN DISPOSABLE) ×1 IMPLANT
GOWN STRL REUS W/TWL LRG LVL3 (GOWN DISPOSABLE)
GOWN STRL REUS W/TWL XL LVL3 (GOWN DISPOSABLE) ×2
KIT BASIN OR (CUSTOM PROCEDURE TRAY) ×1 IMPLANT
KIT TURNOVER KIT B (KITS) ×1 IMPLANT
MESH PARIETEX PROGRIP LEFT (Mesh General) IMPLANT
NDL HYPO 25GX1X1/2 BEV (NEEDLE) ×1 IMPLANT
NEEDLE HYPO 25GX1X1/2 BEV (NEEDLE) ×1 IMPLANT
NS IRRIG 1000ML POUR BTL (IV SOLUTION) ×1 IMPLANT
PACK GENERAL/GYN (CUSTOM PROCEDURE TRAY) ×1 IMPLANT
PAD ARMBOARD 7.5X6 YLW CONV (MISCELLANEOUS) ×2 IMPLANT
PENCIL SMOKE EVACUATOR (MISCELLANEOUS) ×1 IMPLANT
SPONGE INTESTINAL PEANUT (DISPOSABLE) IMPLANT
SUT MNCRL AB 4-0 PS2 18 (SUTURE) ×1 IMPLANT
SUT PROLENE 2 0 SH DA (SUTURE) ×1 IMPLANT
SUT SILK 0 TIES 10X30 (SUTURE) ×1 IMPLANT
SUT VIC AB 2-0 SH 27 (SUTURE) ×3
SUT VIC AB 2-0 SH 27X BRD (SUTURE) ×1 IMPLANT
SUT VIC AB 3-0 SH 27 (SUTURE) ×1
SUT VIC AB 3-0 SH 27XBRD (SUTURE) ×1 IMPLANT
SUT VICRYL AB 2 0 TIES (SUTURE) ×1 IMPLANT
SYR CONTROL 10ML LL (SYRINGE) ×1 IMPLANT
SYRINGE TOOMEY DISP (SYRINGE) ×1 IMPLANT
TOWEL GREEN STERILE (TOWEL DISPOSABLE) ×1 IMPLANT
TOWEL GREEN STERILE FF (TOWEL DISPOSABLE) ×1 IMPLANT
TRAY FOL W/BAG SLVR 16FR STRL (SET/KITS/TRAYS/PACK) ×1 IMPLANT
TRAY FOLEY W/BAG SLVR 16FR LF (SET/KITS/TRAYS/PACK)
WATER STERILE IRR 1000ML POUR (IV SOLUTION) IMPLANT

## 2022-09-19 NOTE — Anesthesia Procedure Notes (Signed)
Procedure Name: Intubation Date/Time: 09/19/2022 7:34 AM  Performed by: Griffin Dakin, CRNAPre-anesthesia Checklist: Patient identified, Emergency Drugs available, Suction available and Patient being monitored Patient Re-evaluated:Patient Re-evaluated prior to induction Oxygen Delivery Method: Circle system utilized Preoxygenation: Pre-oxygenation with 100% oxygen Induction Type: IV induction Ventilation: Mask ventilation without difficulty Laryngoscope Size: Mac and 4 Grade View: Grade I Tube type: Oral Tube size: 7.5 mm Number of attempts: 1 Airway Equipment and Method: Stylet Placement Confirmation: ETT inserted through vocal cords under direct vision, positive ETCO2 and breath sounds checked- equal and bilateral Secured at: 23 cm Tube secured with: Tape Dental Injury: Teeth and Oropharynx as per pre-operative assessment

## 2022-09-19 NOTE — H&P (Signed)
Chief Complaint: Post Operative Visit (Inguinal hernia)       History of Present Illness: Larry Wiley is a 56 y.o. male who is seen today as an office consultation at the request of Dr. Seymour Bars for evaluation of Post Operative Visit (Inguinal hernia) .   Patient is status post open right inguinal hernia repair with mesh.  This was 3 weeks ago.  Patient is doing well had minimal pain.   Patient states he feels a significant difference.  He does have some pain to the left side where he does still have a large hernia.   He is interested in continuing with repair of the left inguinal hernia.     Review of Systems: A complete review of systems was obtained from the patient.  I have reviewed this information and discussed as appropriate with the patient.  See HPI as well for other ROS.   Review of Systems  Constitutional:  Negative for fever.  HENT:  Negative for congestion.   Eyes:  Negative for blurred vision.  Respiratory:  Negative for cough, shortness of breath and wheezing.   Cardiovascular:  Negative for chest pain and palpitations.  Gastrointestinal:  Negative for heartburn.  Genitourinary:  Negative for dysuria.  Musculoskeletal:  Negative for myalgias.  Skin:  Negative for rash.  Neurological:  Negative for dizziness and headaches.  Psychiatric/Behavioral:  Negative for depression and suicidal ideas.   All other systems reviewed and are negative.      Medical History: Past Medical History Past Medical History: Diagnosis        Date            Diabetes mellitus without complication (CMS-HCC)               GERD (gastroesophageal reflux disease)              There is no problem list on file for this patient.     Past Surgical History Past Surgical History: Procedure       Laterality         Date            laser eye surgery                            Allergies Allergies Allergen           Reactions            Amoxicillin-Pot Clavulanate    Hives       Current  Outpatient Medications on File Prior to Visit Medication       Sig       Dispense         Refill            atorvastatin (LIPITOR) 40 MG tablet  Take 1 tablet by mouth once daily                               cetirizine (ZYRTEC) 10 mg capsule   Take 10 mg by mouth once daily                                FARXIGA 5 mg Tab tablet      TAKE 1 TABLET BY MOUTH DAILY BEFORE BREAKFAST.  hydroCHLOROthiazide (MICROZIDE) 12.5 mg capsule       Take 12.5 mg by mouth once daily                                 latanoprost (XALATAN) 0.005 % ophthalmic solution           INSTILL 1 DROP INTO BOTH EYES DAILY                              metFORMIN (GLUCOPHAGE) 1000 MG tablet        TAKE 1 TABLET (1,000 MG TOTAL) BY MOUTH 2 (TWO) TIMES DAILY WITH A MEAL.                                  metoprolol succinate (TOPROL-XL) 25 MG XL tablet           Take 12.5 mg by mouth once daily                                 metroNIDAZOLE (METROGEL) 0.75 % (37.5mg /5 gram) vaginal gel         Place 1 applicator vaginally 2 (two) times daily                                pantoprazole (PROTONIX) 20 MG DR tablet            Take 1 tablet by mouth once daily                             SITagliptin phosphate (JANUVIA) 100 MG tablet      Take 1 tablet by mouth once daily                    No current facility-administered medications on file prior to visit.     Family History Family History Problem           Relation           Age of Onset            Stroke  Mother              Obesity            Father               High blood pressure (Hypertension)   Father               Hyperlipidemia (Elevated cholesterol)            Father               Diabetes          Father               Myocardial Infarction (Heart attack)   Father               Diabetes          Sister                Hyperlipidemia (Elevated cholesterol)            Sister  High blood pressure (Hypertension)   Sister            Social History   Tobacco Use Smoking Status           Never Smokeless Tobacco   Never     Social History Social History     Socioeconomic History            Marital status:  Single Tobacco Use            Smoking status:          Never            Smokeless tobacco:    Never       Objective:    BP (!) 148/85   Pulse 81   Temp 97.8 F (36.6 C) (Oral)   Resp 18   Ht 5\' 2"  (1.575 m)   Wt 62.1 kg   SpO2 98%   BMI 25.06 kg/m   There were no vitals filed for this visit.  There is no height or weight on file to calculate BMI. Physical Exam Constitutional:      Appearance: Normal appearance.  HENT:     Head: Normocephalic and atraumatic.     Nose: Nose normal. No congestion.     Mouth/Throat:     Mouth: Mucous membranes are moist.     Pharynx: Oropharynx is clear.  Eyes:     Pupils: Pupils are equal, round, and reactive to light.  Cardiovascular:     Rate and Rhythm: Normal rate and regular rhythm.     Pulses: Normal pulses.     Heart sounds: Normal heart sounds. No murmur heard.   No friction rub. No gallop.  Pulmonary:     Effort: Pulmonary effort is normal. No respiratory distress.     Breath sounds: Normal breath sounds. No stridor. No wheezing, rhonchi or rales.  Abdominal:     General: Abdomen is flat.     Hernia: A hernia (Right incision well-healed, healing ridge.) is present. Hernia is present in the left inguinal area. There is no hernia in the right inguinal area.  Musculoskeletal:        General: Normal range of motion.     Cervical back: Normal range of motion.  Skin:    General: Skin is warm and dry.  Neurological:     General: No focal deficit present.     Mental Status: He is alert and oriented to person, place, and time.  Psychiatric:        Mood and Affect: Mood normal.        Thought Content: Thought content normal.        Assessment and Plan: Diagnoses and all orders for this visit:   Non-recurrent unilateral inguinal hernia without  obstruction or gangrene   S/P hernia repair     Larry Wiley is a 56 y.o. male    1.          We will proceed to the OR for a open left inguinal hernia repair with mesh. 2.         All risks and benefits were discussed with the patient, to generally include infection, bleeding, damage to surrounding structures, acute and chronic nerve pain, and recurrence. Alternatives were offered and described.  All questions were answered and the patient voiced understanding of the procedure and wishes to proceed at this point.             No follow-ups on file.  Axel Filler, MD, [facility], [facility]

## 2022-09-19 NOTE — Anesthesia Procedure Notes (Signed)
Anesthesia Regional Block: TAP block   Pre-Anesthetic Checklist: , timeout performed,  Correct Patient, Correct Site, Correct Laterality,  Correct Procedure, Correct Position, site marked,  Risks and benefits discussed,  Surgical consent,  Pre-op evaluation,  At surgeon's request and post-op pain management  Laterality: Left  Prep: chloraprep       Needles:  Injection technique: Single-shot  Needle Type: Echogenic Needle     Needle Length: 9cm  Needle Gauge: 21     Additional Needles:   Narrative:  Start time: 09/19/2022 7:00 AM End time: 09/19/2022 7:12 AM Injection made incrementally with aspirations every 5 mL.  Performed by: Personally  Anesthesiologist: Achille Rich, MD  Additional Notes: Pt tolerated the procedure well.

## 2022-09-19 NOTE — Discharge Instructions (Signed)
[facility] _______[facility] ? ?INGUINAL HERNIA REPAIR: POST OP INSTRUCTIONS ? ?Always review your discharge instruction sheet given to you by the facility where your surgery was performed. ?IF YOU HAVE DISABILITY OR FAMILY LEAVE FORMS, YOU MUST BRING THEM TO THE OFFICE FOR PROCESSING.   ?DO NOT GIVE THEM TO YOUR DOCTOR. ? ?1. A  prescription for pain medication may be given to you upon discharge.  Take your pain medication as prescribed, if needed.  If narcotic pain medicine is not needed, then you may take acetaminophen (Tylenol) or ibuprofen (Advil) as needed. ?2. Take your usually prescribed medications unless otherwise directed. ?If you need a refill on your pain medication, please contact your pharmacy.  They will contact our office to request authorization. Prescriptions will not be filled after 5 pm or on week-ends. ?3. You should follow a light diet the first 24 hours after arrival home, such as soup and crackers, etc.  Be sure to include lots of fluids daily.  Resume your normal diet the day after surgery. ?4.Most patients will experience some swelling and bruising around the umbilicus or in the groin and scrotum.  Ice packs and reclining will help.  Swelling and bruising can take several days to resolve.  ?6. It is common to experience some constipation if taking pain medication after surgery.  Increasing fluid intake and taking a stool softener (such as Colace) will usually help or prevent this problem from occurring.  A mild laxative (Milk of Magnesia or Miralax) should be taken according to package directions if there are no bowel movements after 48 hours. ?7. Unless discharge instructions indicate otherwise, you may remove your bandages 24-48 hours after surgery, and you may shower at that time.  You may have steri-strips (small skin tapes) in place directly over the incision.  These strips should be left on the skin for 7-10 days.  If your surgeon used skin glue on the incision, you may  shower in 24 hours.  The glue will flake off over the next 2-3 weeks.  Any sutures or staples will be removed at the office during your follow-up visit. ?8. ACTIVITIES:  You may resume regular (light) daily activities beginning the next day--such as daily self-care, walking, climbing stairs--gradually increasing activities as tolerated.  You may have sexual intercourse when it is comfortable.  Refrain from any heavy lifting or straining until approved by your doctor. ? ?a.You may drive when you are no longer taking prescription pain medication, you can comfortably wear a seatbelt, and you can safely maneuver your car and apply brakes. ?b.RETURN TO WORK:   ?_____________________________________________ ? ?9.You should see your doctor in the office for a follow-up appointment approximately 2-3 weeks after your surgery.  Make sure that you call for this appointment within a day or two after you arrive home to insure a convenient appointment time. ?10.OTHER INSTRUCTIONS: _________________________ ?   _____________________________________ ? ?WHEN TO CALL YOUR DOCTOR: ?Fever over 101.0 ?Inability to urinate ?Nausea and/or vomiting ?Extreme swelling or bruising ?Continued bleeding from incision. ?Increased pain, redness, or drainage from the incision ? ?The clinic staff is available to answer your questions during regular business hours.  Please don?t hesitate to call and ask to speak to one of the nurses for clinical concerns.  If you have a medical emergency, go to the nearest emergency room or call 911.  A surgeon from [facility] is always on call at the hospital ? ? ?[address], [address], , [place]    [address] ? ? [address] [address], [place], [place]   [address] ?[phone] ? [phone] ? FAX [phone] ?Web site: [email or web address] ? ?

## 2022-09-19 NOTE — Op Note (Signed)
09/19/2022  8:26 AM  PATIENT:  Larry Wiley  56 y.o. male  PRE-OPERATIVE DIAGNOSIS:  LEFT INGUINAL HERNIA  POST-OPERATIVE DIAGNOSIS:  LEFT DIRECT INGUINAL HERNIA  PROCEDURE:  Procedure(s): OPEN LEFT INGUINAL HERNIA REPAIR WITH MESH (Left) INSERTION OF MESH (Left)  SURGEON:  Surgeon(s) and Role:    Axel Filler, MD - Primary  ANESTHESIA:   local and general  EBL:  minimal   BLOOD ADMINISTERED:none  DRAINS: none   LOCAL MEDICATIONS USED:  BUPIVICAINE   SPECIMEN:  No Specimen  DISPOSITION OF SPECIMEN:  N/A  COUNTS:  YES  TOURNIQUET:  * No tourniquets in log *  DICTATION: .Dragon Dictation  Findings: Patient with large direct hernia.  This was imbricated.  Patient had attenuated external oblique.  The portion of the mesh was placed underneath the external oblique muscle.  The inferior portion of the mesh was secured to the shelving edge of the external oblique muscle.   Details of the procedure: The patient was taken back to the operating room. The patient was placed in supine position with bilateral SCDs in place. The patient was prepped and draped in the usual sterile fashion.  After appropriate anitbiotics were confirmed, a time-out was confirmed and all facts were verified.  Quarter percent Marcaine was used to infiltrate the area of the incision and an ilioinguinal nerve block was also placed.   A 5 cm incision was made just 1 cm superior to the inguinal ligament. Bovie cautery was used to maintain hemostasis dissection is carried down to the external oblique.  The external bleak muscle was very attenuated.    The spermatic cord and the hernia were then bluntly dissected away from the pubic tubercle and a Penrose was placed around the hernia sac in the spermatic cord. The vas deferens was identified and protected at all portions of the case. Dissection of the cremasterics took place with Bovie cautery.  The hernia sac appeared to be in the direct space.  One the  hernia sac was dissected away from the surrrounding cremesteric tissue.  A 2-0 Vicryl was then used to imbricate the hernia to allow for placement of the mesh.   At this time a left-sided Progrip mesh was then anchored to the pubic tubercle with a 2-0 Prolene.  It was anchored to the shelving edge of the external oblique x 1 and the conjoint tendon cephalad x 1.  The wrap around of the mesh was sutured to the conjoint tendon as well.  The new internal ring did not strangulate the spermatic cord.   The tail was then tucked under the external oblique. At this time the area was irrigated out with sterile saline.  Secondary to the continuation of the external oblique muscle.  The inferior portion of the mesh was secured externally to the shelving edge.  Superiorly this was tucked underneath the external oblique muscle and sutured to the conjoint tendon.  Scarpa's fascia was then reapproximated using a 3-0 Vicryl running fashion. The skin was then reapproximated with 4 Monocryl in a subcuticular fashion. The skin was then dressed with Dermabond.  The patient was taken to the recovery room in stable condition.   PLAN OF CARE: Discharge to home after PACU  PATIENT DISPOSITION:  PACU - hemodynamically stable.   Delay start of Pharmacological VTE agent (>24hrs) due to surgical blood loss or risk of bleeding: not applicable

## 2022-09-19 NOTE — Transfer of Care (Signed)
Immediate Anesthesia Transfer of Care Note  Patient: Larry Wiley  Procedure(s) Performed: OPEN LEFT INGUINAL HERNIA REPAIR WITH MESH (Left: Groin) INSERTION OF MESH (Left: Groin)  Patient Location: PACU  Anesthesia Type:General  Level of Consciousness: awake, alert , and oriented  Airway & Oxygen Therapy: Patient Spontanous Breathing and Patient connected to face mask oxygen  Post-op Assessment: Report given to RN and Post -op Vital signs reviewed and stable  Post vital signs: Reviewed and stable  Last Vitals:  Vitals Value Taken Time  BP 156/99 09/19/22 0845  Temp    Pulse 83 09/19/22 0846  Resp 16 09/19/22 0846  SpO2 98 % 09/19/22 0846  Vitals shown include unvalidated device data.  Last Pain:  Vitals:   09/19/22 0625  TempSrc:   PainSc: 0-No pain         Complications: No notable events documented.

## 2022-09-19 NOTE — Anesthesia Preprocedure Evaluation (Signed)
Anesthesia Evaluation  Patient identified by MRN, date of birth, ID band Patient awake    Reviewed: Allergy & Precautions, H&P , NPO status , Patient's Chart, lab work & pertinent test results  Airway Mallampati: II   Neck ROM: full    Dental   Pulmonary neg pulmonary ROS   breath sounds clear to auscultation       Cardiovascular hypertension,  Rhythm:regular Rate:Normal     Neuro/Psych  Neuromuscular disease    GI/Hepatic ,GERD  ,,  Endo/Other  diabetes, Type 2    Renal/GU      Musculoskeletal   Abdominal   Peds  Hematology   Anesthesia Other Findings   Reproductive/Obstetrics                             Anesthesia Physical Anesthesia Plan  ASA: 2  Anesthesia Plan: General   Post-op Pain Management: Regional block*   Induction: Intravenous  PONV Risk Score and Plan: 2 and Ondansetron, Dexamethasone, Midazolam and Treatment may vary due to age or medical condition  Airway Management Planned: Oral ETT  Additional Equipment:   Intra-op Plan:   Post-operative Plan: Extubation in OR  Informed Consent: I have reviewed the patients History and Physical, chart, labs and discussed the procedure including the risks, benefits and alternatives for the proposed anesthesia with the patient or authorized representative who has indicated his/her understanding and acceptance.     Dental advisory given  Plan Discussed with: CRNA, Anesthesiologist and Surgeon  Anesthesia Plan Comments:        Anesthesia Quick Evaluation

## 2022-09-20 ENCOUNTER — Encounter (HOSPITAL_COMMUNITY): Payer: Self-pay | Admitting: General Surgery

## 2022-09-20 NOTE — Anesthesia Postprocedure Evaluation (Signed)
Anesthesia Post Note  Patient: Larry Wiley  Procedure(s) Performed: OPEN LEFT INGUINAL HERNIA REPAIR WITH MESH (Left: Groin) INSERTION OF MESH (Left: Groin)     Patient location during evaluation: PACU Anesthesia Type: General and Regional Level of consciousness: awake and alert Pain management: pain level controlled Vital Signs Assessment: post-procedure vital signs reviewed and stable Respiratory status: spontaneous breathing, nonlabored ventilation, respiratory function stable and patient connected to nasal cannula oxygen Cardiovascular status: blood pressure returned to baseline and stable Postop Assessment: no apparent nausea or vomiting Anesthetic complications: no   No notable events documented.  Last Vitals:  Vitals:   09/19/22 0930 09/19/22 0945  BP: (!) 169/95 (!) 169/95  Pulse: 81 83  Resp: 14 15  Temp:  36.5 C  SpO2: 97% 95%    Last Pain:  Vitals:   09/19/22 0945  TempSrc:   PainSc: 0-No pain                 [name] S

## 2022-09-25 ENCOUNTER — Other Ambulatory Visit: Payer: Self-pay | Admitting: Internal Medicine

## 2022-09-25 DIAGNOSIS — L718 Other rosacea: Secondary | ICD-10-CM

## 2022-09-25 NOTE — Telephone Encounter (Signed)
Next appt scheduled 10/25/22 with Dr Evie Lacks.

## 2022-10-17 ENCOUNTER — Other Ambulatory Visit: Payer: Self-pay | Admitting: Internal Medicine

## 2022-10-17 DIAGNOSIS — E1129 Type 2 diabetes mellitus with other diabetic kidney complication: Secondary | ICD-10-CM

## 2022-10-21 ENCOUNTER — Encounter: Payer: Self-pay | Admitting: *Deleted

## 2022-10-25 ENCOUNTER — Encounter: Payer: Self-pay | Admitting: Student

## 2022-10-25 ENCOUNTER — Ambulatory Visit (INDEPENDENT_AMBULATORY_CARE_PROVIDER_SITE_OTHER): Payer: Medicare Other | Admitting: Student

## 2022-10-25 VITALS — BP 130/81 | HR 85 | Temp 97.4°F | Ht 62.0 in | Wt 131.4 lb

## 2022-10-25 DIAGNOSIS — E7800 Pure hypercholesterolemia, unspecified: Secondary | ICD-10-CM

## 2022-10-25 DIAGNOSIS — K219 Gastro-esophageal reflux disease without esophagitis: Secondary | ICD-10-CM

## 2022-10-25 DIAGNOSIS — Z7984 Long term (current) use of oral hypoglycemic drugs: Secondary | ICD-10-CM | POA: Diagnosis not present

## 2022-10-25 DIAGNOSIS — I10 Essential (primary) hypertension: Secondary | ICD-10-CM | POA: Diagnosis not present

## 2022-10-25 DIAGNOSIS — R809 Proteinuria, unspecified: Secondary | ICD-10-CM

## 2022-10-25 DIAGNOSIS — K4020 Bilateral inguinal hernia, without obstruction or gangrene, not specified as recurrent: Secondary | ICD-10-CM

## 2022-10-25 DIAGNOSIS — E1129 Type 2 diabetes mellitus with other diabetic kidney complication: Secondary | ICD-10-CM | POA: Diagnosis not present

## 2022-10-25 NOTE — Assessment & Plan Note (Signed)
Continue atorvastatin 40 mg daily, repeat lipid panel next visit

## 2022-10-25 NOTE — Progress Notes (Signed)
CC: hypertension, hyperlipidemia, diabetes  HPI:  Larry Wiley is a 57 y.o. male living with a history stated below and presents today for follow up of his chronic medical conditions and post op hernia repair. Please see problem based assessment and plan for additional details.  Past Medical History:  Diagnosis Date   Acne rosacea, papular type 02/25/2016   Bilateral inguinal hernia    Essential hypertension 08/18/2006   Gastroesophageal reflux disease 09/27/2012   Intermittent symptoms, does not want therapy    Hyperlipidemia LDL goal < 100 05/03/2007   Mental retardation 08/18/2006   Open-angle glaucoma 07/25/2013   Overweight (BMI 25.0-29.9) 09/27/2012   Type 2 diabetes mellitus with proteinuria or microalbuminuria 08/18/2006    Current Outpatient Medications on File Prior to Visit  Medication Sig Dispense Refill   ACCU-CHEK FASTCLIX LANCETS MISC check blood sugar up to 1 time a day as instructed 102 each 5   atorvastatin (LIPITOR) 40 MG tablet TAKE 1 TABLET BY MOUTH EVERY DAY 90 tablet 3   Blood Glucose Monitoring Suppl (ACCU-CHEK GUIDE) w/Device KIT 1 each by Does not apply route daily. check blood sugar up to 1 time a day as instructed. 1 kit 1   cetirizine (ZYRTEC ALLERGY) 10 MG tablet Take 1 tablet by mouth once daily 30 tablet 5   FARXIGA 10 MG TABS tablet TAKE 1 TABLET BY MOUTH DAILY BEFORE BREAKFAST. 90 tablet 1   glucose blood (ACCU-CHEK GUIDE) test strip Check blood sugars daily 100 strip 5   hydrochlorothiazide (MICROZIDE) 12.5 MG capsule TAKE 1 CAPSULE BY MOUTH EVERY DAY 90 capsule 3   JANUVIA 100 MG tablet TAKE 1 TABLET BY MOUTH EVERY DAY 90 tablet 3   latanoprost (XALATAN) 0.005 % ophthalmic solution Place 1 drop into both eyes at bedtime.     metFORMIN (GLUCOPHAGE) 1000 MG tablet TAKE 1 TABLET (1,000 MG TOTAL) BY MOUTH 2 (TWO) TIMES DAILY WITH A MEAL. 180 tablet 3   metoprolol succinate (TOPROL-XL) 25 MG 24 hr tablet TAKE 1/2 TABLET BY MOUTH EVERY DAY 45  tablet 3   metroNIDAZOLE (METROCREAM) 0.75 % cream Apply 1 Application topically daily as needed (rosacea). 45 g 1   No current facility-administered medications on file prior to visit.    Family History  Problem Relation Age of Onset   Hyperlipidemia Mother    Hypertension Mother    Liver cancer Mother    Diabetes Father    Heart attack Father    Cancer Sister        liver   Cancer Brother        lung    Social History   Socioeconomic History   Marital status: Single    Spouse name: Not on file   Number of children: 0   Years of education: Not on file   Highest education level: Not on file  Occupational History   Not on file  Tobacco Use   Smoking status: Never   Smokeless tobacco: Never  Vaping Use   Vaping Use: Never used  Substance and Sexual Activity   Alcohol use: No    Alcohol/week: 0.0 standard drinks of alcohol   Drug use: No   Sexual activity: Never  Other Topics Concern   Not on file  Social History Narrative   Current Social History 10/31/2021        Patient lives with his sister in a home which is 1 story. There are 4 steps up to the entrance the patient uses with  a railing      Patient's method of transportation is via family member.      The highest level of education was some high school.      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      Identified important Relationships are "his family"      Pets : 0       Interests / Fun: "Sports, watch TV, car shows"       Current Stressors: "none"       Religious / Personal Beliefs: "none"       Social Determinants of Health   Financial Resource Strain: Not on file  Food Insecurity: Not on file  Transportation Needs: Not on file  Physical Activity: Not on file  Stress: Not on file  Social Connections: Not on file  Intimate Partner Violence: Not on file   Review of Systems: ROS negative except for what is noted on the assessment and plan.  Vitals:   10/25/22 0921  BP: 130/81   Pulse: 85  Temp: (!) 97.4 F (36.3 C)  TempSrc: Oral  SpO2: 99%  Weight: 131 lb 6.4 oz (59.6 kg)  Height: _0  (1.575 m)   Physical Exam: Constitutional: no acute distress HENT: normocephalic atraumatic Eyes: conjunctiva non-erythematous Neck: supple Cardiovascular: regular rate and rhythm, no m/r/g Pulmonary/Chest: normal work of breathing on room air MSK: normal bulk and tone. Well healed surgical incisions in groin area bilaterally, firmness in left groin. No erythema, tenderness.  Neurological: alert & oriented x 3, 5/5 strength in bilateral upper and lower extremities, normal gait Skin: warm and dry  Assessment & Plan:   Essential hypertension BP today of 130/81, well controlled on regimen of HCTZ 12.5 daily and metoprolol succinate 25 mg daily. Microalbumin/creatinine urine last visit was normal, if develops can consider ARB however has history of angioedema with ACE inhibitor.  -continue HCTZ 12.91m daily and toprolol xl 25 mg daily  Gastroesophageal reflux disease GERD has been well controlled on protonix 20 mg for a few years has never been titrated off. Will discontinue medication and see how patient does at follow up visit. Instructed to call sooner if symptoms return at that time can consider GI referral -discontinue PPI -follow up GERD symptoms at next visit  Type 2 diabetes mellitus with microalbuminuria, without long-term current use of insulin (HCC) A1c has improved from 7.8% to 6.9%. Congratulated him on this and encouraged continued exercise when cleared from his hernia repair surgery. Discussed healthy diet habits. Will continue current regimen.  -continue metformin 10085mBID, farxiga 10 mg daily, and januvia 100 mg daily -follow up A1c in 1-2 months  Bilateral inguinal hernia Recent left sided hernia repair that went well. He has one more week of limitations in physical activity. He has noticed firmness at the surgical site that has improved with warm  compresses. Discussed if this stops improving, worsens, or he begins having pain to call the surgeons office for follow up -continue weight lifting limitations for another week -follow up with surgeons as needed.   Hyperlipidemia Continue atorvastatin 40 mg daily, repeat lipid panel next visit  Patient discussed with Dr. NaCaffie DammeD.O. [facility], PGY-3 Phone: [phone] 10/25/2022 Time 5:02 PM

## 2022-10-25 NOTE — Assessment & Plan Note (Signed)
GERD has been well controlled on protonix 20 mg for a few years has never been titrated off. Will discontinue medication and see how patient does at follow up visit. Instructed to call sooner if symptoms return at that time can consider GI referral -discontinue PPI -follow up GERD symptoms at next visit

## 2022-10-25 NOTE — Assessment & Plan Note (Signed)
A1c has improved from 7.8% to 6.9%. Congratulated him on this and encouraged continued exercise when cleared from his hernia repair surgery. Discussed healthy diet habits. Will continue current regimen.  -continue metformin 1000mg  BID, farxiga 10 mg daily, and januvia 100 mg daily -follow up A1c in 1-2 months

## 2022-10-25 NOTE — Assessment & Plan Note (Signed)
Recent left sided hernia repair that went well. He has one more week of limitations in physical activity. He has noticed firmness at the surgical site that has improved with warm compresses. Discussed if this stops improving, worsens, or he begins having pain to call the surgeons office for follow up -continue weight lifting limitations for another week -follow up with surgeons as needed.

## 2022-10-25 NOTE — Assessment & Plan Note (Signed)
BP today of 130/81, well controlled on regimen of HCTZ 12.5 daily and metoprolol succinate 25 mg daily. Microalbumin/creatinine urine last visit was normal, if develops can consider ARB however has history of angioedema with ACE inhibitor.  -continue HCTZ 12.5mg  daily and toprolol xl 25 mg daily

## 2022-10-25 NOTE — Patient Instructions (Addendum)
Thank you, Larry Wiley for allowing [place] to provide your care today. Today we discussed .  Hernia repair I am glad your surgeries went well. Please continue to follow with the surgeons as needed. If the firmness stops getting better, please call to see your surgeon. Continue the warm compresses.   Diabetes Your A1c has improved, please continue to eat a healthy diet and exercise after your limitations from your surgery are over. We will repeat your A1c in one month.     High blood pressure Please continue your HCTZ and metoprolol.   High cholesterol Please continue to take your atorvastatin daily  Acid Reflux We will try to take you off of this medicine. If your symptoms of reflux return please call our clinic back and follow up with [place] in one month.   I have ordered the following labs for you:  Lab Orders  No laboratory test(s) ordered today     Referrals ordered today:   Referral Orders  No referral(s) requested today     I have ordered the following medication/changed the following medications:   Stop the following medications: Medications Discontinued During This Encounter  Medication Reason   traMADol (ULTRAM) 50 MG tablet    traMADol (ULTRAM) 50 MG tablet    pantoprazole (PROTONIX) 20 MG tablet      Start the following medications: No orders of the defined types were placed in this encounter.    Follow up: 2 months for A1c and diabetes check with PCP  Should you have any questions or concerns please call the internal medicine clinic at [phone].    Sanjuana Letters, D.O. [place]

## 2022-10-29 NOTE — Progress Notes (Signed)
Internal Medicine Clinic Attending  Case discussed with Dr. Katsadouros  At the time of the visit.  We reviewed the resident's history and exam and pertinent patient test results.  I agree with the assessment, diagnosis, and plan of care documented in the resident's note.  

## 2022-10-29 NOTE — Addendum Note (Signed)
Addended by: Aldine Contes on: 10/29/2022 03:34 PM   Modules accepted: Level of Service

## 2022-11-21 ENCOUNTER — Encounter (HOSPITAL_COMMUNITY): Payer: Self-pay | Admitting: General Surgery

## 2022-11-29 ENCOUNTER — Other Ambulatory Visit: Payer: Self-pay | Admitting: Internal Medicine

## 2022-12-05 ENCOUNTER — Encounter: Payer: Self-pay | Admitting: Internal Medicine

## 2022-12-05 NOTE — Progress Notes (Unsigned)
[facility]: Clinic Note  Subjective:  History of Present Illness: Larry Wiley is a 57 y.o. year old male who presents for routine follow up of his chronic medical conditions. He was Dr. Wilber Bihari patient, and I will be his new PCP. Last visit 10/25/22 with Dr. Raliegh Ip.   He is here with his sister today, wearing his [place] gear.  Their main update is that sadly their brother passed away from cancer and a stroke 2 weeks ago. Larry Wiley is coping pretty well, has strong family support.   They also received notice from their pharmacy that Dapagliflozin may not be covered be insurance in the future. They just filled the medicine, and it was covered.   Please refer to Assessment and Plan below for full details in Problem-Based Charting.   Past Medical History:  Patient Active Problem List   Diagnosis Date Noted   Grief reaction 12/06/2022   Carpal tunnel syndrome 12/21/2020   Asymmetrical right sensorineural hearing loss 10/31/2017   Acne rosacea, papular type 02/25/2016   Open-angle glaucoma 07/25/2013   Gastroesophageal reflux disease 09/27/2012   Overweight (BMI 25.0-29.9) 09/27/2012   Healthcare maintenance 09/27/2012   Hyperlipidemia 05/03/2007   Type 2 diabetes mellitus with microalbuminuria, without long-term current use of insulin ([place]) 08/18/2006   Intellectual disability 08/18/2006   Essential hypertension 08/18/2006      Medications:  Current Outpatient Medications:    ACCU-CHEK FASTCLIX LANCETS MISC, check blood sugar up to 1 time a day as instructed, Disp: 102 each, Rfl: 5   atorvastatin (LIPITOR) 40 MG tablet, TAKE 1 TABLET BY MOUTH EVERY DAY, Disp: 90 tablet, Rfl: 3   Blood Glucose Monitoring Suppl (ACCU-CHEK GUIDE) w/Device KIT, 1 each by Does not apply route daily. check blood sugar up to 1 time a day as instructed., Disp: 1 kit, Rfl: 1   cetirizine (ZYRTEC ALLERGY) 10 MG tablet, Take 1 tablet by mouth once daily, Disp: 30 tablet, Rfl: 5    dapagliflozin propanediol (FARXIGA) 10 MG TABS tablet, Take 1 tablet (10 mg total) by mouth daily before breakfast., Disp: 90 tablet, Rfl: 3   glucose blood (ACCU-CHEK GUIDE) test strip, Check blood sugars daily, Disp: 100 strip, Rfl: 5   hydrochlorothiazide (MICROZIDE) 12.5 MG capsule, TAKE 1 CAPSULE BY MOUTH EVERY DAY, Disp: 90 capsule, Rfl: 3   JANUVIA 100 MG tablet, TAKE 1 TABLET BY MOUTH EVERY DAY, Disp: 90 tablet, Rfl: 3   latanoprost (XALATAN) 0.005 % ophthalmic solution, Place 1 drop into both eyes at bedtime., Disp: , Rfl:    metFORMIN (GLUCOPHAGE) 1000 MG tablet, TAKE 1 TABLET (1,000 MG TOTAL) BY MOUTH 2 (TWO) TIMES DAILY WITH A MEAL., Disp: 180 tablet, Rfl: 3   metoprolol succinate (TOPROL-XL) 25 MG 24 hr tablet, TAKE 1/2 TABLET BY MOUTH EVERY DAY, Disp: 45 tablet, Rfl: 3   metroNIDAZOLE (METROCREAM) 0.75 % cream, Apply 1 Application topically daily as needed (rosacea)., Disp: 45 g, Rfl: 1   Allergies: Allergies  Allergen Reactions   Ace Inhibitors Other (See Comments)     angiodema   Amoxicillin Hives    Has patient had a PCN reaction causing immediate rash, facial/tongue/throat swelling, SOB or lightheadedness with hypotension: Yes Has patient had a PCN reaction causing severe rash involving mucus membranes or skin necrosis: No Has patient had a PCN reaction that required hospitalization No Has patient had a PCN reaction occurring within the last 10 years: Yes If all of the above answers are "NO", then may proceed  with Cephalosporin use.   Amoxicillin-Pot Clavulanate Hives    Augmentin    Cat Hair Extract Swelling   Dust Mite Extract Swelling     Objective:   Vitals: Vitals:   12/06/22 0913  BP: 129/79  Pulse: 82  Temp: 97.9 F (36.6 C)  SpO2: 100%     Physical Exam: Physical Exam Constitutional:      Appearance: Normal appearance.  HENT:     Head:     Comments: + erythema over nasal bridge and L>R cheecks Cardiovascular:     Rate and Rhythm: Normal rate and  regular rhythm.     Heart sounds: No murmur heard. Pulmonary:     Effort: Pulmonary effort is normal.     Breath sounds: Normal breath sounds.  Neurological:     Mental Status: He is alert.      Data: Labs, imaging, and micro were reviewed in Epic. Refer to Assessment and Plan below for full details in Problem-Based Charting.  Assessment & Plan:  Grief reaction - I provided Kaiyden with resources for [facility] bereavement counseling and BHT at our clinic with Wyatt Portela if he develops a need in the future  Essential hypertension - Well controlled - continue HCTZ 12.67m daily, Metoprolol XL 241mdaily   Gastroesophageal reflux disease - Dr K Raliegh Iptopped his PPI last time, and JoLetrelleels great off it, no symptoms of GERD today - I will resolve this problem at next visit if symptoms don't recur - No PPI or further workup needed at this time  Type 2 diabetes mellitus with microalbuminuria, without long-term current use of insulin (HCC) - A1C 6.9 in 08/2022 - Continue Metformin 100034mID, Farxiga 44m40mily, Januvia 100mg3mly - He is due for his eye exam, which he has scheduled with Dr. GroteCarolynn Sayerswill talk to our pharmacist to see if we need to swap out SGLT2s for insurance coverage  Acne rosacea, papular type - continue Metronidazole gel as needed       Patient will follow up in 4 months, sooner if needed  JulieLottie Mussel

## 2022-12-06 ENCOUNTER — Encounter: Payer: Self-pay | Admitting: Internal Medicine

## 2022-12-06 ENCOUNTER — Ambulatory Visit (INDEPENDENT_AMBULATORY_CARE_PROVIDER_SITE_OTHER): Payer: Medicare Other | Admitting: Internal Medicine

## 2022-12-06 ENCOUNTER — Ambulatory Visit (INDEPENDENT_AMBULATORY_CARE_PROVIDER_SITE_OTHER): Payer: Medicare Other

## 2022-12-06 VITALS — BP 129/79 | HR 82 | Temp 97.9°F | Ht 62.0 in | Wt 133.8 lb

## 2022-12-06 DIAGNOSIS — F4320 Adjustment disorder, unspecified: Secondary | ICD-10-CM | POA: Insufficient documentation

## 2022-12-06 DIAGNOSIS — I10 Essential (primary) hypertension: Secondary | ICD-10-CM

## 2022-12-06 DIAGNOSIS — R809 Proteinuria, unspecified: Secondary | ICD-10-CM | POA: Diagnosis not present

## 2022-12-06 DIAGNOSIS — F4321 Adjustment disorder with depressed mood: Secondary | ICD-10-CM | POA: Insufficient documentation

## 2022-12-06 DIAGNOSIS — Z0000 Encounter for general adult medical examination without abnormal findings: Secondary | ICD-10-CM

## 2022-12-06 DIAGNOSIS — E1129 Type 2 diabetes mellitus with other diabetic kidney complication: Secondary | ICD-10-CM | POA: Diagnosis not present

## 2022-12-06 DIAGNOSIS — Z7984 Long term (current) use of oral hypoglycemic drugs: Secondary | ICD-10-CM | POA: Diagnosis not present

## 2022-12-06 DIAGNOSIS — K219 Gastro-esophageal reflux disease without esophagitis: Secondary | ICD-10-CM | POA: Diagnosis not present

## 2022-12-06 DIAGNOSIS — L718 Other rosacea: Secondary | ICD-10-CM

## 2022-12-06 NOTE — Assessment & Plan Note (Signed)
-   I provided Martice with resources for [employer] bereavement counseling and BHT at our clinic with Wyatt Portela if he develops a need in the future

## 2022-12-06 NOTE — Assessment & Plan Note (Signed)
-   Well controlled - continue HCTZ 12.5mg  daily, Metoprolol XL 25mg  daily

## 2022-12-06 NOTE — Assessment & Plan Note (Signed)
-   continue Metronidazole gel as needed

## 2022-12-06 NOTE — Assessment & Plan Note (Signed)
-   A1C 6.9 in 08/2022 - Continue Metformin 1000mg  BID, Farxiga 10mg  daily, Januvia 100mg  daily - He is due for his eye exam, which he has scheduled with Dr. Carolynn Sayers - I will talk to our pharmacist to see if we need to swap out SGLT2s for insurance coverage

## 2022-12-06 NOTE — Assessment & Plan Note (Signed)
-   Dr Raliegh Ip stopped his PPI last time, and Larry Wiley feels great off it, no symptoms of GERD today - I will resolve this problem at next visit if symptoms don't recur - No PPI or further workup needed at this time

## 2022-12-06 NOTE — Patient Instructions (Signed)
Dear Larry Wiley,  It was a pleasure meeting you today.  I am not making any changes to your medicines. I will talk to our pharmacist to make sure your [facility] continues to be covered.  I am so sorry about the loss of your brother. If you would like any counseling resources, here are 2 options:  1.) [facility] offers grief support counseling and support groups for anyone in this area who is grieving the loss of a loved one. Your loved one did not have to be served by [employer] to receive these services, and you do not need a referral. Their website is: https://www.[facility].org/grief-support and phone number is (539)377-3716.  2.) Our clinic offers Behavioral Health counseling and support. If you'd like to see our [place] provider, just call or message me, and I will place a referral.    I'll see you back in about 4 months.  Sincerely, Dr. Lottie Mussel

## 2022-12-06 NOTE — Progress Notes (Signed)
Subjective:   Larry Wiley is a 57 y.o. male who presents for Medicare Annual/Subsequent preventive examination. I connected with  Susann Givens on 12/06/22 by a  Face-To-Face  enabled telemedicine application and verified that I am speaking with the correct person using two identifiers.  Patient Location: Other:  Office/Clinic  Provider Location: Office/Clinic  I discussed the limitations of evaluation and management by telemedicine. The patient expressed understanding and agreed to proceed.  Review of Systems    Defer to PCP       Objective:    Today's Vitals   12/06/22 1105  BP: 129/79  Pulse: 82  Temp: 97.9 F (36.6 C)  TempSrc: Oral  SpO2: 100%  Weight: 133 lb 12.8 oz (60.7 kg)  Height: 5' 2"$  (1.575 m)   Body mass index is 24.47 kg/m.     12/06/2022   11:07 AM 12/06/2022    9:14 AM 10/25/2022    9:29 AM 09/07/2022    9:02 AM 07/18/2022    9:46 AM 06/22/2022    9:49 AM 06/19/2022    9:55 AM  Advanced Directives  Does Patient Have a Medical Advance Directive? No No No No No No No  Would patient like information on creating a medical advance directive? No - Patient declined No - Patient declined No - Patient declined No - Patient declined No - Patient declined No - Patient declined No - Patient declined    Current Medications (verified) Outpatient Encounter Medications as of 12/06/2022  Medication Sig   ACCU-CHEK FASTCLIX LANCETS MISC check blood sugar up to 1 time a day as instructed   atorvastatin (LIPITOR) 40 MG tablet TAKE 1 TABLET BY MOUTH EVERY DAY   Blood Glucose Monitoring Suppl (ACCU-CHEK GUIDE) w/Device KIT 1 each by Does not apply route daily. check blood sugar up to 1 time a day as instructed.   cetirizine (ZYRTEC ALLERGY) 10 MG tablet Take 1 tablet by mouth once daily   dapagliflozin propanediol (FARXIGA) 10 MG TABS tablet Take 1 tablet (10 mg total) by mouth daily before breakfast.   glucose blood (ACCU-CHEK GUIDE) test strip Check blood sugars daily    hydrochlorothiazide (MICROZIDE) 12.5 MG capsule TAKE 1 CAPSULE BY MOUTH EVERY DAY   JANUVIA 100 MG tablet TAKE 1 TABLET BY MOUTH EVERY DAY   latanoprost (XALATAN) 0.005 % ophthalmic solution Place 1 drop into both eyes at bedtime.   metFORMIN (GLUCOPHAGE) 1000 MG tablet TAKE 1 TABLET (1,000 MG TOTAL) BY MOUTH 2 (TWO) TIMES DAILY WITH A MEAL.   metoprolol succinate (TOPROL-XL) 25 MG 24 hr tablet TAKE 1/2 TABLET BY MOUTH EVERY DAY   metroNIDAZOLE (METROCREAM) 0.75 % cream Apply 1 Application topically daily as needed (rosacea).   No facility-administered encounter medications on file as of 12/06/2022.    Allergies (verified) Ace inhibitors, Amoxicillin, Amoxicillin-pot clavulanate, Cat hair extract, and Dust mite extract   History: Past Medical History:  Diagnosis Date   Acne rosacea, papular type 02/25/2016   Angioedema of lips 06/19/2018   Bilateral inguinal hernia    Bilateral inguinal hernia 12/13/2021   Essential hypertension 08/18/2006   Fatigue 08/02/2021   Gastroesophageal reflux disease 09/27/2012   Intermittent symptoms, does not want therapy    Healthcare maintenance 09/27/2012   Hyperlipidemia LDL goal < 100 05/03/2007   Mental retardation 08/18/2006   Open-angle glaucoma 07/25/2013   Overweight (BMI 25.0-29.9) 09/27/2012   Type 2 diabetes mellitus with proteinuria or microalbuminuria 08/18/2006   Past Surgical History:  Procedure Laterality  Date   EYE SURGERY  1970   lazy eye   INGUINAL HERNIA REPAIR Right 06/22/2022   Procedure: OPEN RIGHT INGUINAL HERNIA REPAIR WITH MESH;  Surgeon: Ralene Ok, MD;  Location: [facility];  Service: General;  Laterality: Right;   INGUINAL HERNIA REPAIR Left 09/19/2022   Procedure: OPEN LEFT INGUINAL HERNIA REPAIR WITH MESH;  Surgeon: Ralene Ok, MD;  Location: [place];  Service: General;  Laterality: Left;   INSERTION OF MESH Left 09/19/2022   Procedure: INSERTION OF MESH;  Surgeon: Ralene Ok, MD;  Location: [place];   Service: General;  Laterality: Left;   Family History  Problem Relation Age of Onset   Hyperlipidemia Mother    Hypertension Mother    Liver cancer Mother    Diabetes Father    Heart attack Father    Cancer Sister        liver   Cancer Brother        lung   Social History   Socioeconomic History   Marital status: Single    Spouse name: Not on file   Number of children: 0   Years of education: Not on file   Highest education level: Not on file  Occupational History   Not on file  Tobacco Use   Smoking status: Never   Smokeless tobacco: Never  Vaping Use   Vaping Use: Never used  Substance and Sexual Activity   Alcohol use: No    Alcohol/week: 0.0 standard drinks of alcohol   Drug use: No   Sexual activity: Never  Other Topics Concern   Not on file  Social History Narrative   Current Social History 10/31/2021        Patient lives with his sister in a home which is 1 story. There are 4 steps up to the entrance the patient uses with a railing      Patient's method of transportation is via family member.      The highest level of education was some high school.      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      Identified important Relationships are "his family"      Pets : 0       Interests / Fun: "Sports, watch TV, car shows"       Current Stressors: "none"       Religious / Personal Beliefs: "none"       Social Determinants of Health   Financial Resource Strain: Low Risk  (12/06/2022)   Overall Financial Resource Strain (CARDIA)    Difficulty of Paying Living Expenses: Not hard at all  Food Insecurity: No Food Insecurity (12/06/2022)   Hunger Vital Sign    Worried About Running Out of Food in the Last Year: Never true    [place] in the Last Year: Never true  Transportation Needs: No Transportation Needs (12/06/2022)   PRAPARE - Hydrologist (Medical): No    Lack of Transportation (Non-Medical): No   Physical Activity: Insufficiently Active (12/06/2022)   Exercise Vital Sign    Days of Exercise per Week: 7 days    Minutes of Exercise per Session: 10 min  Stress: No Stress Concern Present (12/06/2022)   [place]    Feeling of Stress : Not at all  Social Connections: Socially Isolated (12/06/2022)   Social Connection and Isolation Panel [NHANES]    Frequency of Communication with Friends  and Family: More than three times a week    Frequency of Social Gatherings with Friends and Family: More than three times a week    Attends Religious Services: Never    Marine scientist or Organizations: No    Attends Music therapist: Never    Marital Status: Never married    Tobacco Counseling Counseling given: Not Answered   Clinical Intake:  Pre-visit preparation completed: Yes  Pain : No/denies pain     Nutritional Risks: None Diabetes: Yes CBG done?: No Did pt. bring in CBG monitor from home?: No  How often do you need to have someone help you when you read instructions, pamphlets, or other written materials from your doctor or pharmacy?: 1 - Never What is the last grade level you completed in school?: 9th grade  Diabetic?Nutrition Risk Assessment:  Has the patient had any N/V/D within the last 2 months?  No  Does the patient have any non-healing wounds?  No  Has the patient had any unintentional weight loss or weight gain?  No   Diabetes:  Is the patient diabetic?  Yes  If diabetic, was a CBG obtained today?  Yes  Did the patient bring in their glucometer from home?  No  How often do you monitor your CBG's? Every 3 months.   Financial Strains and Diabetes Management:  Are you having any financial strains with the device, your supplies or your medication? No .  Does the patient want to be seen by Chronic Care Management for management of their diabetes?  No  Would the patient like to be  referred to a Nutritionist or for Diabetic Management?  No   Diabetic Exams:  Diabetic Eye Exam: Overdue for diabetic eye exam. Pt has been advised about the importance in completing this exam. Patient advised to call and schedule an eye exam. Diabetic Foot Exam: Completed 10/25/2022    Interpreter Needed?: No  Information entered by :: Tarnesha Ulloa,cma 12/06/22 11:06am   Activities of Daily Living    12/06/2022   11:07 AM 12/06/2022    9:15 AM  In your present state of health, do you have any difficulty performing the following activities:  Hearing? 0 0  Vision? 0 0  Difficulty concentrating or making decisions? 0 0  Walking or climbing stairs? 0 0  Dressing or bathing? 0 0  Doing errands, shopping? 0 0    Patient Care Team: Lottie Mussel, MD as PCP - General (Internal Medicine) Clent Jacks, MD (Ophthalmology) Alda Berthold, DO as Consulting Physician (Neurology)  Indicate any recent Medical Services you may have received from other than [facility] providers in the past year (date may be approximate).     Assessment:   This is a routine wellness examination for Larry Wiley.  Hearing/Vision screen No results found.  Dietary issues and exercise activities discussed:     Goals Addressed   None   Depression Screen    12/06/2022   11:07 AM 12/06/2022    9:15 AM 10/25/2022    9:29 AM 07/18/2022    9:45 AM 04/11/2022    8:57 AM 02/07/2022   11:58 AM 12/13/2021    9:31 AM  PHQ 2/9 Scores  PHQ - 2 Score 0 0 0 0 0 0 0    Fall Risk    12/06/2022   11:07 AM 12/06/2022    9:15 AM 10/25/2022    9:29 AM 07/18/2022    9:42 AM 04/11/2022    8:55 AM  Fall Risk   Falls in the past year? 0 0 0 0 0  Number falls in past yr: 0 0 0 0 0  Injury with Fall? 0 0 0 0 0  Risk for fall due to : No Fall Risks  No Fall Risks    Follow up Falls evaluation completed;Falls prevention discussed Falls evaluation completed Falls evaluation completed;Falls prevention discussed Falls evaluation completed  Falls evaluation completed    FALL RISK PREVENTION PERTAINING TO THE HOME:  Any stairs in or around the home?  Patient Refused If so, are there any without handrails?  Patient Refused Home free of loose throw rugs in walkways, pet beds, electrical cords, etc?  Patient Refused Adequate lighting in your home to reduce risk of falls?  Patient Refused  ASSISTIVE DEVICES UTILIZED TO PREVENT FALLS:  Life alert?  Patient Refused Use of a cane, walker or w/c?  Patient Refused Grab bars in the bathroom?  Patient Refused Shower chair or bench in shower?  Patient Refused Elevated toilet seat or a handicapped toilet?  Patient Refused  TIMED UP AND GO:  Was the test performed? Yes .  Length of time to ambulate 10 feet: 1 min.   Gait slow and steady without use of assistive device  Cognitive Function:        12/06/2022   11:07 AM 10/31/2021   10:05 AM  6CIT Screen  What Year? 0 points 0 points  What month? 0 points 0 points  What time? 0 points 0 points  Count back from 20 0 points 0 points  Months in reverse 0 points 0 points  Repeat phrase 0 points 0 points  Total Score 0 points 0 points    Immunizations Immunization History  Administered Date(s) Administered   Influenza Inj Mdck Quad Pf 07/10/2022   Influenza Split 12/25/2011, 09/27/2012   Influenza Whole 09/22/2005, 11/19/2006, 08/05/2007, 07/17/2008, 01/24/2010, 09/26/2010   Influenza,inj,Quad PF,6+ Mos 07/25/2013, 09/04/2014, 08/30/2015, 06/29/2016, 07/27/2017, 07/18/2018, 07/01/2019, 09/21/2020, 08/02/2021   PFIZER(Purple Top)SARS-COV-2 Vaccination 01/15/2020, 02/16/2020, 04/29/2021   Pfizer Covid-19 Vaccine Bivalent Booster 32yr & up 09/30/2021   Pneumococcal Conjugate-13 12/25/2014, 12/28/2016, 03/21/2018   Pneumococcal Polysaccharide-23 12/25/2011   Td 01/24/2010   Tdap 05/25/2020   Zoster Recombinat (Shingrix) 02/09/2022, 04/13/2022    TDAP status: Up to date  Flu Vaccine status: Up to date    Covid-19  vaccine status: Completed vaccines  Qualifies for Shingles Vaccine? No   Zostavax completed No   Shingrix Completed?: Yes  Screening Tests Health Maintenance  Topic Date Due   COVID-19 Vaccine (5 - 2023-24 season) 06/23/2022   OPHTHALMOLOGY EXAM  11/22/2022   COLON CANCER SCREENING ANNUAL FOBT  07/15/2023 (Originally 07/14/2021)   COLONOSCOPY (Pts 45-45yrInsurance coverage will need to be confirmed)  07/15/2023 (Originally 07/21/2009)   HEMOGLOBIN A1C  12/08/2022   Diabetic kidney evaluation - Urine ACR  07/19/2023   Diabetic kidney evaluation - eGFR measurement  09/08/2023   FOOT EXAM  10/26/2023   Medicare Annual Wellness (AWV)  12/07/2023   DTaP/Tdap/Td (3 - Td or Tdap) 05/25/2030   INFLUENZA VACCINE  Completed   Hepatitis C Screening  Completed   HIV Screening  Completed   Zoster Vaccines- Shingrix  Completed   HPV VACCINES  Aged Out    Health Maintenance  Health Maintenance Due  Topic Date Due   COVID-19 Vaccine (5 - 2023-24 season) 06/23/2022   OPHTHALMOLOGY EXAM  11/22/2022      Lung Cancer Screening: (Low Dose CT Chest recommended if  Age 26-80 years, 30 pack-year currently smoking OR have quit w/in 15years.) does not qualify.   Lung Cancer Screening Referral: N/A  Additional Screening:  Hepatitis C Screening: does not qualify; Completed 08/14/2015  Vision Screening: Recommended annual ophthalmology exams for early detection of glaucoma and other disorders of the eye. Is the patient up to date with their annual eye exam?  No  Who is the provider or what is the name of the office in which the patient attends annual eye exams? Dr.Groat If pt is not established with a provider, would they like to be referred to a provider to establish care? No .   Dental Screening: Recommended annual dental exams for proper oral hygiene  Community Resource Referral / Chronic Care Management: CRR required this visit?  No   CCM required this visit?  No      Plan:     I  have personally reviewed and noted the following in the patient's chart:   Medical and social history Use of alcohol, tobacco or illicit drugs  Current medications and supplements including opioid prescriptions. Patient is not currently taking opioid prescriptions. Functional ability and status Nutritional status Physical activity Advanced directives List of other physicians Hospitalizations, surgeries, and ER visits in previous 12 months Vitals Screenings to include cognitive, depression, and falls Referrals and appointments  In addition, I have reviewed and discussed with patient certain preventive protocols, quality metrics, and best practice recommendations. A written personalized care plan for preventive services as well as general preventive health recommendations were provided to patient.     Kerin Perna, [facility]   12/06/2022   Nurse Notes: Face-To-Face Visit  Larry Wiley , Thank you for taking time to come for your Medicare Wellness Visit. I appreciate your ongoing commitment to your health goals. Please review the following plan we discussed and let me know if I can assist you in the future.   These are the goals we discussed:  Goals      Blood Pressure < 140/90     Exercise 3x per week (30 min per time)     Increase walking     HEMOGLOBIN A1C < 7.0     Weight < 134 lb (60.8 kg)        This is a list of the screening recommended for you and due dates:  Health Maintenance  Topic Date Due   COVID-19 Vaccine (5 - 2023-24 season) 06/23/2022   Eye exam for diabetics  11/22/2022   Stool Blood Test  07/15/2023*   Colon Cancer Screening  07/15/2023*   Hemoglobin A1C  12/08/2022   Yearly kidney health urinalysis for diabetes  07/19/2023   Yearly kidney function blood test for diabetes  09/08/2023   Complete foot exam   10/26/2023   Medicare Annual Wellness Visit  12/07/2023   DTaP/Tdap/Td vaccine (3 - Td or Tdap) 05/25/2030   Flu Shot  Completed   Hepatitis C Screening:  USPSTF Recommendation to screen - Ages 18-79 yo.  Completed   HIV Screening  Completed   Zoster (Shingles) Vaccine  Completed   HPV Vaccine  Aged Out  *Topic was postponed. The date shown is not the original due date.

## 2023-01-02 ENCOUNTER — Other Ambulatory Visit: Payer: Self-pay | Admitting: Internal Medicine

## 2023-01-02 DIAGNOSIS — I10 Essential (primary) hypertension: Secondary | ICD-10-CM

## 2023-01-15 ENCOUNTER — Other Ambulatory Visit: Payer: Self-pay | Admitting: Internal Medicine

## 2023-01-15 DIAGNOSIS — K219 Gastro-esophageal reflux disease without esophagitis: Secondary | ICD-10-CM

## 2023-02-07 DIAGNOSIS — H0102B Squamous blepharitis left eye, upper and lower eyelids: Secondary | ICD-10-CM | POA: Diagnosis not present

## 2023-02-07 DIAGNOSIS — H5501 Congenital nystagmus: Secondary | ICD-10-CM | POA: Diagnosis not present

## 2023-02-07 DIAGNOSIS — H2513 Age-related nuclear cataract, bilateral: Secondary | ICD-10-CM | POA: Diagnosis not present

## 2023-02-07 DIAGNOSIS — H0102A Squamous blepharitis right eye, upper and lower eyelids: Secondary | ICD-10-CM | POA: Diagnosis not present

## 2023-02-07 DIAGNOSIS — E119 Type 2 diabetes mellitus without complications: Secondary | ICD-10-CM | POA: Diagnosis not present

## 2023-02-07 DIAGNOSIS — H40013 Open angle with borderline findings, low risk, bilateral: Secondary | ICD-10-CM | POA: Diagnosis not present

## 2023-02-07 LAB — HM DIABETES EYE EXAM

## 2023-02-14 ENCOUNTER — Other Ambulatory Visit: Payer: Self-pay | Admitting: Internal Medicine

## 2023-02-14 DIAGNOSIS — E1129 Type 2 diabetes mellitus with other diabetic kidney complication: Secondary | ICD-10-CM

## 2023-02-20 ENCOUNTER — Encounter: Payer: Self-pay | Admitting: Dietician

## 2023-04-02 NOTE — Progress Notes (Unsigned)
[facility]: Clinic Note  Subjective:  History of Present Illness: Larry Wiley is a 57 y.o. year old male who presents for routine follow up of his chronic medical conditions. I saw him last in Feb 2024, shortly after his brother passed away.   Grief?  # HTN - hctz 12.5mg  daily, metop xl 25mg  daily  # T2DM - A1c today - met 1g BID, farxiga 10, januvia 100 daily  - eye exam w grote  # acne rosacea - metrogel   # GERD - no sx  Q28mo fine if A1c ok   Please refer to Assessment and Plan below for full details in Problem-Based Charting.   Past Medical History:  Patient Active Problem List   Diagnosis Date Noted   Grief reaction 12/06/2022   Carpal tunnel syndrome 12/21/2020   Asymmetrical right sensorineural hearing loss 10/31/2017   Acne rosacea, papular type 02/25/2016   Open-angle glaucoma 07/25/2013   Gastroesophageal reflux disease 09/27/2012   Overweight (BMI 25.0-29.9) 09/27/2012   Healthcare maintenance 09/27/2012   Hyperlipidemia 05/03/2007   Type 2 diabetes mellitus with microalbuminuria, without long-term current use of insulin (HCC) 08/18/2006   Intellectual disability 08/18/2006   Essential hypertension 08/18/2006      Medications:  Current Outpatient Medications:    ACCU-CHEK FASTCLIX LANCETS MISC, check blood sugar up to 1 time a day as instructed, Disp: 102 each, Rfl: 5   atorvastatin (LIPITOR) 40 MG tablet, TAKE 1 TABLET BY MOUTH EVERY DAY, Disp: 90 tablet, Rfl: 3   Blood Glucose Monitoring Suppl (ACCU-CHEK GUIDE) w/Device KIT, 1 each by Does not apply route daily. check blood sugar up to 1 time a day as instructed., Disp: 1 kit, Rfl: 1   cetirizine (ZYRTEC ALLERGY) 10 MG tablet, Take 1 tablet by mouth once daily, Disp: 30 tablet, Rfl: 5   dapagliflozin propanediol (FARXIGA) 10 MG TABS tablet, Take 1 tablet (10 mg total) by mouth daily before breakfast., Disp: 90 tablet, Rfl: 3   glucose blood (ACCU-CHEK GUIDE) test strip, Check  blood sugars daily, Disp: 100 strip, Rfl: 5   hydrochlorothiazide (MICROZIDE) 12.5 MG capsule, TAKE 1 CAPSULE BY MOUTH EVERY DAY, Disp: 90 capsule, Rfl: 3   JANUVIA 100 MG tablet, TAKE 1 TABLET BY MOUTH EVERY DAY, Disp: 90 tablet, Rfl: 3   latanoprost (XALATAN) 0.005 % ophthalmic solution, Place 1 drop into both eyes at bedtime., Disp: , Rfl:    metFORMIN (GLUCOPHAGE) 1000 MG tablet, TAKE 1 TABLET (1,000 MG TOTAL) BY MOUTH TWICE A DAY WITH FOOD, Disp: 180 tablet, Rfl: 3   metoprolol succinate (TOPROL-XL) 25 MG 24 hr tablet, TAKE 1/2 TABLET BY MOUTH EVERY DAY, Disp: 45 tablet, Rfl: 3   metroNIDAZOLE (METROCREAM) 0.75 % cream, Apply 1 Application topically daily as needed (rosacea)., Disp: 45 g, Rfl: 1   Allergies: Allergies  Allergen Reactions   Ace Inhibitors Other (See Comments)     angiodema   Amoxicillin Hives    Has patient had a PCN reaction causing immediate rash, facial/tongue/throat swelling, SOB or lightheadedness with hypotension: Yes Has patient had a PCN reaction causing severe rash involving mucus membranes or skin necrosis: No Has patient had a PCN reaction that required hospitalization No Has patient had a PCN reaction occurring within the last 10 years: Yes If all of the above answers are "NO", then may proceed with Cephalosporin use.   Amoxicillin-Pot Clavulanate Hives    Augmentin    Cat Hair Extract Swelling   Dust Mite Extract  Swelling       Objective:   Vitals: There were no vitals filed for this visit.   Physical Exam: Physical Exam   Data: Labs, imaging, and micro were reviewed in Epic. Refer to Assessment and Plan below for full details in Problem-Based Charting.  Assessment & Plan:  No problem-specific Assessment & Plan notes found for this encounter.     Patient will follow up in ***  Mercie Eon, MD

## 2023-04-04 ENCOUNTER — Encounter: Payer: Self-pay | Admitting: Internal Medicine

## 2023-04-04 ENCOUNTER — Ambulatory Visit (INDEPENDENT_AMBULATORY_CARE_PROVIDER_SITE_OTHER): Payer: Medicare Other | Admitting: Internal Medicine

## 2023-04-04 VITALS — BP 116/70 | HR 81 | Temp 97.5°F | Ht 62.0 in | Wt 136.0 lb

## 2023-04-04 DIAGNOSIS — Z7984 Long term (current) use of oral hypoglycemic drugs: Secondary | ICD-10-CM | POA: Diagnosis not present

## 2023-04-04 DIAGNOSIS — E785 Hyperlipidemia, unspecified: Secondary | ICD-10-CM | POA: Diagnosis not present

## 2023-04-04 DIAGNOSIS — L718 Other rosacea: Secondary | ICD-10-CM | POA: Diagnosis not present

## 2023-04-04 DIAGNOSIS — E7800 Pure hypercholesterolemia, unspecified: Secondary | ICD-10-CM

## 2023-04-04 DIAGNOSIS — R059 Cough, unspecified: Secondary | ICD-10-CM | POA: Diagnosis not present

## 2023-04-04 DIAGNOSIS — I10 Essential (primary) hypertension: Secondary | ICD-10-CM

## 2023-04-04 DIAGNOSIS — E1129 Type 2 diabetes mellitus with other diabetic kidney complication: Secondary | ICD-10-CM | POA: Diagnosis not present

## 2023-04-04 DIAGNOSIS — F4321 Adjustment disorder with depressed mood: Secondary | ICD-10-CM | POA: Diagnosis not present

## 2023-04-04 DIAGNOSIS — K219 Gastro-esophageal reflux disease without esophagitis: Secondary | ICD-10-CM | POA: Diagnosis not present

## 2023-04-04 LAB — POCT GLYCOSYLATED HEMOGLOBIN (HGB A1C): Hemoglobin A1C: 7.1 % — AB (ref 4.0–5.6)

## 2023-04-04 LAB — GLUCOSE, CAPILLARY: Glucose-Capillary: 177 mg/dL — ABNORMAL HIGH (ref 70–99)

## 2023-04-04 NOTE — Assessment & Plan Note (Signed)
Metronidazole stopped during 2 week beach trip and because they were previously told they could stop it once rosacea resolved. Due to reoccurence of rosacea 2 days ago, restarted gel. Redness present on nose and cheeks today, but could be due to recent sun exposure at beach.   To continue with metronidazole gel for rosacea.

## 2023-04-04 NOTE — Assessment & Plan Note (Signed)
Had a GERD flare last Friday night/Saturday morning after returning from beach. Described waking up and sitting up in bed and experiencing burning that reached up to his throat. Associated this with the food he ate on his trip, such as hamburgers. Will continue to monitor since patient is aware of the therapeutic affect of trigger avoidance on GERD sxs and denies dysphagia or pain when swallowing.

## 2023-04-04 NOTE — Patient Instructions (Signed)
Dear Larry Wiley,  It was a pleasure seeing you in clinic today.  Keep taking your medicines as you are.  Please slow down when you are eating! I don't want you to choke on your food.  Your A1C was 7.1 today - I'd like this to be <7. I won't make any changes to your medicines today, but please work on eating less sugar. I will repeat this when I see you in the fall. '  Have a wonderful summer.  Sincerely, Dr. Mercie Eon

## 2023-04-04 NOTE — Assessment & Plan Note (Signed)
Patient and patient sister endorse having strong family support after recent death in family. Have not used resources offered at last visit, but they appreciate having options for support during this difficult time.   Continue to monitor mood and offer resources as needed for patient.

## 2023-04-04 NOTE — Assessment & Plan Note (Addendum)
Currently taking atorvastatin as prescribed. Is tolerating and denies muscle cramps, pain, or aches.  To continue with this medication regimen.

## 2023-04-04 NOTE — Assessment & Plan Note (Addendum)
Patient is taking HCTZ and metoprolol as prescribed and has well-controlled bp today at 116/70. He denies headaches.   To continue with this regimen.

## 2023-04-04 NOTE — Assessment & Plan Note (Signed)
Patient sister and patient have noticed that after eating, he experiences coughing for up to 15 minutes, and has been producing clear mucus. They associate this with rapid eating and incomplete chewing of food. Patient denies dysphagia, pain when swallowing, or  inability to swallow liquids. He states that once he chews slowly, he does not experience the coughing.   Patient advised to chew and consume food slower to avoid possible aspiration of food.

## 2023-04-04 NOTE — Assessment & Plan Note (Signed)
A1C 7.1 today and was 6.9 at last visit. He is taking Metformin 1000 BID, farxiga 10mg  daily, januvia 100mg  daily. Had a recent eye exam with Dr. Alden Hipp which went well and his prescription is stable. Endorses having to urinate 1 time per night a couple of nights a week. Endorses polydipsia due to increased thirst. Drinks sugar free sodas and flavored water in the evenings. No polyphagia. Denies neuropathy or vision changes. His sister assists him with taking his blood sugar and measured it during their beach trip; it was 165 fasting. Due to previously improved A1C, was checking fasting A1C once every 2 weeks.   Given patient reported medication adherence and recent vacation, encouraged focusing on low sugar diet and avoidance of processed foods. Will continue to monitor A1C.

## 2023-04-04 NOTE — Progress Notes (Signed)
This is a Psychologist, occupational Note.  The care of the patient was discussed with Dr. Lafonda Mosses and the assessment and plan was formulated with their assistance.  Please see their note for official documentation of the patient encounter.   Subjective:   Patient ID: Larry Wiley male   DOB: [birth year] 57 y.o.   MRN: [id number]  HPI: Larry Wiley is a 57 y.o. with a PMH of DM2, acne rosacea, HLD, HTN, GERD, and grief reaction who presents today for a follow up. He is with his sister, Drucilla Schmidt today, who assists with his care. He is doing well, and they recently returned from a 2 week trip to [facility] where they spent time with their family.   For the details of today's visit, please refer to the assessment and plan.    Past Medical History:  Diagnosis Date   Acne rosacea, papular type 02/25/2016   Angioedema of lips 06/19/2018   Bilateral inguinal hernia    Bilateral inguinal hernia 12/13/2021   Essential hypertension 08/18/2006   Fatigue 08/02/2021   Gastroesophageal reflux disease 09/27/2012   Intermittent symptoms, does not want therapy    Healthcare maintenance 09/27/2012   Hyperlipidemia LDL goal < 100 05/03/2007   Mental retardation 08/18/2006   Open-angle glaucoma 07/25/2013   Overweight (BMI 25.0-29.9) 09/27/2012   Type 2 diabetes mellitus with proteinuria or microalbuminuria 08/18/2006   Current Outpatient Medications  Medication Sig Dispense Refill   ACCU-CHEK FASTCLIX LANCETS MISC check blood sugar up to 1 time a day as instructed 102 each 5   atorvastatin (LIPITOR) 40 MG tablet TAKE 1 TABLET BY MOUTH EVERY DAY 90 tablet 3   Blood Glucose Monitoring Suppl (ACCU-CHEK GUIDE) w/Device KIT 1 each by Does not apply route daily. check blood sugar up to 1 time a day as instructed. 1 kit 1   cetirizine (ZYRTEC ALLERGY) 10 MG tablet Take 1 tablet by mouth once daily 30 tablet 5   dapagliflozin propanediol (FARXIGA) 10 MG TABS tablet Take 1 tablet (10 mg total) by mouth daily  before breakfast. 90 tablet 3   glucose blood (ACCU-CHEK GUIDE) test strip Check blood sugars daily 100 strip 5   hydrochlorothiazide (MICROZIDE) 12.5 MG capsule TAKE 1 CAPSULE BY MOUTH EVERY DAY 90 capsule 3   JANUVIA 100 MG tablet TAKE 1 TABLET BY MOUTH EVERY DAY 90 tablet 3   latanoprost (XALATAN) 0.005 % ophthalmic solution Place 1 drop into both eyes at bedtime.     metFORMIN (GLUCOPHAGE) 1000 MG tablet TAKE 1 TABLET (1,000 MG TOTAL) BY MOUTH TWICE A DAY WITH FOOD 180 tablet 3   metoprolol succinate (TOPROL-XL) 25 MG 24 hr tablet TAKE 1/2 TABLET BY MOUTH EVERY DAY 45 tablet 3   metroNIDAZOLE (METROCREAM) 0.75 % cream Apply 1 Application topically daily as needed (rosacea). 45 g 1   No current facility-administered medications for this visit.   Family History  Problem Relation Age of Onset   Hyperlipidemia Mother    Hypertension Mother    Liver cancer Mother    Diabetes Father    Heart attack Father    Cancer Sister        liver   Cancer Brother        lung   Social History   Socioeconomic History   Marital status: Single    Spouse name: Not on file   Number of children: 0   Years of education: Not on file   Highest education level: Not on file  Occupational History   Not on file  Tobacco Use   Smoking status: Never   Smokeless tobacco: Never  Vaping Use   Vaping Use: Never used  Substance and Sexual Activity   Alcohol use: No    Alcohol/week: 0.0 standard drinks of alcohol   Drug use: No   Sexual activity: Never  Other Topics Concern   Not on file  Social History Narrative   Current Social History 10/31/2021        Patient lives with his sister in a home which is 1 story. There are 4 steps up to the entrance the patient uses with a railing      Patient's method of transportation is via family member.      The highest level of education was some high school.      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      Identified important Relationships are  "his family"      Pets : 0       Interests / Fun: "Sports, watch TV, car shows"       Current Stressors: "none"       Religious / Personal Beliefs: "none"       Social Determinants of Health   Financial Resource Strain: Low Risk  (12/06/2022)   Overall Financial Resource Strain (CARDIA)    Difficulty of Paying Living Expenses: Not hard at all  Food Insecurity: No Food Insecurity (12/06/2022)   Hunger Vital Sign    Worried About Running Out of Food in the Last Year: Never true    Ran Out of Food in the Last Year: Never true  Transportation Needs: No Transportation Needs (12/06/2022)   PRAPARE - Administrator, Civil Service (Medical): No    Lack of Transportation (Non-Medical): No  Physical Activity: Insufficiently Active (12/06/2022)   Exercise Vital Sign    Days of Exercise per Week: 7 days    Minutes of Exercise per Session: 10 min  Stress: No Stress Concern Present (12/06/2022)   [employer] of Occupational Health - Occupational Stress Questionnaire    Feeling of Stress : Not at all  Social Connections: Socially Isolated (12/06/2022)   Social Connection and Isolation Panel [NHANES]    Frequency of Communication with Friends and Family: More than three times a week    Frequency of Social Gatherings with Friends and Family: More than three times a week    Attends Religious Services: Never    Database administrator or Organizations: No    Attends Banker Meetings: Never    Marital Status: Never married   Review of Systems: Pertinent items are noted in HPI. Objective:  Physical Exam: Vitals:   04/04/23 0951  BP: 116/70  Pulse: 81  Temp: (!) 97.5 F (36.4 C)  TempSrc: Oral  SpO2: 97%  Weight: 136 lb (61.7 kg)  Height: 5\' 2"  (1.575 m)    Constitutional: NAD, appears comfortable Cardiovascular: RRR, no murmurs, rubs, or gallops.  Pulmonary/Chest: CTAB, no wheezes, rales, or rhonchi.  Skin: Redness over the nose and cheeks Psychiatric:  Normal mood and affect  Assessment & Plan:   Type 2 diabetes mellitus with microalbuminuria, without long-term current use of insulin (HCC) A1C 7.1 today and was 6.9 at last visit. He is taking Metformin 1000 BID, farxiga 10mg  daily, januvia 100mg  daily. Had a recent eye exam with Dr. Alden Hipp which went well and his prescription is stable. Endorses having to urinate 1 time per night  a couple of nights a week. Endorses polydipsia due to increased thirst. Drinks sugar free sodas and flavored water in the evenings. No polyphagia. Denies neuropathy or vision changes. His sister assists him with taking his blood sugar and measured it during their beach trip; it was 165 fasting. Due to previously improved A1C, was checking fasting A1C once every 2 weeks.   Given patient reported medication adherence and recent vacation, encouraged focusing on low sugar diet and avoidance of processed foods. Will continue to monitor A1C.    Acne rosacea, papular type Metronidazole stopped during 2 week beach trip and because they were previously told they could stop it once rosacea resolved. Due to reoccurence of rosacea 2 days ago, restarted gel. Redness present on nose and cheeks today, but could be due to recent sun exposure at beach.   To continue with metronidazole gel for rosacea.   Hyperlipidemia Currently taking atorvastatin as prescribed. Is tolerating and denies muscle cramps, pain, or aches.  To continue with this medication regimen.   Essential hypertension Patient is taking HCTZ and metoprolol as prescribed and has well-controlled bp today at 116/70. He denies headaches.   To continue with this regimen.   Gastroesophageal reflux disease Had a GERD flare last Friday night/Saturday morning after returning from beach. Described waking up and sitting up in bed and experiencing burning that reached up to his throat. Associated this with the food he ate on his trip, such as hamburgers. Will continue to monitor  since patient is aware of the therapeutic affect of trigger avoidance on GERD sxs and denies dysphagia or pain when swallowing.   Cough Patient sister and patient have noticed that after eating, he experiences coughing for up to 15 minutes, and has been producing clear mucus. They associate this with rapid eating and incomplete chewing of food. Patient denies dysphagia, pain when swallowing, or  inability to swallow liquids. He states that once he chews slowly, he does not experience the coughing.   Patient advised to chew and consume food slower to avoid possible aspiration of food.   Grief reaction Patient and patient sister endorse having strong family support after recent death in family. Have not used resources offered at last visit, but they appreciate having options for support during this difficult time.   Continue to monitor mood and offer resources as needed for patient.

## 2023-05-19 ENCOUNTER — Other Ambulatory Visit: Payer: Self-pay | Admitting: Internal Medicine

## 2023-05-19 DIAGNOSIS — E785 Hyperlipidemia, unspecified: Secondary | ICD-10-CM

## 2023-05-29 ENCOUNTER — Other Ambulatory Visit: Payer: Self-pay | Admitting: Internal Medicine

## 2023-05-29 DIAGNOSIS — I10 Essential (primary) hypertension: Secondary | ICD-10-CM

## 2023-07-25 DIAGNOSIS — H00021 Hordeolum internum right upper eyelid: Secondary | ICD-10-CM | POA: Diagnosis not present

## 2023-08-08 DIAGNOSIS — H00021 Hordeolum internum right upper eyelid: Secondary | ICD-10-CM | POA: Diagnosis not present

## 2023-08-28 NOTE — Progress Notes (Unsigned)
[facility]: Clinic Note  Subjective:  History of Present Illness: Larry Wiley is a 57 y.o. year old male who presents for routine follow up of his chronic medical conditions. I met him in February. Here with sister?  How are they coping with loss of brother?  Filling dapaglifozin still?  Due for lots of HCM - colonoscopy Flu   Grief reaction - I provided Larry Wiley with resources for Larry Wiley bereavement counseling and BHT at our clinic with Larry Wiley if he develops a need in the future   Essential hypertension - Well controlled - continue HCTZ 12.5mg  daily, Metoprolol XL 25mg  daily    Gastroesophageal reflux disease - Larry Wiley stopped his PPI last time, and Larry Wiley feels great off it, no symptoms of GERD today - I will resolve this problem at next visit if symptoms don't recur - No PPI or further workup needed at this time   Type 2 diabetes mellitus with microalbuminuria, without long-term current use of insulin (HCC) - A1C 6.9 in 08/2022 - Continue Metformin 1000mg  BID, Farxiga 10mg  daily, Januvia 100mg  daily - He is due for his eye exam, which he has scheduled with Larry. Alden Hipp - I will talk to our pharmacist to see if we need to swap out SGLT2s for insurance coverage   Acne rosacea, papular type - continue Metronidazole gel as needed    Please refer to Assessment and Plan below for full details in Problem-Based Charting.   Past Medical History:  Patient Active Problem List   Diagnosis Date Noted   Cough 04/04/2023   Grief reaction 12/06/2022   Carpal tunnel syndrome 12/21/2020   Asymmetrical right sensorineural hearing loss 10/31/2017   Acne rosacea, papular type 02/25/2016   Open-angle glaucoma 07/25/2013   Gastroesophageal reflux disease 09/27/2012   Overweight (BMI 25.0-29.9) 09/27/2012   Healthcare maintenance 09/27/2012   Hyperlipidemia 05/03/2007   Type 2 diabetes mellitus with microalbuminuria, without long-term current use of insulin (HCC)  08/18/2006   Intellectual disability 08/18/2006   Essential hypertension 08/18/2006      Medications:  Current Outpatient Medications:    ACCU-CHEK FASTCLIX LANCETS MISC, check blood sugar up to 1 time a day as instructed, Disp: 102 each, Rfl: 5   atorvastatin (LIPITOR) 40 MG tablet, TAKE 1 TABLET BY MOUTH EVERY DAY, Disp: 90 tablet, Rfl: 3   Blood Glucose Monitoring Suppl (ACCU-CHEK GUIDE) w/Device KIT, 1 each by Does not apply route daily. check blood sugar up to 1 time a day as instructed., Disp: 1 kit, Rfl: 1   cetirizine (ZYRTEC ALLERGY) 10 MG tablet, Take 1 tablet by mouth once daily, Disp: 30 tablet, Rfl: 5   dapagliflozin propanediol (FARXIGA) 10 MG TABS tablet, Take 1 tablet (10 mg total) by mouth daily before breakfast., Disp: 90 tablet, Rfl: 3   glucose blood (ACCU-CHEK GUIDE) test strip, Check blood sugars daily, Disp: 100 strip, Rfl: 5   hydrochlorothiazide (MICROZIDE) 12.5 MG capsule, TAKE 1 CAPSULE BY MOUTH EVERY DAY, Disp: 90 capsule, Rfl: 3   JANUVIA 100 MG tablet, TAKE 1 TABLET BY MOUTH EVERY DAY, Disp: 90 tablet, Rfl: 3   latanoprost (XALATAN) 0.005 % ophthalmic solution, Place 1 drop into both eyes at bedtime., Disp: , Rfl:    metFORMIN (GLUCOPHAGE) 1000 MG tablet, TAKE 1 TABLET (1,000 MG TOTAL) BY MOUTH TWICE A DAY WITH FOOD, Disp: 180 tablet, Rfl: 3   metoprolol succinate (TOPROL-XL) 25 MG 24 hr tablet, TAKE 1/2 TABLET BY MOUTH EVERY DAY, Disp: 45 tablet, Rfl:  3   metroNIDAZOLE (METROCREAM) 0.75 % cream, Apply 1 Application topically daily as needed (rosacea)., Disp: 45 g, Rfl: 1   Allergies: Allergies  Allergen Reactions   Ace Inhibitors Other (See Comments)     angiodema   Amoxicillin Hives    Has patient had a PCN reaction causing immediate rash, facial/tongue/throat swelling, SOB or lightheadedness with hypotension: Yes Has patient had a PCN reaction causing severe rash involving mucus membranes or skin necrosis: No Has patient had a PCN reaction that required  hospitalization No Has patient had a PCN reaction occurring within the last 10 years: Yes If all of the above answers are "NO", then may proceed with Cephalosporin use.   Amoxicillin-Pot Clavulanate Hives    Augmentin    Cat Hair Extract Swelling   Dust Mite Extract Swelling       Objective:   Vitals: There were no vitals filed for this visit.   Physical Exam: Physical Exam   Data: Labs, imaging, and micro were reviewed in Epic. Refer to Assessment and Plan below for full details in Problem-Based Charting.  Assessment & Plan:  No problem-specific Assessment & Plan notes found for this encounter.     Patient will follow up in ***  Mercie Eon, MD

## 2023-08-29 ENCOUNTER — Ambulatory Visit: Payer: Medicare Other | Admitting: Internal Medicine

## 2023-08-29 ENCOUNTER — Encounter: Payer: Self-pay | Admitting: Internal Medicine

## 2023-08-29 VITALS — BP 120/78 | HR 82 | Temp 97.5°F | Ht 62.0 in | Wt 134.4 lb

## 2023-08-29 DIAGNOSIS — E7800 Pure hypercholesterolemia, unspecified: Secondary | ICD-10-CM

## 2023-08-29 DIAGNOSIS — L718 Other rosacea: Secondary | ICD-10-CM | POA: Diagnosis not present

## 2023-08-29 DIAGNOSIS — I10 Essential (primary) hypertension: Secondary | ICD-10-CM | POA: Diagnosis not present

## 2023-08-29 DIAGNOSIS — Z23 Encounter for immunization: Secondary | ICD-10-CM

## 2023-08-29 DIAGNOSIS — Z1211 Encounter for screening for malignant neoplasm of colon: Secondary | ICD-10-CM

## 2023-08-29 DIAGNOSIS — Z7984 Long term (current) use of oral hypoglycemic drugs: Secondary | ICD-10-CM | POA: Diagnosis not present

## 2023-08-29 DIAGNOSIS — Z0000 Encounter for general adult medical examination without abnormal findings: Secondary | ICD-10-CM

## 2023-08-29 DIAGNOSIS — E785 Hyperlipidemia, unspecified: Secondary | ICD-10-CM | POA: Diagnosis not present

## 2023-08-29 DIAGNOSIS — R809 Proteinuria, unspecified: Secondary | ICD-10-CM | POA: Diagnosis not present

## 2023-08-29 DIAGNOSIS — E1129 Type 2 diabetes mellitus with other diabetic kidney complication: Secondary | ICD-10-CM

## 2023-08-29 LAB — POCT GLYCOSYLATED HEMOGLOBIN (HGB A1C): Hemoglobin A1C: 7.4 % — AB (ref 4.0–5.6)

## 2023-08-29 LAB — GLUCOSE, CAPILLARY: Glucose-Capillary: 147 mg/dL — ABNORMAL HIGH (ref 70–99)

## 2023-08-29 NOTE — Assessment & Plan Note (Signed)
-   A1C up to 7.4 today from 7.1 last time - Continue Metformin 1g BID, Farxiga 10mg  daily, and Januvia 100mg  daily - I counseled on dietary changes - stop eating sugary snacks at night - Follow up in 3 months for repeat A1C. If increasing, we may need to discuss starting insulin - Eye exams with Dr. Dione Booze are up to date - Urine MACR today

## 2023-08-29 NOTE — Assessment & Plan Note (Signed)
-   continue metronidazole gel

## 2023-08-29 NOTE — Assessment & Plan Note (Signed)
-   Chronic and stable - Continue hydrochlorothiazide 12.5mg  daily & Metoprolol 12.5mg  daily - If kidneys are damaged, will swap Metoprolol for an ARB

## 2023-08-29 NOTE — Patient Instructions (Signed)
Thank you, [name] for allowing [place] to provide your care today. Today we discussed your blood pressure (looks great!), your diabetes, and your rosacea. For your diabetes, please try not to eat sugary snacks at night. I'll keep you medicines the same, then see you back in 3 months to repeat your A1C and make sure it's not going up.    I have ordered the following labs for you:  Lab Orders         BMP8+Anion Gap         Microalbumin / Creatinine Urine Ratio         Glucose, capillary         POC Hbg A1C      Tests ordered today:  Cologuard  Referrals ordered today:   Referral Orders  No referral(s) requested today     I have ordered the following medication/changed the following medications:   Stop the following medications: There are no discontinued medications.   Start the following medications: No orders of the defined types were placed in this encounter.    Return in about 3 months (around 11/29/2023) for Diabetes.    Remember: Please try to avoid having big sugary snacks at night!  Should you have any questions or concerns please call the internal medicine clinic at [phone].     Mercie Eon, MD Faculty, Internal Medicine Teaching [facility]

## 2023-08-29 NOTE — Assessment & Plan Note (Signed)
-  Continue Atorvastatin 40 mg daily

## 2023-08-29 NOTE — Assessment & Plan Note (Signed)
-   Flu shot today - Cologuard ordered today

## 2023-08-30 LAB — BMP8+ANION GAP
Anion Gap: 16 mmol/L (ref 10.0–18.0)
BUN/Creatinine Ratio: 16 (ref 9–20)
BUN: 14 mg/dL (ref 6–24)
CO2: 21 mmol/L (ref 20–29)
Calcium: 10.1 mg/dL (ref 8.7–10.2)
Chloride: 101 mmol/L (ref 96–106)
Creatinine, Ser: 0.85 mg/dL (ref 0.76–1.27)
Glucose: 158 mg/dL — ABNORMAL HIGH (ref 70–99)
Potassium: 4.5 mmol/L (ref 3.5–5.2)
Sodium: 138 mmol/L (ref 134–144)
eGFR: 100 mL/min/{1.73_m2} (ref >59)

## 2023-08-30 LAB — MICROALBUMIN / CREATININE URINE RATIO
Creatinine, Urine: 65.2 mg/dL
Microalb/Creat Ratio: 20 mg/g{creat} (ref 0–29)
Microalbumin, Urine: 13 ug/mL

## 2023-09-26 DIAGNOSIS — Z1211 Encounter for screening for malignant neoplasm of colon: Secondary | ICD-10-CM | POA: Diagnosis not present

## 2023-09-26 DIAGNOSIS — Z1212 Encounter for screening for malignant neoplasm of rectum: Secondary | ICD-10-CM | POA: Diagnosis not present

## 2023-10-07 LAB — COLOGUARD: COLOGUARD: NEGATIVE

## 2023-10-17 ENCOUNTER — Other Ambulatory Visit: Payer: Self-pay | Admitting: Internal Medicine

## 2023-10-17 DIAGNOSIS — E1129 Type 2 diabetes mellitus with other diabetic kidney complication: Secondary | ICD-10-CM

## 2023-10-22 NOTE — Telephone Encounter (Signed)
Medication sent to pharmacy  

## 2023-11-13 ENCOUNTER — Encounter: Payer: Self-pay | Admitting: *Deleted

## 2023-11-27 NOTE — Progress Notes (Signed)
 [facility]: Clinic Note  Subjective:  History of Present Illness: ANTWANE GROSE is a 58 y.o. year old male with cognitive impairment who presents for 3 month follow up of chronic medical conditions, including T2DM. He is here with his sister today and is doing great.   Please refer to Assessment and Plan below for full details in Problem-Based Charting.   Past Medical History:  Patient Active Problem List   Diagnosis Date Noted   Carpal tunnel syndrome 12/21/2020   Asymmetrical right sensorineural hearing loss 10/31/2017   Acne rosacea, papular type 02/25/2016   Open-angle glaucoma 07/25/2013   Healthcare maintenance 09/27/2012   Hyperlipidemia 05/03/2007   Type 2 diabetes mellitus with microalbuminuria, without long-term current use of insulin  (HCC) 08/18/2006   Intellectual disability 08/18/2006   Essential hypertension 08/18/2006     Medications:  Current Outpatient Medications:    ACCU-CHEK FASTCLIX LANCETS MISC, check blood sugar up to 1 time a day as instructed, Disp: 102 each, Rfl: 5   atorvastatin  (LIPITOR) 40 MG tablet, TAKE 1 TABLET BY MOUTH EVERY DAY, Disp: 90 tablet, Rfl: 3   Blood Glucose Monitoring Suppl (ACCU-CHEK GUIDE) w/Device KIT, 1 each by Does not apply route daily. check blood sugar up to 1 time a day as instructed., Disp: 1 kit, Rfl: 1   cetirizine  (ZYRTEC  ALLERGY ) 10 MG tablet, Take 1 tablet by mouth once daily, Disp: 30 tablet, Rfl: 5   dapagliflozin  propanediol (FARXIGA ) 10 MG TABS tablet, Take 1 tablet (10 mg total) by mouth daily before breakfast., Disp: 90 tablet, Rfl: 3   glucose blood (ACCU-CHEK GUIDE) test strip, Check blood sugars daily, Disp: 100 strip, Rfl: 5   hydrochlorothiazide  (MICROZIDE ) 12.5 MG capsule, TAKE 1 CAPSULE BY MOUTH EVERY DAY, Disp: 90 capsule, Rfl: 3   JANUVIA  100 MG tablet, TAKE 1 TABLET BY MOUTH EVERY DAY, Disp: 90 tablet, Rfl: 3   latanoprost  (XALATAN ) 0.005 % ophthalmic solution, Place 1 drop into both  eyes at bedtime., Disp: , Rfl:    metFORMIN  (GLUCOPHAGE ) 1000 MG tablet, TAKE 1 TABLET (1,000 MG TOTAL) BY MOUTH TWICE A DAY WITH FOOD, Disp: 180 tablet, Rfl: 3   metoprolol  succinate (TOPROL -XL) 25 MG 24 hr tablet, TAKE 1/2 TABLET BY MOUTH EVERY DAY, Disp: 45 tablet, Rfl: 3   metroNIDAZOLE  (METROCREAM ) 0.75 % cream, Apply 1 Application topically daily as needed (rosacea)., Disp: 45 g, Rfl: 1   Allergies: Allergies  Allergen Reactions   Ace Inhibitors Other (See Comments)     angiodema   Amoxicillin Hives    Has patient had a PCN reaction causing immediate rash, facial/tongue/throat swelling, SOB or lightheadedness with hypotension: Yes Has patient had a PCN reaction causing severe rash involving mucus membranes or skin necrosis: No Has patient had a PCN reaction that required hospitalization No Has patient had a PCN reaction occurring within the last 10 years: Yes If all of the above answers are NO, then may proceed with Cephalosporin use.   Amoxicillin-Pot Clavulanate Hives    Augmentin    Cat Dander Swelling   Dust Mite Extract Swelling      Objective:   Vitals: Vitals:   11/28/23 0951  BP: 118/71  Pulse: 79  Temp: (!) 97.4 F (36.3 C)  SpO2: 100%     Physical Exam: Physical Exam Constitutional:      Appearance: Normal appearance.  Cardiovascular:     Pulses: Normal pulses.     Heart sounds: Normal heart sounds.  Pulmonary:  Effort: Pulmonary effort is normal.     Breath sounds: Normal breath sounds.  Neurological:     Mental Status: He is alert.      Data: Labs, imaging, and micro were reviewed in Epic. Refer to Assessment and Plan below for full details in Problem-Based Charting.  Assessment & Plan:  Type 2 diabetes mellitus with microalbuminuria, without long-term current use of insulin  (HCC) - A1C down to 6.7 today! Excellent news - Continue Metformin  1g BID, Farxiga  10mg  daily, and Januvia  100mg  daily - Eye exams with Dr. Octavia are up to date -  Urine MACR normal this year - Follow up in 6 months for A1C  Hyperlipidemia - chronic and stable - continue atorvastatin  40mg  daily  Essential hypertension - Chronic and stable - Continue hydrochlorothiazide  12.5mg  daily & Metoprolol  12.5mg  daily - If kidneys are damaged, will swap Metoprolol  for an ARB  Healthcare maintenance - cologuard was normal, so due again in 2027 - I encouraged him to get a covid booster  Acne rosacea, papular type - continue metronidazole  gel     Patient will follow up in 6 months   Mliss Foot, MD

## 2023-11-28 ENCOUNTER — Ambulatory Visit (INDEPENDENT_AMBULATORY_CARE_PROVIDER_SITE_OTHER): Payer: Medicare Other | Admitting: Internal Medicine

## 2023-11-28 ENCOUNTER — Encounter: Payer: Self-pay | Admitting: Internal Medicine

## 2023-11-28 VITALS — BP 118/71 | HR 79 | Temp 97.4°F | Ht 62.0 in | Wt 132.2 lb

## 2023-11-28 DIAGNOSIS — E7800 Pure hypercholesterolemia, unspecified: Secondary | ICD-10-CM

## 2023-11-28 DIAGNOSIS — Z7984 Long term (current) use of oral hypoglycemic drugs: Secondary | ICD-10-CM | POA: Diagnosis not present

## 2023-11-28 DIAGNOSIS — R809 Proteinuria, unspecified: Secondary | ICD-10-CM | POA: Diagnosis not present

## 2023-11-28 DIAGNOSIS — L718 Other rosacea: Secondary | ICD-10-CM | POA: Diagnosis not present

## 2023-11-28 DIAGNOSIS — Z0000 Encounter for general adult medical examination without abnormal findings: Secondary | ICD-10-CM

## 2023-11-28 DIAGNOSIS — I10 Essential (primary) hypertension: Secondary | ICD-10-CM | POA: Diagnosis not present

## 2023-11-28 DIAGNOSIS — E785 Hyperlipidemia, unspecified: Secondary | ICD-10-CM

## 2023-11-28 DIAGNOSIS — E1129 Type 2 diabetes mellitus with other diabetic kidney complication: Secondary | ICD-10-CM | POA: Diagnosis not present

## 2023-11-28 LAB — POCT GLYCOSYLATED HEMOGLOBIN (HGB A1C): Hemoglobin A1C: 6.7 % — AB (ref 4.0–5.6)

## 2023-11-28 LAB — GLUCOSE, CAPILLARY: Glucose-Capillary: 133 mg/dL — ABNORMAL HIGH (ref 70–99)

## 2023-11-28 MED ORDER — METRONIDAZOLE 0.75 % EX CREA: 1.0000 | TOPICAL_CREAM | Freq: Every day | CUTANEOUS | 1 refills | Status: AC | PRN

## 2023-11-28 NOTE — Assessment & Plan Note (Signed)
-   continue metronidazole gel

## 2023-11-28 NOTE — Assessment & Plan Note (Signed)
-   chronic and stable - continue atorvastatin 40mg  daily

## 2023-11-28 NOTE — Assessment & Plan Note (Signed)
-   Chronic and stable - Continue hydrochlorothiazide 12.5mg  daily & Metoprolol 12.5mg  daily - If kidneys are damaged, will swap Metoprolol for an ARB

## 2023-11-28 NOTE — Assessment & Plan Note (Signed)
-   cologuard was normal, so due again in 2027 - I encouraged him to get a covid booster

## 2023-11-28 NOTE — Patient Instructions (Signed)
 Thank you, Mr.Athol A Englander for allowing us  to provide your care today. Today we discussed your diabetes and your rosacea. Your A1C is 6.7 - this is great news! Keep taking your medicines as you are    I have ordered the following labs for you:   Lab Orders         Glucose, capillary         POC Hbg A1C      Tests ordered today:  None  Referrals ordered today:   Referral Orders  No referral(s) requested today     I have ordered the following medication/changed the following medications:   Stop the following medications: Medications Discontinued During This Encounter  Medication Reason   metroNIDAZOLE  (METROCREAM ) 0.75 % cream Reorder     Start the following medications: Meds ordered this encounter  Medications   metroNIDAZOLE  (METROCREAM ) 0.75 % cream    Sig: Apply 1 Application topically daily as needed (rosacea).    Dispense:  45 g    Refill:  1    DX Code Needed  .     Return in about 6 months (around 05/27/2024).     Should you have any questions or concerns please call the internal medicine clinic at [phone].     Armanie Martine, MD Faculty, Internal Medicine Teaching Progam [facility]

## 2023-11-28 NOTE — Assessment & Plan Note (Signed)
-   A1C down to 6.7 today! Excellent news - Continue Metformin  1g BID, Farxiga  10mg  daily, and Januvia  100mg  daily - Eye exams with Dr. Candi Chafe are up to date - Urine MACR normal this year - Follow up in 6 months for A1C

## 2023-12-05 ENCOUNTER — Encounter: Payer: Medicare Other | Admitting: Internal Medicine

## 2023-12-08 IMAGING — CT CT ABD-PELV W/O CM
2 of 4 series · 16 of 46 positions shown, 18 images · non-contrast
Comparison: August 15, 2015

CLINICAL DATA: Complicated hernia.



[Series 2: axial st · axial · 0.75mm/px · z∈[-474,-14]mm · 13 of 102 slices shown, 15 images]
[im 5/102  soft-tissue]
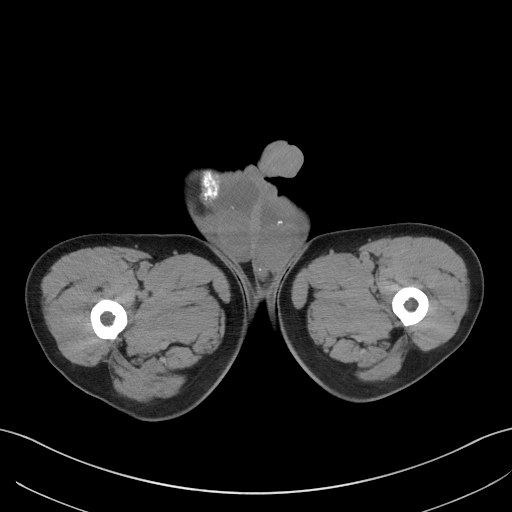
[im 5/102  bone]
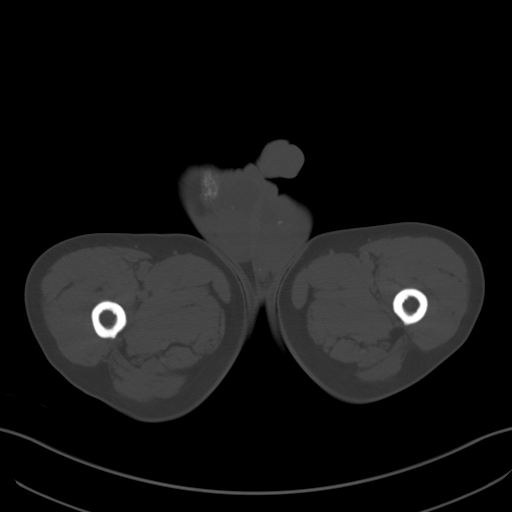
[im 14/102  soft-tissue]
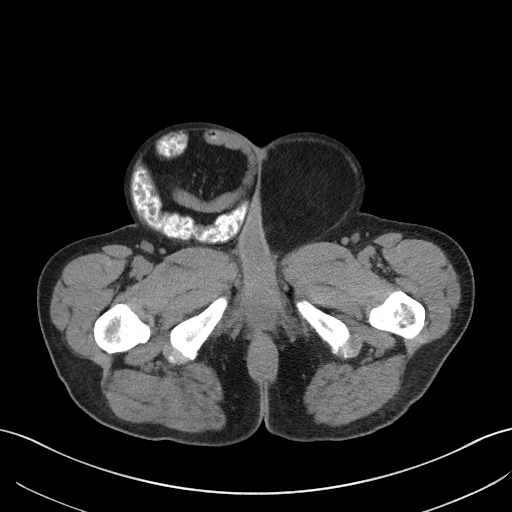
[im 22/102  soft-tissue]
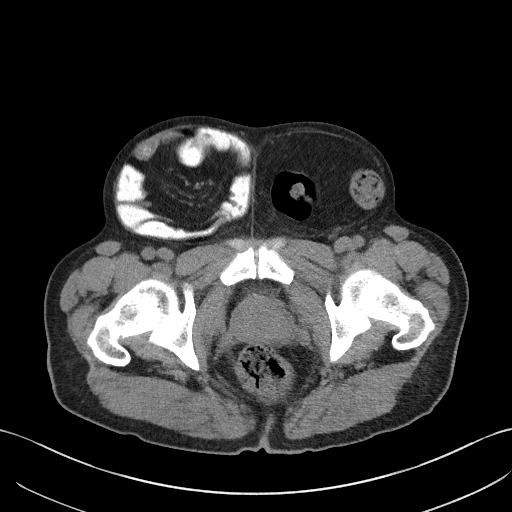
[im 27/102  soft-tissue]
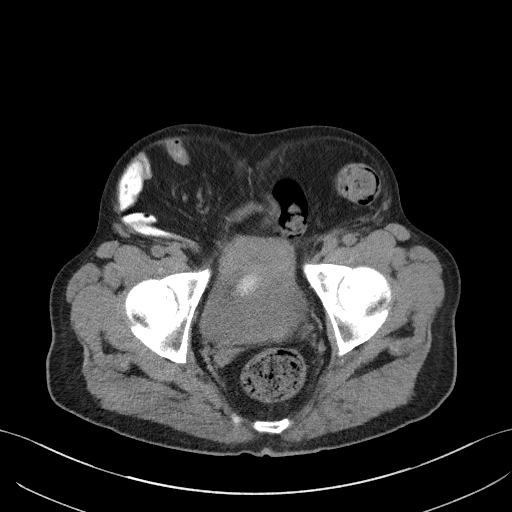
[im 36/102  soft-tissue]
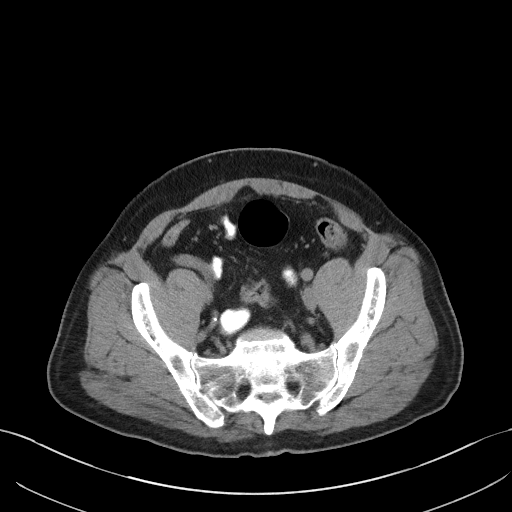
[im 44/102  soft-tissue]
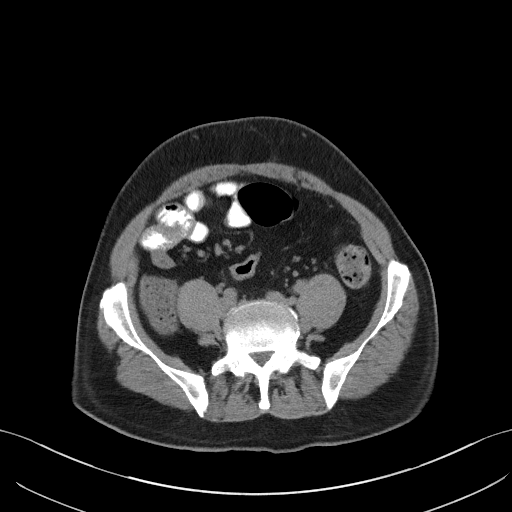
[im 53/102  soft-tissue]
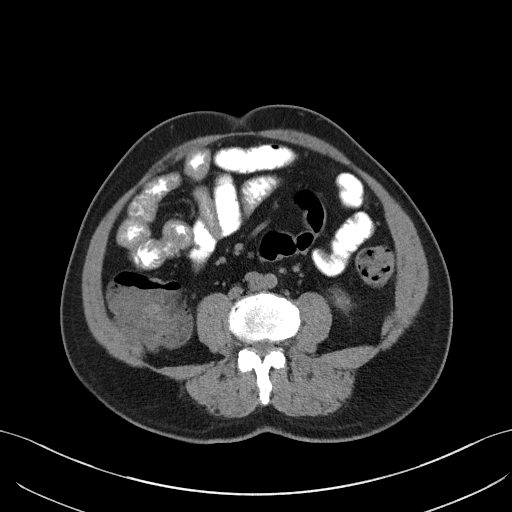
[im 58/102  soft-tissue]
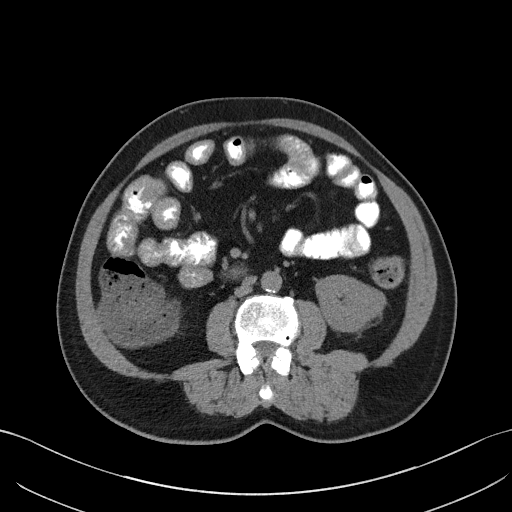
[im 66/102  soft-tissue]
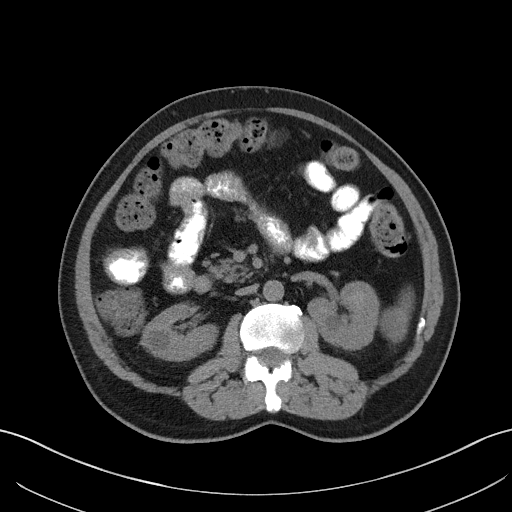
[im 66/102  bone]
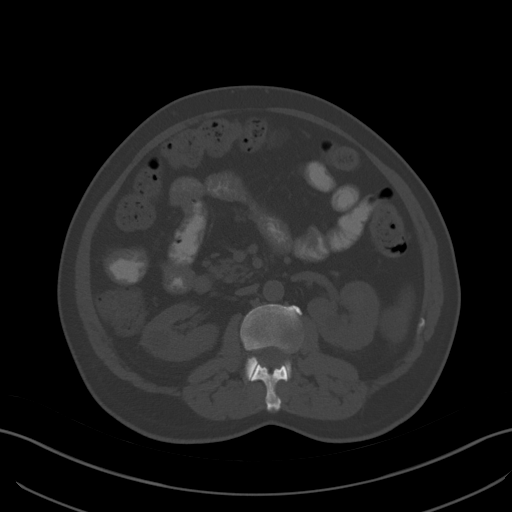
[im 75/102  soft-tissue]
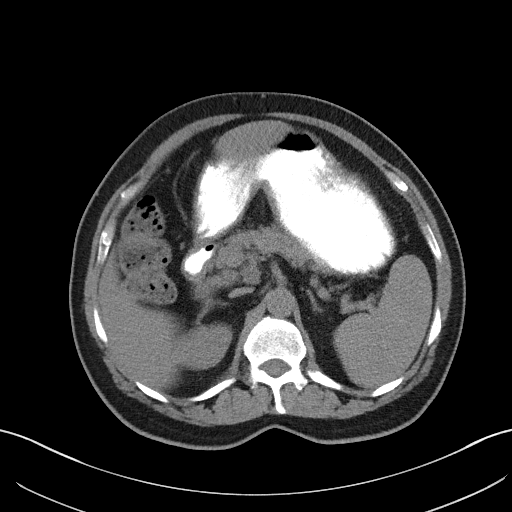
[im 80/102  soft-tissue]
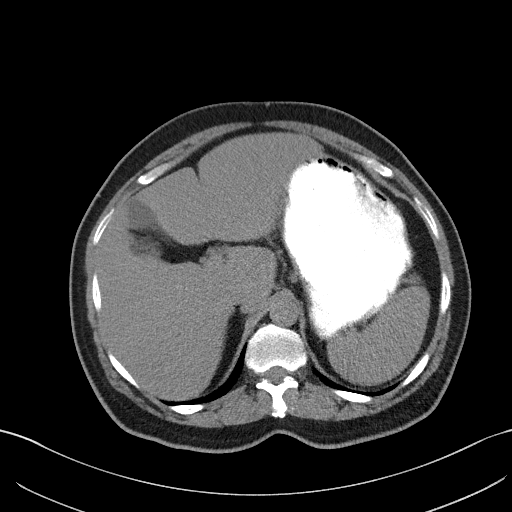
[im 88/102  soft-tissue]
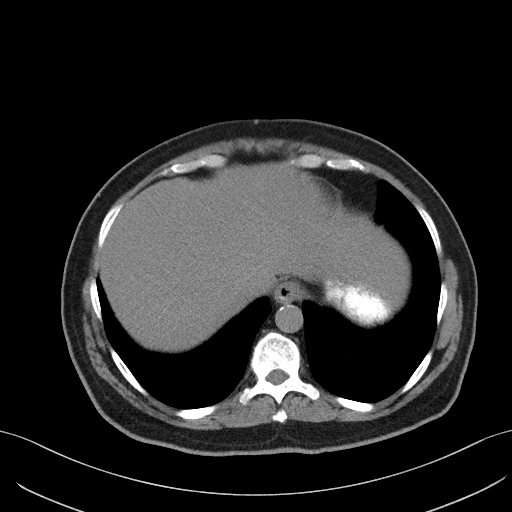
[im 97/102  soft-tissue]
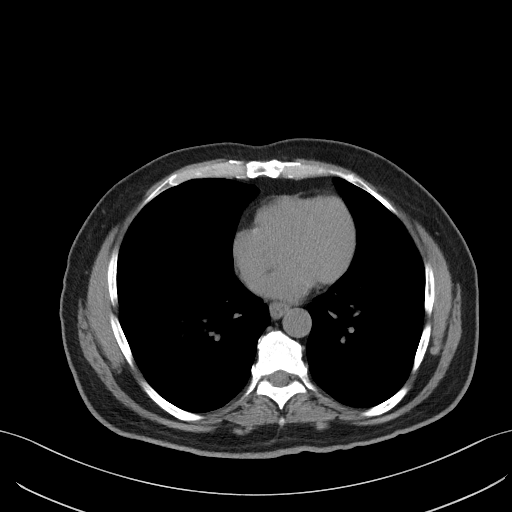

[Series 5: coronal st · coronal · 0.79mm/px · 3 of 97 slices shown]
[im 33/97  soft-tissue]
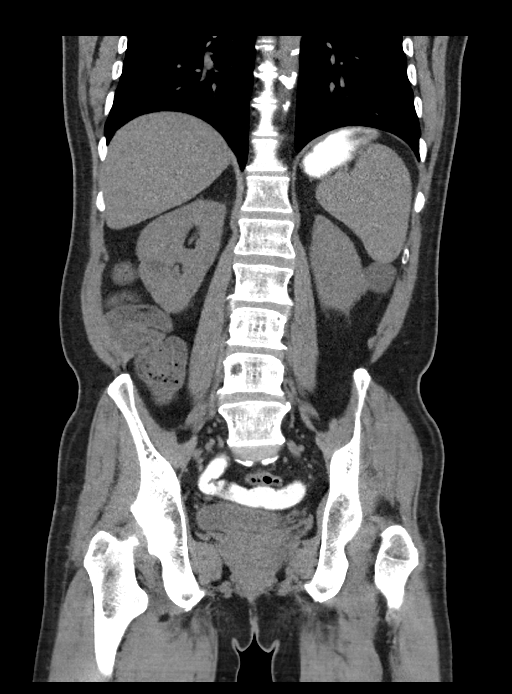
[im 43/97  soft-tissue]
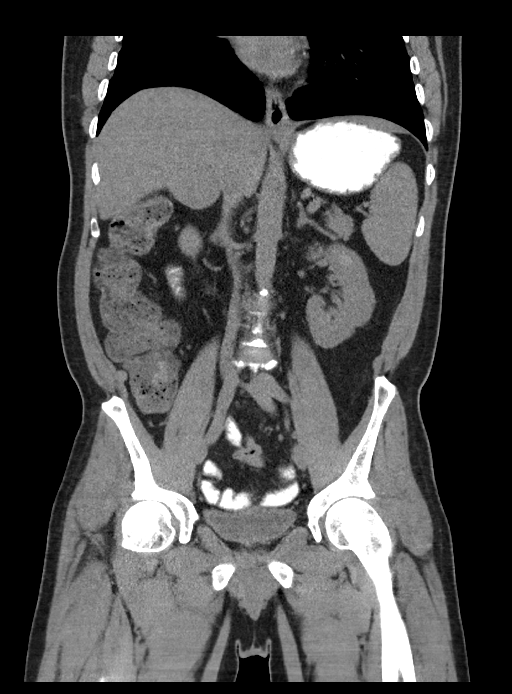
[im 54/97  soft-tissue]
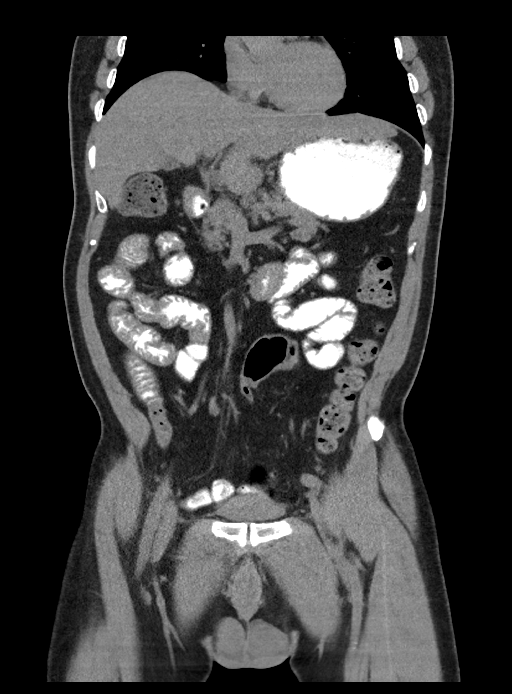

[16 of 46 positions shown; findings below may reference images not displayed]

FINDINGS: Lower chest: No acute abnormality.

Hepatobiliary: No suspicious hepatic lesion on this noncontrast
examination. Gallbladder is unremarkable. No biliary ductal
dilation.

Pancreas: No pancreatic ductal dilation or evidence of acute
inflammation.

Spleen: No splenomegaly.

Adrenals/Urinary Tract: Bilateral adrenal glands appear normal.
Bilateral renal cysts measuring up to 3.3 cm on the left. Urinary
bladder is unremarkable for degree of distension.

Stomach/Bowel: Radiopaque enteric contrast material traverses distal
loops of small bowel. Stomach is distended with ingested material
without wall thickening. No pathologic dilation of small or large
bowel. Moderate volume of formed stool throughout the colon.

Vascular/Lymphatic: Aortic atherosclerosis without aneurysmal
dilation. No pathologically enlarged abdominal or pelvic lymph
nodes.

Reproductive: Mild prostate gland enlargement. Bilateral hydroceles.

Other: There are large bilateral inguinal hernias both of which
contain fat with the right-sided hernia also containing
nonobstructed portions of distal ileum and the left-sided hernia
also containing nonobstructed portion of sigmoid colon.

Musculoskeletal: Mild multilevel degenerative changes spine. No
acute osseous abnormality.
IMPRESSION: 1. Large bilateral inguinal hernias containing fat and nonobstructed
portions of bowel.
2. Moderate volume of formed stool throughout the colon.
3. Aortic Atherosclerosis ([id number].J).

## 2024-02-14 DIAGNOSIS — E119 Type 2 diabetes mellitus without complications: Secondary | ICD-10-CM | POA: Diagnosis not present

## 2024-02-14 DIAGNOSIS — H5501 Congenital nystagmus: Secondary | ICD-10-CM | POA: Diagnosis not present

## 2024-02-14 DIAGNOSIS — H0102A Squamous blepharitis right eye, upper and lower eyelids: Secondary | ICD-10-CM | POA: Diagnosis not present

## 2024-02-14 DIAGNOSIS — H0102B Squamous blepharitis left eye, upper and lower eyelids: Secondary | ICD-10-CM | POA: Diagnosis not present

## 2024-02-14 DIAGNOSIS — H2513 Age-related nuclear cataract, bilateral: Secondary | ICD-10-CM | POA: Diagnosis not present

## 2024-02-14 DIAGNOSIS — H40013 Open angle with borderline findings, low risk, bilateral: Secondary | ICD-10-CM | POA: Diagnosis not present

## 2024-02-14 LAB — HM DIABETES EYE EXAM

## 2024-02-17 ENCOUNTER — Other Ambulatory Visit: Payer: Self-pay | Admitting: Internal Medicine

## 2024-02-17 DIAGNOSIS — I10 Essential (primary) hypertension: Secondary | ICD-10-CM

## 2024-02-18 NOTE — Telephone Encounter (Signed)
 Medication sent to pharmacy

## 2024-02-28 ENCOUNTER — Telehealth: Payer: Self-pay | Admitting: *Deleted

## 2024-02-28 ENCOUNTER — Other Ambulatory Visit: Payer: Self-pay | Admitting: Internal Medicine

## 2024-02-28 DIAGNOSIS — E1129 Type 2 diabetes mellitus with other diabetic kidney complication: Secondary | ICD-10-CM

## 2024-02-28 MED ORDER — METFORMIN HCL 1000 MG PO TABS: 1000.0000 mg | ORAL_TABLET | Freq: Two times a day (BID) | ORAL | 3 refills | Status: AC

## 2024-02-28 NOTE — Telephone Encounter (Signed)
 Source  Larry Wiley (Patient)   Subject  Larry Wiley (Patient)   Topic  Clinical - Medication Question    Communication  Reason for CRM: Patient called in about medicine. I have a request in for one of them but this medicine dapagliflozin  propanediol (FARXIGA ) 10 MG TABS tablet was not on his current list of medication, patients callback number is    [phone].

## 2024-02-28 NOTE — Telephone Encounter (Signed)
 Copied from CRM [id number]. Topic: Clinical - Medication Refill >> Feb 28, 2024  2:25 PM Shamecia H wrote: Medication: metFORMIN  (GLUCOPHAGE ) 1000 MG tablet  Has the patient contacted their pharmacy? Yes (Agent: If no, request that the patient contact the pharmacy for the refill. If patient does not wish to contact the pharmacy document the reason why and proceed with request.) (Agent: If yes, when and what did the pharmacy advise?)  This is the patient's preferred pharmacy:  [id number] - [place], [place] - [address] [facility] [place] [address] Phone: [phone] Fax: [phone]  Is this the correct pharmacy for this prescription? Yes If no, delete pharmacy and type the correct one.   Has the prescription been filled recently? Yes  Is the patient out of the medication? No  Has the patient been seen for an appointment in the last year OR does the patient have an upcoming appointment? Yes  Can we respond through MyChart? Yes  Agent: Please be advised that Rx refills may take up to 3 business days. We ask that you follow-up with your pharmacy.

## 2024-02-29 MED ORDER — DAPAGLIFLOZIN PROPANEDIOL 10 MG PO TABS: 10.0000 mg | ORAL_TABLET | Freq: Every day | ORAL | 3 refills | Status: AC

## 2024-03-04 ENCOUNTER — Telehealth: Payer: Self-pay | Admitting: *Deleted

## 2024-03-04 ENCOUNTER — Other Ambulatory Visit (HOSPITAL_COMMUNITY): Payer: Self-pay

## 2024-03-04 NOTE — Telephone Encounter (Signed)
 Call to patient message left that a message will be sent to Dr. Jarvis Mesa to see if she wants to change the medication and will forward to Jada to see if a PA request has come in for the medication. Copied from CRM [id number]. Topic: Clinical - Prescription Issue >> Mar 03, 2024  4:08 PM Adrianna P wrote: Reason for CRM: Pharmacy said insurance is not covering dapagliflozin  propanediol (FARXIGA ) 10 MG TABS tablet [[id number]] please call [phone] or [phone]

## 2024-03-05 ENCOUNTER — Other Ambulatory Visit (HOSPITAL_COMMUNITY): Payer: Self-pay

## 2024-03-05 NOTE — Telephone Encounter (Signed)
 Copied from CRM [id number]. Topic: Clinical - Prescription Issue >> Mar 03, 2024  4:08 PM Adrianna P wrote: Reason for CRM: Pharmacy said insurance is not covering dapagliflozin  propanediol (FARXIGA ) 10 MG TABS tablet [[id number]] please call [phone] or [phone]

## 2024-03-12 ENCOUNTER — Ambulatory Visit (INDEPENDENT_AMBULATORY_CARE_PROVIDER_SITE_OTHER)

## 2024-03-12 ENCOUNTER — Other Ambulatory Visit: Payer: Self-pay

## 2024-03-12 VITALS — Ht 62.0 in | Wt 135.0 lb

## 2024-03-12 DIAGNOSIS — E1129 Type 2 diabetes mellitus with other diabetic kidney complication: Secondary | ICD-10-CM

## 2024-03-12 DIAGNOSIS — Z0000 Encounter for general adult medical examination without abnormal findings: Secondary | ICD-10-CM

## 2024-03-12 NOTE — Progress Notes (Signed)
 Because this visit was a virtual/telehealth visit,  certain criteria was not obtained, such a blood pressure, CBG if applicable, and timed get up and go. Any medications not marked as "taking" were not mentioned during the medication reconciliation part of the visit. Any vitals not documented were not able to be obtained due to this being a telehealth visit or patient was unable to self-report a recent blood pressure reading due to a lack of equipment at home via telehealth. Vitals that have been documented are verbally provided by the patient.   Subjective:   Larry Wiley is a 58 y.o. who presents for a Medicare Wellness preventive visit.  As a reminder, Annual Wellness Visits don't include a physical exam, and some assessments may be limited, especially if this visit is performed virtually. We may recommend an in-person follow-up visit with your provider if needed.  Visit Complete: Virtual I connected with  [name] on 03/12/24 by a audio enabled telemedicine application and verified that I am speaking with the correct person using two identifiers.  Patient Location: Home  Provider Location: Office/Clinic  I discussed the limitations of evaluation and management by telemedicine. The patient expressed understanding and agreed to proceed.  Vital Signs: Because this visit was a virtual/telehealth visit, some criteria may be missing or patient reported. Any vitals not documented were not able to be obtained and vitals that have been documented are patient reported.  VideoDeclined- This patient declined Librarian, academic. Therefore the visit was completed with audio only.  Persons Participating in Visit: Patient assisted by sister.  AWV Questionnaire: No: Patient Medicare AWV questionnaire was not completed prior to this visit.  Cardiac Risk Factors include: advanced age (>76men, >55 women);diabetes mellitus;dyslipidemia;family history of premature  cardiovascular disease;hypertension;male gender     Objective:     Today's Vitals   03/12/24 1003  Weight: 135 lb (61.2 kg)  Height: 5\' 2"  (1.575 m)  PainSc: 0-No pain   Body mass index is 24.69 kg/m.     03/12/2024   10:05 AM 08/29/2023    8:41 AM 04/04/2023    9:53 AM 12/06/2022   11:07 AM 12/06/2022    9:14 AM 10/25/2022    9:29 AM 09/07/2022    9:02 AM  Advanced Directives  Does Patient Have a Medical Advance Directive? No No No No No No No  Would patient like information on creating a medical advance directive? No - Patient declined No - Patient declined No - Patient declined No - Patient declined No - Patient declined No - Patient declined No - Patient declined    Current Medications (verified) Outpatient Encounter Medications as of 03/12/2024  Medication Sig   ACCU-CHEK FASTCLIX LANCETS MISC check blood sugar up to 1 time a day as instructed   atorvastatin  (LIPITOR) 40 MG tablet TAKE 1 TABLET BY MOUTH EVERY DAY   Blood Glucose Monitoring Suppl (ACCU-CHEK GUIDE) w/Device KIT 1 each by Does not apply route daily. check blood sugar up to 1 time a day as instructed.   cetirizine  (ZYRTEC  ALLERGY ) 10 MG tablet Take 1 tablet by mouth once daily   dapagliflozin  propanediol (FARXIGA ) 10 MG TABS tablet Take 1 tablet (10 mg total) by mouth daily before breakfast.   glucose blood (ACCU-CHEK GUIDE) test strip Check blood sugars daily   hydrochlorothiazide  (MICROZIDE ) 12.5 MG capsule TAKE 1 CAPSULE BY MOUTH EVERY DAY   JANUVIA  100 MG tablet TAKE 1 TABLET BY MOUTH EVERY DAY   latanoprost  (XALATAN ) 0.005 %  ophthalmic solution Place 1 drop into both eyes at bedtime.   metFORMIN  (GLUCOPHAGE ) 1000 MG tablet Take 1 tablet (1,000 mg total) by mouth 2 (two) times daily with a meal.   metoprolol  succinate (TOPROL -XL) 25 MG 24 hr tablet TAKE 1/2 TABLET BY MOUTH EVERY DAY   metroNIDAZOLE  (METROCREAM ) 0.75 % cream Apply 1 Application topically daily as needed (rosacea).   No facility-administered  encounter medications on file as of 03/12/2024.    Allergies (verified) Ace inhibitors, Amoxicillin, Amoxicillin-pot clavulanate, Cat dander, and Dust mite extract   History: Past Medical History:  Diagnosis Date   Acne rosacea, papular type 02/25/2016   Angioedema of lips 06/19/2018   Bilateral inguinal hernia    Bilateral inguinal hernia 12/13/2021   Essential hypertension 08/18/2006   Fatigue 08/02/2021   Gastroesophageal reflux disease 09/27/2012   Intermittent symptoms, does not want therapy    Healthcare maintenance 09/27/2012   Hyperlipidemia LDL goal < 100 05/03/2007   Mental retardation 08/18/2006   Open-angle glaucoma 07/25/2013   Overweight (BMI 25.0-29.9) 09/27/2012   Type 2 diabetes mellitus with proteinuria or microalbuminuria 08/18/2006   Past Surgical History:  Procedure Laterality Date   EYE SURGERY  1970   lazy eye   INGUINAL HERNIA REPAIR Right 06/22/2022   Procedure: OPEN RIGHT INGUINAL HERNIA REPAIR WITH MESH;  Surgeon: Shela Derby, MD;  Location: [facility] OR;  Service: General;  Laterality: Right;   INGUINAL HERNIA REPAIR Left 09/19/2022   Procedure: OPEN LEFT INGUINAL HERNIA REPAIR WITH MESH;  Surgeon: Shela Derby, MD;  Location: [facility] OR;  Service: General;  Laterality: Left;   INSERTION OF MESH Left 09/19/2022   Procedure: INSERTION OF MESH;  Surgeon: Shela Derby, MD;  Location: [facility] OR;  Service: General;  Laterality: Left;   Family History  Problem Relation Age of Onset   Hyperlipidemia Mother    Hypertension Mother    Liver cancer Mother    Diabetes Father    Heart attack Father    Cancer Sister        liver   Cancer Brother        lung   Social History   Socioeconomic History   Marital status: Single    Spouse name: Not on file   Number of children: 0   Years of education: Not on file   Highest education level: Not on file  Occupational History   Not on file  Tobacco Use   Smoking status: Never   Smokeless tobacco: Never   Vaping Use   Vaping status: Never Used  Substance and Sexual Activity   Alcohol use: No    Alcohol/week: 0.0 standard drinks of alcohol   Drug use: No   Sexual activity: Never  Other Topics Concern   Not on file  Social History Narrative   Current Social History 10/31/2021        Patient lives with his sister in a home which is 1 story. There are 4 steps up to the entrance the patient uses with a railing      Patient's method of transportation is via family member.      The highest level of education was some high school.      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      Identified important Relationships are "his family"      Pets : 0       Interests / Fun: "Sports, watch TV, car shows"       Current Stressors: "none"  Religious / Personal Beliefs: "none"       Social Drivers of Corporate investment banker Strain: Low Risk  (03/12/2024)   Overall Financial Resource Strain (CARDIA)    Difficulty of Paying Living Expenses: Not hard at all  Food Insecurity: No Food Insecurity (03/12/2024)   Hunger Vital Sign    Worried About Running Out of Food in the Last Year: Never true    Ran Out of Food in the Last Year: Never true  Transportation Needs: No Transportation Needs (03/12/2024)   PRAPARE - Administrator, Civil Service (Medical): No    Lack of Transportation (Non-Medical): No  Physical Activity: Insufficiently Active (03/12/2024)   Exercise Vital Sign    Days of Exercise per Week: 7 days    Minutes of Exercise per Session: 20 min  Stress: No Stress Concern Present (03/12/2024)   [employer] of Occupational Health - Occupational Stress Questionnaire    Feeling of Stress : Not at all  Social Connections: Socially Isolated (03/12/2024)   Social Connection and Isolation Panel [NHANES]    Frequency of Communication with Friends and Family: More than three times a week    Frequency of Social Gatherings with Friends and Family: More than  three times a week    Attends Religious Services: Never    Database administrator or Organizations: No    Attends Engineer, structural: Never    Marital Status: Never married    Tobacco Counseling Counseling given: Not Answered    Clinical Intake:  Pre-visit preparation completed: Yes  Pain : No/denies pain Pain Score: 0-No pain     BMI - recorded: 24.69 Nutritional Status: BMI of 19-24  Normal Nutritional Risks: None Diabetes: Yes CBG done?: No Did pt. bring in CBG monitor from home?: No  Lab Results  Component Value Date   HGBA1C 6.7 (A) 11/28/2023   HGBA1C 7.4 (A) 08/29/2023   HGBA1C 7.1 (A) 04/04/2023     How often do you need to have someone help you when you read instructions, pamphlets, or other written materials from your doctor or pharmacy?: 1 - Never  Interpreter Needed?: No  Information entered by :: Karla Vines N. Anavey Coombes, LPN.   Activities of Daily Living     03/12/2024   10:09 AM 04/04/2023    9:52 AM  In your present state of health, do you have any difficulty performing the following activities:  Hearing? 1 0  Comment HEARING AID FOR RIGHT EAR   Vision? 0 0  Difficulty concentrating or making decisions? 0 0  Walking or climbing stairs? 0 0  Dressing or bathing? 0 0  Doing errands, shopping? 0 0  Preparing Food and eating ? N   Using the Toilet? N   In the past six months, have you accidently leaked urine? N   Do you have problems with loss of bowel control? N   Managing your Medications? N   Managing your Finances? N   Housekeeping or managing your Housekeeping? N     Patient Care Team: Driscilla George, MD as PCP - General (Internal Medicine) Maris Sickle, MD (Ophthalmology) Patel, Donika K, DO as Consulting Physician (Neurology)  Indicate any recent Medical Services you may have received from other than [facility] providers in the past year (date may be approximate).     Assessment:    This is a routine wellness examination for  Larry Wiley.  Hearing/Vision screen Hearing Screening - Comments:: Patient has hearing loss in right ear.  Patient wears hearing aids. Vision Screening - Comments:: Wears rx glasses - up to date with routine eye exams with [facility]    Goals Addressed               This Visit's Progress     Patient Stated (pt-stated)        03/12/24: Continue to maintain my health.       Depression Screen     03/12/2024   10:07 AM 08/29/2023    9:36 AM 04/04/2023   11:17 AM 12/06/2022   11:07 AM 12/06/2022    9:15 AM 10/25/2022    9:29 AM 07/18/2022    9:45 AM  PHQ 2/9 Scores  PHQ - 2 Score 0 0 0 0 0 0 0  PHQ- 9 Score 0 0         Fall Risk     03/12/2024   10:06 AM 08/29/2023    9:36 AM 04/04/2023    9:52 AM 12/06/2022   11:07 AM 12/06/2022    9:15 AM  Fall Risk   Falls in the past year? 0 0 0 0 0  Number falls in past yr: 0 0 0 0 0  Injury with Fall? 0 0 0 0 0  Risk for fall due to : No Fall Risks   No Fall Risks   Follow up Falls evaluation completed Falls evaluation completed Falls evaluation completed Falls evaluation completed;Falls prevention discussed Falls evaluation completed    MEDICARE RISK AT HOME:  Medicare Risk at Home Any stairs in or around the home?: Yes (4 steps in the front with railing) If so, are there any without handrails?: No Home free of loose throw rugs in walkways, pet beds, electrical cords, etc?: Yes Adequate lighting in your home to reduce risk of falls?: Yes Life alert?: Yes Use of a cane, walker or w/c?: Yes Grab bars in the bathroom?: No Shower chair or bench in shower?: No Elevated toilet seat or a handicapped toilet?: No  TIMED UP AND GO:  Was the test performed?  No  Cognitive Function: 6CIT completed    03/12/2024   10:10 AM  MMSE - Mini Mental State Exam  Not completed: Unable to complete        03/12/2024   10:10 AM 12/06/2022   11:07 AM 10/31/2021   10:05 AM  6CIT Screen  What Year? 0 points 0 points 0 points  What month? 0 points 0  points 0 points  What time? 0 points 0 points 0 points  Count back from 20 0 points 0 points 0 points  Months in reverse 0 points 0 points 0 points  Repeat phrase 0 points 0 points 0 points  Total Score 0 points 0 points 0 points    Immunizations Immunization History  Administered Date(s) Administered   Influenza Inj Mdck Quad Pf 07/10/2022   Influenza Split 12/25/2011, 09/27/2012   Influenza Whole 09/22/2005, 11/19/2006, 08/05/2007, 07/17/2008, 01/24/2010, 09/26/2010   Influenza, Seasonal, Injecte, Preservative Fre 08/29/2023   Influenza,inj,Quad PF,6+ Mos 07/25/2013, 09/04/2014, 08/30/2015, 06/29/2016, 07/27/2017, 07/18/2018, 07/01/2019, 09/21/2020, 08/02/2021   PFIZER(Purple Top)SARS-COV-2 Vaccination 01/15/2020, 02/16/2020, 04/29/2021   Pfizer Covid-19 Vaccine Bivalent Booster 37yrs & up 09/30/2021   Pneumococcal Conjugate-13 12/25/2014, 12/28/2016, 03/21/2018   Pneumococcal Polysaccharide-23 12/25/2011   Td 01/24/2010   Tdap 05/25/2020   Zoster Recombinant(Shingrix) 02/09/2022, 04/13/2022    Screening Tests Health Maintenance  Topic Date Due   Pneumococcal Vaccine 64-60 Years old (3 of 3 - PCV20 or PCV21) 03/22/2023  COVID-19 Vaccine (5 - 2024-25 season) 06/24/2023   HEMOGLOBIN A1C  02/25/2024   INFLUENZA VACCINE  05/23/2024   Diabetic kidney evaluation - eGFR measurement  08/28/2024   Diabetic kidney evaluation - Urine ACR  08/28/2024   FOOT EXAM  11/27/2024   OPHTHALMOLOGY EXAM  02/13/2025   Medicare Annual Wellness (AWV)  03/12/2025   DTaP/Tdap/Td (3 - Td or Tdap) 05/25/2030   Hepatitis C Screening  Completed   HIV Screening  Completed   Zoster Vaccines- Shingrix  Completed   HPV VACCINES  Aged Out   Meningococcal B Vaccine  Aged Out   COLON CANCER SCREENING ANNUAL FOBT  Discontinued   Colonoscopy  Discontinued    Health Maintenance  Health Maintenance Due  Topic Date Due   Pneumococcal Vaccine 26-61 Years old (3 of 3 - PCV20 or PCV21) 03/22/2023    COVID-19 Vaccine (5 - 2024-25 season) 06/24/2023   HEMOGLOBIN A1C  02/25/2024   Health Maintenance Items Addressed: Yes Patient aware of current care gaps.  Immunization record was verified by [employer]. Patient is due for the following vaccines: Pneumonia and Covid.  Patient is due for HgA1C.  Additional Screening:  Vision Screening: Recommended annual ophthalmology exams for early detection of glaucoma and other disorders of the eye.  Dental Screening: Recommended annual dental exams for proper oral hygiene  Community Resource Referral / Chronic Care Management: CRR required this visit?  No   CCM required this visit?  No   Plan:    I have personally reviewed and noted the following in the patient's chart:   Medical and social history Use of alcohol, tobacco or illicit drugs  Current medications and supplements including opioid prescriptions. Patient is not currently taking opioid prescriptions. Functional ability and status Nutritional status Physical activity Advanced directives List of other physicians Hospitalizations, surgeries, and ER visits in previous 12 months Vitals Screenings to include cognitive, depression, and falls Referrals and appointments  In addition, I have reviewed and discussed with patient certain preventive protocols, quality metrics, and best practice recommendations. A written personalized care plan for preventive services as well as general preventive health recommendations were provided to patient.   Margette Sheldon, LPN   1/61/0960   After Visit Summary: (MyChart) Due to this being a telephonic visit, the after visit summary with patients personalized plan was offered to patient via MyChart   Notes: Patient aware of current care gaps.  Immunization record was verified by [employer]. Patient is due for the following vaccines: Pneumonia and Covid.  Patient is due for HgA1C.

## 2024-03-12 NOTE — Patient Instructions (Signed)
 Larry Wiley , Thank you for taking time out of your busy schedule to complete your Annual Wellness Visit with me. I enjoyed our conversation and look forward to speaking with you again next year. I, as well as your care team,  appreciate your ongoing commitment to your health goals. Please review the following plan we discussed and let me know if I can assist you in the future. Your Game plan/ To Do List    Referrals: If you haven't heard from the office you've been referred to, please reach out to them at the phone provided.   Follow up Visits: Next Medicare AWV with our clinical staff: 03/18/2025 AT 9:50 AM PHONE VISIT WITH NURSE   Have you seen your provider in the last 6 months (3 months if uncontrolled diabetes)? Yes Next Office Visit with your provider: 04/02/2024 AT 9:45 AM OFFICE VISIT (3 MONTH CHECK UP)  Clinician Recommendations:  Aim for 30 minutes of exercise or brisk walking, 6-8 glasses of water, and 5 servings of fruits and vegetables each day.       This is a list of the screening recommended for you and due dates:  Health Maintenance  Topic Date Due   Pneumococcal Vaccination (3 of 3 - PCV20 or PCV21) 03/22/2023   COVID-19 Vaccine (5 - 2024-25 season) 06/24/2023   Hemoglobin A1C  02/25/2024   Flu Shot  05/23/2024   Yearly kidney function blood test for diabetes  08/28/2024   Yearly kidney health urinalysis for diabetes  08/28/2024   Complete foot exam   11/27/2024   Eye exam for diabetics  02/13/2025   Medicare Annual Wellness Visit  03/12/2025   DTaP/Tdap/Td vaccine (3 - Td or Tdap) 05/25/2030   Hepatitis C Screening  Completed   HIV Screening  Completed   Zoster (Shingles) Vaccine  Completed   HPV Vaccine  Aged Out   Meningitis B Vaccine  Aged Out   Stool Blood Test  Discontinued   Colon Cancer Screening  Discontinued    Advanced directives: (Declined) Advance directive discussed with you today. Even though you declined this today, please call our office should you  change your mind, and we can give you the proper paperwork for you to fill out. Advance Care Planning is important because it:  [x]  Makes sure you receive the medical care that is consistent with your values, goals, and preferences  [x]  It provides guidance to your family and loved ones and reduces their decisional burden about whether or not they are making the right decisions based on your wishes.  Follow the link provided in your after visit summary or read over the paperwork we have mailed to you to help you started getting your Advance Directives in place. If you need assistance in completing these, please reach out to us  so that we can help you!  See attachments for Preventive Care and Fall Prevention Tips.

## 2024-03-13 MED ORDER — ACCU-CHEK FASTCLIX LANCETS MISC: 5 refills | Status: AC

## 2024-03-13 MED ORDER — GLUCOSE BLOOD VI STRP: ORAL_STRIP | 12 refills | Status: AC

## 2024-03-14 NOTE — Progress Notes (Signed)
 I reviewed the AWV findings of the licensed medical professional who conducted the visit. I was present in the office suite and immediately available to provide assistance and direction throughout the time the service was provided.   Will check A1C and discuss vaccines at upcoming appointment

## 2024-03-31 NOTE — Progress Notes (Unsigned)
 [facility]: Clinic Note  Subjective:  History of Present Illness: Larry Wiley is a 58 y.o. year old male who presents for routine follow up of his chronic medical conditions. He is here with his sister today.  Pneumovax?   Type 2 diabetes mellitus with microalbuminuria, without long-term current use of insulin  (HCC) - A1C down to 6.7 today! Excellent news - Continue Metformin  1g BID, Farxiga  10mg  daily, and Januvia  100mg  daily - Eye exams with Dr. Candi Chafe are up to date - Urine MACR normal this year - Follow up in 6 months for A1C   Hyperlipidemia - chronic and stable - continue atorvastatin  40mg  daily   Essential hypertension - Chronic and stable - Continue hydrochlorothiazide  12.5mg  daily & Metoprolol  12.5mg  daily - If kidneys are damaged, will swap Metoprolol  for an ARB   Healthcare maintenance - cologuard was normal, so due again in 2027 - I encouraged him to get a covid booster   Acne rosacea, papular type - continue metronidazole  gel  Please refer to Assessment and Plan below for full details in Problem-Based Charting.   Past Medical History:  Patient Active Problem List   Diagnosis Date Noted   Carpal tunnel syndrome 12/21/2020   Asymmetrical right sensorineural hearing loss 10/31/2017   Acne rosacea, papular type 02/25/2016   Open-angle glaucoma 07/25/2013   Healthcare maintenance 09/27/2012   Hyperlipidemia 05/03/2007   Type 2 diabetes mellitus with microalbuminuria, without long-term current use of insulin  (HCC) 08/18/2006   Intellectual disability 08/18/2006   Essential hypertension 08/18/2006      Medications:  Current Outpatient Medications:    Accu-Chek FastClix Lancets MISC, check blood sugar up to 1 time a day as instructed, Disp: 102 each, Rfl: 5   atorvastatin  (LIPITOR) 40 MG tablet, TAKE 1 TABLET BY MOUTH EVERY DAY, Disp: 90 tablet, Rfl: 3   Blood Glucose Monitoring Suppl (ACCU-CHEK GUIDE) w/Device KIT, 1 each by Does  not apply route daily. check blood sugar up to 1 time a day as instructed., Disp: 1 kit, Rfl: 1   cetirizine  (ZYRTEC  ALLERGY ) 10 MG tablet, Take 1 tablet by mouth once daily, Disp: 30 tablet, Rfl: 5   dapagliflozin  propanediol (FARXIGA ) 10 MG TABS tablet, Take 1 tablet (10 mg total) by mouth daily before breakfast., Disp: 90 tablet, Rfl: 3   glucose blood (ACCU-CHEK GUIDE) test strip, Check blood sugars daily, Disp: 100 strip, Rfl: 5   glucose blood test strip, Use as instructed, Disp: 100 each, Rfl: 12   hydrochlorothiazide  (MICROZIDE ) 12.5 MG capsule, TAKE 1 CAPSULE BY MOUTH EVERY DAY, Disp: 90 capsule, Rfl: 3   JANUVIA  100 MG tablet, TAKE 1 TABLET BY MOUTH EVERY DAY, Disp: 90 tablet, Rfl: 3   latanoprost  (XALATAN ) 0.005 % ophthalmic solution, Place 1 drop into both eyes at bedtime., Disp: , Rfl:    metFORMIN  (GLUCOPHAGE ) 1000 MG tablet, Take 1 tablet (1,000 mg total) by mouth 2 (two) times daily with a meal., Disp: 180 tablet, Rfl: 3   metoprolol  succinate (TOPROL -XL) 25 MG 24 hr tablet, TAKE 1/2 TABLET BY MOUTH EVERY DAY, Disp: 45 tablet, Rfl: 3   metroNIDAZOLE  (METROCREAM ) 0.75 % cream, Apply 1 Application topically daily as needed (rosacea)., Disp: 45 g, Rfl: 1   Allergies: Allergies  Allergen Reactions   Ace Inhibitors Other (See Comments)     angiodema   Amoxicillin Hives    Has patient had a PCN reaction causing immediate rash, facial/tongue/throat swelling, SOB or lightheadedness with hypotension: Yes Has patient had  a PCN reaction causing severe rash involving mucus membranes or skin necrosis: No Has patient had a PCN reaction that required hospitalization No Has patient had a PCN reaction occurring within the last 10 years: Yes If all of the above answers are "NO", then may proceed with Cephalosporin use.   Amoxicillin-Pot Clavulanate Hives    Augmentin    Cat Dander Swelling   Dust Mite Extract Swelling       Objective:   Vitals: There were no vitals filed for this  visit.   Physical Exam: Physical Exam   Data: Labs, imaging, and micro were reviewed in Epic. Refer to Assessment and Plan below for full details in Problem-Based Charting.  Assessment & Plan:  No problem-specific Assessment & Plan notes found for this encounter.     Patient will follow up in ***  Driscilla George, MD

## 2024-04-02 ENCOUNTER — Ambulatory Visit (INDEPENDENT_AMBULATORY_CARE_PROVIDER_SITE_OTHER): Admitting: Internal Medicine

## 2024-04-02 ENCOUNTER — Encounter: Payer: Self-pay | Admitting: Internal Medicine

## 2024-04-02 VITALS — BP 129/75 | HR 79 | Temp 97.6°F | Ht 62.0 in | Wt 132.6 lb

## 2024-04-02 DIAGNOSIS — L718 Other rosacea: Secondary | ICD-10-CM | POA: Diagnosis not present

## 2024-04-02 DIAGNOSIS — Z7984 Long term (current) use of oral hypoglycemic drugs: Secondary | ICD-10-CM

## 2024-04-02 DIAGNOSIS — E7800 Pure hypercholesterolemia, unspecified: Secondary | ICD-10-CM | POA: Diagnosis not present

## 2024-04-02 DIAGNOSIS — R809 Proteinuria, unspecified: Secondary | ICD-10-CM | POA: Diagnosis not present

## 2024-04-02 DIAGNOSIS — E1129 Type 2 diabetes mellitus with other diabetic kidney complication: Secondary | ICD-10-CM | POA: Diagnosis not present

## 2024-04-02 DIAGNOSIS — Z23 Encounter for immunization: Secondary | ICD-10-CM

## 2024-04-02 DIAGNOSIS — E785 Hyperlipidemia, unspecified: Secondary | ICD-10-CM | POA: Diagnosis not present

## 2024-04-02 DIAGNOSIS — I10 Essential (primary) hypertension: Secondary | ICD-10-CM | POA: Diagnosis not present

## 2024-04-02 DIAGNOSIS — Z0000 Encounter for general adult medical examination without abnormal findings: Secondary | ICD-10-CM

## 2024-04-02 LAB — GLUCOSE, CAPILLARY: Glucose-Capillary: 159 mg/dL — ABNORMAL HIGH (ref 70–99)

## 2024-04-02 LAB — POCT GLYCOSYLATED HEMOGLOBIN (HGB A1C): Hemoglobin A1C: 7.7 % — AB (ref 4.0–5.6)

## 2024-04-02 NOTE — Assessment & Plan Note (Signed)
-   chronic and not quite at goal - A1C 7.7 today - He is on max dose of oral medicines including Metformin  1g BID, farxiga  10mg  daily, and Januvia  100mg  daily.  - He eats a lot of sweets, so his sister is going to stop buying these, and work on lifestyle, since he is hesitant to start any injections - repeat A1c at next visit - He is up to date on eye and foot exams - [facility] was normal this year

## 2024-04-02 NOTE — Patient Instructions (Signed)
 Thank you, Larry Wiley for allowing us  to provide your care today. Today we discussed your rosacea, your diabetes, and your cholesterol.    I have ordered the following labs for you:   Lab Orders         Glucose, capillary         Lipid Profile         BMP8+Anion Gap         POC Hbg A1C       Referrals ordered today:   Referral Orders  No referral(s) requested today     I have ordered the following medication/changed the following medications:   Stop the following medications: There are no discontinued medications.   Start the following medications: No orders of the defined types were placed in this encounter.    Return in about 6 months (around 10/02/2024) for Chronic medical conditions, Diabetes.    Remember:  - Continue to do regular physical activity, such as walking on your treadmill  - In the fall, I recommend that you get a flu and covid vaccine at the pharmacy - We gave you the pneumonia vaccine today  Should you have any questions or concerns please call the internal medicine clinic at [phone].     Larry Zbikowski, MD Faculty, Internal Medicine Teaching [facility]

## 2024-04-02 NOTE — Assessment & Plan Note (Signed)
-   chronic and stable; repeat blood pressure was at goal today - continue hydrochlorothiazide  12.5mg  daily & Metoprolol  12.5mg  daily - Could swap Metoprolol  for an ARB in future if needed

## 2024-04-02 NOTE — Assessment & Plan Note (Signed)
-   chronic and stable - lipid panel today - continue atorvastatin  40mg  daily

## 2024-04-02 NOTE — Assessment & Plan Note (Signed)
Prevnar 20 vaccine given today.

## 2024-04-02 NOTE — Assessment & Plan Note (Signed)
-   chronic and stable - continue metronidazole  gel & good sun protection

## 2024-04-03 LAB — BMP8+ANION GAP
Anion Gap: 19 mmol/L — ABNORMAL HIGH (ref 10.0–18.0)
BUN/Creatinine Ratio: 13 (ref 9–20)
BUN: 12 mg/dL (ref 6–24)
CO2: 19 mmol/L — ABNORMAL LOW (ref 20–29)
Calcium: 9.8 mg/dL (ref 8.7–10.2)
Chloride: 100 mmol/L (ref 96–106)
Creatinine, Ser: 0.91 mg/dL (ref 0.76–1.27)
Glucose: 154 mg/dL — ABNORMAL HIGH (ref 70–99)
Potassium: 4.2 mmol/L (ref 3.5–5.2)
Sodium: 138 mmol/L (ref 134–144)
eGFR: 97 mL/min/{1.73_m2} (ref >59)

## 2024-04-03 LAB — LIPID PANEL
Chol/HDL Ratio: 3 ratio (ref 0.0–5.0)
Cholesterol, Total: 70 mg/dL — ABNORMAL LOW (ref 100–199)
HDL: 23 mg/dL — ABNORMAL LOW (ref >39)
LDL Chol Calc (NIH): 22 mg/dL (ref 0–99)
Triglycerides: 146 mg/dL (ref 0–149)
VLDL Cholesterol Cal: 25 mg/dL (ref 5–40)

## 2024-04-07 ENCOUNTER — Ambulatory Visit: Payer: Self-pay | Admitting: Internal Medicine

## 2024-05-07 ENCOUNTER — Encounter: Payer: Self-pay | Admitting: *Deleted

## 2024-05-12 ENCOUNTER — Other Ambulatory Visit: Payer: Self-pay

## 2024-05-12 NOTE — Telephone Encounter (Signed)
 FYI Only or Action Required?: Action required by provider: medication refill request.  Patient was last seen in primary care on 04/02/2024 by Lovie Clarity, MD.  Called Nurse Triage reporting No chief complaint on file..  Symptoms began today.  Interventions attempted: Nothing.  Symptoms are: stable.  Triage Disposition: No disposition on file.  Patient/caregiver understands and will follow disposition?:

## 2024-05-12 NOTE — Telephone Encounter (Unsigned)
 Copied from CRM [id number]. Topic: Clinical - Medication Refill >> May 12, 2024  9:57 AM Diannia H wrote: Medication: Accu-Chek Test Strips  Has the patient contacted their pharmacy? Yes (Agent: If no, request that the patient contact the pharmacy for the refill. If patient does not wish to contact the pharmacy document the reason why and proceed with request.) (Agent: If yes, when and what did the pharmacy advise?)  This is the patient's preferred pharmacy:  [id number] [place], [place] - [address] [facility] [place] [facility] [address] Phone: [phone] Fax: [phone]  Is this the correct pharmacy for this prescription? Yes If no, delete pharmacy and type the correct one.   Has the prescription been filled recently? No  Is the patient out of the medication? No  Has the patient been seen for an appointment in the last year OR does the patient have an upcoming appointment? Yes  Can we respond through MyChart? Yes  Agent: Please be advised that Rx refills may take up to 3 business days. We ask that you follow-up with your pharmacy.

## 2024-05-21 ENCOUNTER — Telehealth: Payer: Self-pay

## 2024-05-21 DIAGNOSIS — E1129 Type 2 diabetes mellitus with other diabetic kidney complication: Secondary | ICD-10-CM

## 2024-05-21 MED ORDER — ACCU-CHEK GUIDE TEST VI STRP: ORAL_STRIP | 12 refills | Status: AC

## 2024-05-21 NOTE — Telephone Encounter (Signed)
 Pt's sister requesting a new Rx for glucose blood (ACCU-CHEK GUIDE) test strip.

## 2024-05-21 NOTE — Telephone Encounter (Signed)
 Copied from CRM [id number]. Topic: Clinical - Medication Refill >> May 12, 2024  9:57 AM Diannia H wrote: Medication: Accu-Chek Test Strips  Has the patient contacted their pharmacy? Yes (Agent: If no, request that the patient contact the pharmacy for the refill. If patient does not wish to contact the pharmacy document the reason why and proceed with request.) (Agent: If yes, when and what did the pharmacy advise?)  This is the patient's preferred pharmacy:  [id number] [place], [place] - [address] [facility] [place] [address] Phone: [phone] Fax: [phone]  Is this the correct pharmacy for this prescription? Yes If no, delete pharmacy and type the correct one.   Has the prescription been filled recently? No  Is the patient out of the medication? No  Has the patient been seen for an appointment in the last year OR does the patient have an upcoming appointment? Yes  Can we respond through MyChart? Yes  Agent: Please be advised that Rx refills may take up to 3 business days. We ask that you follow-up with your pharmacy. >> May 21, 2024 11:32 AM Alfonso ORN wrote: Patient's sister calling on status of the renewal on test strip accu-chek the test strips in february   [id number], [place] - [address] [place]  [id number], Phone: [phone] Fax: [phone]  Ivin Pae (sister )call back [phone] or  cell number [phone] >> May 16, 2024 11:04 AM Susanna ORN wrote: Patient's sister, Clementina, called to follow up on Accu Check Guide Test Strips. Advised her that we are currently awaiting approval from provider. Please give her a call once this has been completed. She states he is out because the ones that he had expired in February.

## 2024-06-17 ENCOUNTER — Other Ambulatory Visit: Payer: Self-pay

## 2024-06-17 DIAGNOSIS — I10 Essential (primary) hypertension: Secondary | ICD-10-CM

## 2024-06-17 MED ORDER — METOPROLOL SUCCINATE ER 25 MG PO TB24
12.5000 mg | ORAL_TABLET | Freq: Every day | ORAL | 2 refills | Status: AC
Start: 2024-06-17 — End: ?

## 2024-06-17 NOTE — Telephone Encounter (Unsigned)
 Copied from CRM #[id number]. Topic: Clinical - Medication Refill >> Jun 17, 2024 11:43 AM Susanna ORN wrote: Medication: metoprolol  succinate (TOPROL -XL) 25 MG 24 hr tablet  Has the patient contacted their pharmacy? Yes, states they haven't received anything (Agent: If no, request that the patient contact the pharmacy for the refill. If patient does not wish to contact the pharmacy document the reason why and proceed with request.) (Agent: If yes, when and what did the pharmacy advise?)  This is the patient's preferred pharmacy:  [id number] [place], [place] - [address] [facility] [address] Phone: [phone] Fax: [phone]  Is this the correct pharmacy for this prescription? Yes If no, delete pharmacy and type the correct one.   Has the prescription been filled recently? No  Is the patient out of the medication? Yes  Has the patient been seen for an appointment in the last year OR does the patient have an upcoming appointment? Yes  Can we respond through MyChart? Yes  Agent: Please be advised that Rx refills may take up to 3 business days. We ask that you follow-up with your pharmacy.

## 2024-07-14 ENCOUNTER — Encounter: Payer: Self-pay | Admitting: Student

## 2024-07-14 ENCOUNTER — Ambulatory Visit (INDEPENDENT_AMBULATORY_CARE_PROVIDER_SITE_OTHER): Payer: Self-pay | Admitting: Student

## 2024-07-14 VITALS — BP 135/88 | HR 86 | Temp 97.7°F | Ht 62.0 in | Wt 126.2 lb

## 2024-07-14 DIAGNOSIS — Z79899 Other long term (current) drug therapy: Secondary | ICD-10-CM | POA: Diagnosis not present

## 2024-07-14 DIAGNOSIS — Z7984 Long term (current) use of oral hypoglycemic drugs: Secondary | ICD-10-CM

## 2024-07-14 DIAGNOSIS — Z833 Family history of diabetes mellitus: Secondary | ICD-10-CM

## 2024-07-14 DIAGNOSIS — E1129 Type 2 diabetes mellitus with other diabetic kidney complication: Secondary | ICD-10-CM | POA: Diagnosis not present

## 2024-07-14 DIAGNOSIS — R809 Proteinuria, unspecified: Secondary | ICD-10-CM

## 2024-07-14 DIAGNOSIS — Z0000 Encounter for general adult medical examination without abnormal findings: Secondary | ICD-10-CM

## 2024-07-14 DIAGNOSIS — Z7985 Long-term (current) use of injectable non-insulin antidiabetic drugs: Secondary | ICD-10-CM | POA: Diagnosis not present

## 2024-07-14 DIAGNOSIS — Z8249 Family history of ischemic heart disease and other diseases of the circulatory system: Secondary | ICD-10-CM | POA: Diagnosis not present

## 2024-07-14 DIAGNOSIS — I10 Essential (primary) hypertension: Secondary | ICD-10-CM

## 2024-07-14 LAB — POCT GLYCOSYLATED HEMOGLOBIN (HGB A1C): HbA1c, POC (controlled diabetic range): 6.3 % (ref 0.0–7.0)

## 2024-07-14 LAB — GLUCOSE, CAPILLARY: Glucose-Capillary: 99 mg/dL (ref 70–99)

## 2024-07-14 NOTE — Assessment & Plan Note (Signed)
 Status: stable. BP today 135/88. Reports taking HCTZ 12.5 mg, metoprolol  12.5 mg daily.  Last BMP in 03/2024 was normal. No acute concerns.   Plan -Continue: metoprolol  and hydrochlorothiazide   -At next visit, likely due for UACR, consider ARB and stopping BB in future if needed -BMP at future visit

## 2024-07-14 NOTE — Assessment & Plan Note (Addendum)
 Status: controlled. Last A1c of 7.7 in 03/2024.  A1c today is 6.3.  Currently taking metformin  1 g twice daily, Farxiga  10 mg, Januvia  100 mg. Brought in no sugar added PB container, discussed patient can have this.   Plan -Continue metformin , Farxiga , Januvia  -A1c in 3 months, if remains controlled extend to Q6 months -Ophthalmology exam: 01/2024 -Urine ACR normal  08/2023 -LDL 22 on 03/2024, taking atorvastatin  40 mg

## 2024-07-14 NOTE — Progress Notes (Signed)
 CC: T2DM follow-up  HPI: [name] is a 58 y.o. male living with a history stated below and presents today for T2DM follow-up. Please see problem based assessment and plan for additional details.  PMH: HTN, T2DM, glaucoma, intellectual disability, HLD Past Medical History:  Diagnosis Date   Acne rosacea, papular type 02/25/2016   Angioedema of lips 06/19/2018   Bilateral inguinal hernia    Bilateral inguinal hernia 12/13/2021   Essential hypertension 08/18/2006   Fatigue 08/02/2021   Gastroesophageal reflux disease 09/27/2012   Intermittent symptoms, does not want therapy    Healthcare maintenance 09/27/2012   Hyperlipidemia LDL goal < 100 05/03/2007   Mental retardation 08/18/2006   Open-angle glaucoma 07/25/2013   Overweight (BMI 25.0-29.9) 09/27/2012   Type 2 diabetes mellitus with proteinuria or microalbuminuria 08/18/2006    Current Outpatient Medications on File Prior to Visit  Medication Sig Dispense Refill   Accu-Chek FastClix Lancets MISC check blood sugar up to 1 time a day as instructed 102 each 5   atorvastatin  (LIPITOR) 40 MG tablet TAKE 1 TABLET BY MOUTH EVERY DAY 90 tablet 3   Blood Glucose Monitoring Suppl (ACCU-CHEK GUIDE) w/Device KIT 1 each by Does not apply route daily. check blood sugar up to 1 time a day as instructed. 1 kit 1   cetirizine  (ZYRTEC  ALLERGY ) 10 MG tablet Take 1 tablet by mouth once daily 30 tablet 5   dapagliflozin  propanediol (FARXIGA ) 10 MG TABS tablet Take 1 tablet (10 mg total) by mouth daily before breakfast. 90 tablet 3   glucose blood (ACCU-CHEK GUIDE TEST) test strip Check blood sugars daily. 100 each 12   glucose blood (ACCU-CHEK GUIDE) test strip Check blood sugars daily 100 strip 5   glucose blood test strip Use as instructed 100 each 12   hydrochlorothiazide  (MICROZIDE ) 12.5 MG capsule TAKE 1 CAPSULE BY MOUTH EVERY DAY 90 capsule 3   JANUVIA  100 MG tablet TAKE 1 TABLET BY MOUTH EVERY DAY 90 tablet 3   latanoprost   (XALATAN ) 0.005 % ophthalmic solution Place 1 drop into both eyes at bedtime.     metFORMIN  (GLUCOPHAGE ) 1000 MG tablet Take 1 tablet (1,000 mg total) by mouth 2 (two) times daily with a meal. 180 tablet 3   metoprolol  succinate (TOPROL -XL) 25 MG 24 hr tablet Take 0.5 tablets (12.5 mg total) by mouth daily. 45 tablet 2   metroNIDAZOLE  (METROCREAM ) 0.75 % cream Apply 1 Application topically daily as needed (rosacea). 45 g 1   No current facility-administered medications on file prior to visit.    Family History  Problem Relation Age of Onset   Hyperlipidemia Mother    Hypertension Mother    Liver cancer Mother    Diabetes Father    Heart attack Father    Cancer Sister        liver   Cancer Brother        lung    Social History   Socioeconomic History   Marital status: Single    Spouse name: Not on file   Number of children: 0   Years of education: Not on file   Highest education level: Not on file  Occupational History   Not on file  Tobacco Use   Smoking status: Never   Smokeless tobacco: Never  Vaping Use   Vaping status: Never Used  Substance and Sexual Activity   Alcohol use: No    Alcohol/week: 0.0 standard drinks of alcohol   Drug use: No   Sexual activity:  Never  Other Topics Concern   Not on file  Social History Narrative   Current Social History 10/31/2021        Patient lives with his sister in a home which is 1 story. There are 4 steps up to the entrance the patient uses with a railing      Patient's method of transportation is via family member.      The highest level of education was some high school.      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      Identified important Relationships are his family      Pets : 0       Interests / Fun: Sports, watch TV, car shows       Current Stressors: none       Religious / Personal Beliefs: none       Social Drivers of Corporate investment banker Strain: Low Risk  (03/12/2024)    Overall Financial Resource Strain (CARDIA)    Difficulty of Paying Living Expenses: Not hard at all  Food Insecurity: No Food Insecurity (03/12/2024)   Hunger Vital Sign    Worried About Running Out of Food in the Last Year: Never true    Ran Out of Food in the Last Year: Never true  Transportation Needs: No Transportation Needs (03/12/2024)   PRAPARE - Administrator, Civil Service (Medical): No    Lack of Transportation (Non-Medical): No  Physical Activity: Insufficiently Active (03/12/2024)   Exercise Vital Sign    Days of Exercise per Week: 7 days    Minutes of Exercise per Session: 20 min  Stress: No Stress Concern Present (03/12/2024)   [employer] of Occupational Health - Occupational Stress Questionnaire    Feeling of Stress : Not at all  Social Connections: Socially Isolated (03/12/2024)   Social Connection and Isolation Panel    Frequency of Communication with Friends and Family: More than three times a week    Frequency of Social Gatherings with Friends and Family: More than three times a week    Attends Religious Services: Never    Database administrator or Organizations: No    Attends Banker Meetings: Never    Marital Status: Never married  Intimate Partner Violence: Not At Risk (03/12/2024)   Humiliation, Afraid, Rape, and Kick questionnaire    Fear of Current or Ex-Partner: No    Emotionally Abused: No    Physically Abused: No    Sexually Abused: No    Review of Systems: ROS negative except for what is noted on the assessment and plan.  Vitals:   07/14/24 1409  BP: 135/88  Pulse: 86  Temp: 97.7 F (36.5 C)  TempSrc: Oral  SpO2: 95%  Weight: 126 lb 3.2 oz (57.2 kg)  Height: 5' 2 (1.575 m)   Physical Exam: Constitutional: well-appearing male sitting in chair, in no acute distress Cardiovascular: regular rate and rhythm Pulmonary/Chest: normal work of breathing on room air Neurological: alert & oriented x 3 Skin: warm and  dry  Assessment & Plan:   Essential hypertension Status: stable. BP today 135/88. Reports taking HCTZ 12.5 mg, metoprolol  12.5 mg daily.  Last BMP in 03/2024 was normal. No acute concerns.   Plan -Continue: metoprolol  and hydrochlorothiazide   -At next visit, likely due for UACR, consider ARB and stopping BB in future if needed -BMP at future visit   Type 2 diabetes mellitus with microalbuminuria, without long-term current use  of insulin  (HCC) Status: controlled. Last A1c of 7.7 in 03/2024.  A1c today is 6.3.  Currently taking metformin  1 g twice daily, Farxiga  10 mg, Januvia  100 mg. Brought in no sugar added PB container, discussed patient can have this.   Plan -Continue metformin , Farxiga , Januvia  -A1c in 3 months, if remains controlled extend to Q6 months -Ophthalmology exam: 01/2024 -Urine ACR normal  08/2023 -LDL 22 on 03/2024, taking atorvastatin  40 mg  Healthcare maintenance Plan to get flu shot at local pharmacy.    Patient discussed with Dr. Machen  Sherrelle Prochazka, D.O. [facility] Internal Medicine, PGY-3 Phone: [phone] Date 07/14/2024 Time 3:31 PM

## 2024-07-14 NOTE — Patient Instructions (Signed)
 Thank you, [name] for allowing us  to provide your care today. Today we discussed:  -A1c improved to 6.3%! Continue with your metformin , Farxiga  and Januvia . Ok to eat no sugar added peanut butter.  -Plan to see you back in 3-4 months for follow up.  -You can get the flu shot at your local pharmacy.    Follow up: 3-4 months   Should you have any questions or concerns please call the internal medicine clinic at [phone].    Riki Gehring, D.O. [facility]

## 2024-07-14 NOTE — Assessment & Plan Note (Signed)
 Plan to get flu shot at local pharmacy.

## 2024-07-15 NOTE — Progress Notes (Signed)
 Internal Medicine Clinic Attending  Case discussed with the resident at the time of the visit.  We reviewed the resident's history and exam and pertinent patient test results.  I agree with the assessment, diagnosis, and plan of care documented in the resident's note.

## 2024-07-22 ENCOUNTER — Telehealth: Payer: Self-pay

## 2024-07-22 NOTE — Telephone Encounter (Signed)
 I called pt's sister Byron Pae) at the number below, no answer. I left message to call the clinic back.

## 2024-07-22 NOTE — Telephone Encounter (Signed)
 Copied from CRM [id number]. Topic: Appointments - Scheduling Inquiry for Clinic >> Jul 21, 2024  2:14 PM Zane F wrote:  Caller: Clementina Pae ( Patient sister)  Calling to schedule the patient 3 month follow up to recheck his glucose levels. His PCP nor Dr. Elicia templates are available for December. Please call patient's sister, Clementina to schedule when they become available.   Callback Number: [phone]

## 2024-08-06 DIAGNOSIS — Z23 Encounter for immunization: Secondary | ICD-10-CM | POA: Diagnosis not present

## 2024-10-21 ENCOUNTER — Telehealth: Payer: Self-pay | Admitting: *Deleted

## 2024-10-21 ENCOUNTER — Other Ambulatory Visit: Payer: Self-pay

## 2024-10-21 DIAGNOSIS — E785 Hyperlipidemia, unspecified: Secondary | ICD-10-CM

## 2024-10-21 NOTE — Telephone Encounter (Unsigned)
 Copied from CRM #[id number]. Topic: Clinical - Medication Refill >> Oct 21, 2024 10:36 AM Chiquita SQUIBB wrote: Medication: atorvastatin  (LIPITOR) 40 MG tablet  Has the patient contacted their pharmacy? Yes (Agent: If no, request that the patient contact the pharmacy for the refill. If patient does not wish to contact the pharmacy document the reason why and proceed with request.) (Agent: If yes, when and what did the pharmacy advise?)  This is the patient's preferred pharmacy:  [id number] [place], [place] - [address] [facility] [address] Phone: [phone] Fax: [phone]  Is this the correct pharmacy for this prescription? Yes If no, delete pharmacy and type the correct one.   Has the prescription been filled recently? No  Is the patient out of the medication? No  Has the patient been seen for an appointment in the last year OR does the patient have an upcoming appointment? Yes  Can we respond through MyChart? Yes  Agent: Please be advised that Rx refills may take up to 3 business days. We ask that you follow-up with your pharmacy.

## 2024-10-21 NOTE — Telephone Encounter (Signed)
 RTC to patient's sister informed her that patient usually comes in every 3 months for an appointment and gets a HbA1C.  She will need to call and schedule patient's appointment and then a HbA1C can be collected. Copied from CRM #[id number]. Topic: Clinical - Request for Lab/Test Order >> Oct 21, 2024 10:37 AM Chiquita SQUIBB wrote: Reason for CRM: Patients sister is calling in to have his A1C blood work done, she stated his rhona showed he was due for it but no orders are in. Please call Leila at [phone]

## 2024-10-22 ENCOUNTER — Ambulatory Visit: Payer: Self-pay | Admitting: Student

## 2024-10-22 ENCOUNTER — Telehealth: Payer: Self-pay

## 2024-10-22 MED ORDER — ATORVASTATIN CALCIUM 40 MG PO TABS: 40.0000 mg | ORAL_TABLET | Freq: Every day | ORAL | 3 refills | Status: AC

## 2024-10-22 NOTE — Telephone Encounter (Signed)
 Attempted to contact patient because appointment on 10/24/2024 has been cancelled due to Dr. Shawn will not be in the office.  Left detailed message asking patient to give the office a call back to reschedule.

## 2024-10-24 ENCOUNTER — Ambulatory Visit: Payer: Self-pay

## 2024-11-12 ENCOUNTER — Telehealth: Payer: Self-pay | Admitting: *Deleted

## 2024-11-12 DIAGNOSIS — E1129 Type 2 diabetes mellitus with other diabetic kidney complication: Secondary | ICD-10-CM

## 2024-11-12 MED ORDER — SITAGLIPTIN PHOSPHATE 100 MG PO TABS: 100.0000 mg | ORAL_TABLET | Freq: Every day | ORAL | 0 refills | Status: AC

## 2024-11-12 NOTE — Telephone Encounter (Signed)
 Copied from CRM [id number]. Topic: Clinical - Medication Refill >> Nov 12, 2024 11:49 AM Chiquita SQUIBB wrote: Medication: JANUVIA  100 MG tablet   Has the patient contacted their pharmacy? Yes (Agent: If no, request that the patient contact the pharmacy for the refill. If patient does not wish to contact the pharmacy document the reason why and proceed with request.) (Agent: If yes, when and what did the pharmacy advise?)  This is the patient's preferred pharmacy:  [id number] [place], [place] - [address] [facility] [address] Phone: [phone] Fax: [phone]  Is this the correct pharmacy for this prescription? Yes If no, delete pharmacy and type the correct one.   Has the prescription been filled recently? No  Is the patient out of the medication? No  Has the patient been seen for an appointment in the last year OR does the patient have an upcoming appointment? Yes  Can we respond through MyChart? Yes  Agent: Please be advised that Rx refills may take up to 3 business days. We ask that you follow-up with your pharmacy.

## 2024-11-26 ENCOUNTER — Ambulatory Visit

## 2024-12-03 ENCOUNTER — Ambulatory Visit

## 2025-03-18 ENCOUNTER — Ambulatory Visit
